# Patient Record
Sex: Female | Born: 1955 | Race: White | Hispanic: No | Marital: Single | Smoking: Current some day smoker
Health system: Southern US, Community
[De-identification: ages and names within clinical notes are randomized; demographics above are authoritative.]

## PROBLEM LIST (undated history)

## (undated) DIAGNOSIS — E039 Hypothyroidism, unspecified: Secondary | ICD-10-CM

## (undated) DIAGNOSIS — F419 Anxiety disorder, unspecified: Secondary | ICD-10-CM

## (undated) DIAGNOSIS — I219 Acute myocardial infarction, unspecified: Secondary | ICD-10-CM

## (undated) DIAGNOSIS — G43909 Migraine, unspecified, not intractable, without status migrainosus: Secondary | ICD-10-CM

## (undated) DIAGNOSIS — M25471 Effusion, right ankle: Secondary | ICD-10-CM

## (undated) DIAGNOSIS — K8020 Calculus of gallbladder without cholecystitis without obstruction: Secondary | ICD-10-CM

## (undated) DIAGNOSIS — M25472 Effusion, left ankle: Secondary | ICD-10-CM

## (undated) DIAGNOSIS — I341 Nonrheumatic mitral (valve) prolapse: Secondary | ICD-10-CM

## (undated) DIAGNOSIS — F329 Major depressive disorder, single episode, unspecified: Secondary | ICD-10-CM

## (undated) DIAGNOSIS — I83892 Varicose veins of left lower extremities with other complications: Secondary | ICD-10-CM

## (undated) DIAGNOSIS — F32A Depression, unspecified: Secondary | ICD-10-CM

## (undated) DIAGNOSIS — M1990 Unspecified osteoarthritis, unspecified site: Secondary | ICD-10-CM

## (undated) DIAGNOSIS — K52831 Collagenous colitis: Secondary | ICD-10-CM

## (undated) DIAGNOSIS — N200 Calculus of kidney: Secondary | ICD-10-CM

## (undated) DIAGNOSIS — J189 Pneumonia, unspecified organism: Secondary | ICD-10-CM

## (undated) DIAGNOSIS — T7840XA Allergy, unspecified, initial encounter: Secondary | ICD-10-CM

## (undated) DIAGNOSIS — Z87442 Personal history of urinary calculi: Secondary | ICD-10-CM

## (undated) HISTORY — PX: BARTHOLIN GLAND CYST EXCISION: SHX565

## (undated) HISTORY — DX: Pneumonia, unspecified organism: J18.9

## (undated) HISTORY — DX: Hypothyroidism, unspecified: E03.9

## (undated) HISTORY — PX: UPPER GI ENDOSCOPY: SHX6162

## (undated) HISTORY — DX: Depression, unspecified: F32.A

## (undated) HISTORY — DX: Anxiety disorder, unspecified: F41.9

## (undated) HISTORY — DX: Migraine, unspecified, not intractable, without status migrainosus: G43.909

## (undated) HISTORY — DX: Unspecified osteoarthritis, unspecified site: M19.90

## (undated) HISTORY — DX: Allergy, unspecified, initial encounter: T78.40XA

## (undated) HISTORY — PX: COLONOSCOPY: SHX174

## (undated) HISTORY — PX: SALPINGECTOMY: SHX328

---
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
## 2004-06-06 HISTORY — PX: TOTAL ABDOMINAL HYSTERECTOMY: SHX209

## 2016-06-06 HISTORY — PX: HIP RESURFACING: SHX1760

## 2018-01-19 DIAGNOSIS — Z760 Encounter for issue of repeat prescription: Secondary | ICD-10-CM | POA: Diagnosis not present

## 2018-03-29 DIAGNOSIS — M7502 Adhesive capsulitis of left shoulder: Secondary | ICD-10-CM | POA: Diagnosis not present

## 2018-03-29 DIAGNOSIS — M25512 Pain in left shoulder: Secondary | ICD-10-CM | POA: Diagnosis not present

## 2018-04-10 DIAGNOSIS — M542 Cervicalgia: Secondary | ICD-10-CM | POA: Diagnosis not present

## 2018-04-10 DIAGNOSIS — M7502 Adhesive capsulitis of left shoulder: Secondary | ICD-10-CM | POA: Diagnosis not present

## 2018-04-21 DIAGNOSIS — M542 Cervicalgia: Secondary | ICD-10-CM | POA: Diagnosis not present

## 2018-04-26 DIAGNOSIS — M542 Cervicalgia: Secondary | ICD-10-CM | POA: Diagnosis not present

## 2018-04-26 DIAGNOSIS — M7502 Adhesive capsulitis of left shoulder: Secondary | ICD-10-CM | POA: Diagnosis not present

## 2018-05-04 DIAGNOSIS — N132 Hydronephrosis with renal and ureteral calculous obstruction: Secondary | ICD-10-CM | POA: Diagnosis not present

## 2018-05-04 DIAGNOSIS — R109 Unspecified abdominal pain: Secondary | ICD-10-CM | POA: Diagnosis not present

## 2018-05-04 DIAGNOSIS — N200 Calculus of kidney: Secondary | ICD-10-CM | POA: Diagnosis not present

## 2018-05-04 DIAGNOSIS — Z79899 Other long term (current) drug therapy: Secondary | ICD-10-CM | POA: Diagnosis not present

## 2018-05-16 DIAGNOSIS — M7502 Adhesive capsulitis of left shoulder: Secondary | ICD-10-CM | POA: Diagnosis not present

## 2018-05-24 DIAGNOSIS — M542 Cervicalgia: Secondary | ICD-10-CM | POA: Diagnosis not present

## 2018-05-24 DIAGNOSIS — M25512 Pain in left shoulder: Secondary | ICD-10-CM | POA: Diagnosis not present

## 2018-05-24 DIAGNOSIS — M7502 Adhesive capsulitis of left shoulder: Secondary | ICD-10-CM | POA: Diagnosis not present

## 2018-06-04 DIAGNOSIS — M25512 Pain in left shoulder: Secondary | ICD-10-CM | POA: Diagnosis not present

## 2018-06-05 DIAGNOSIS — M5030 Other cervical disc degeneration, unspecified cervical region: Secondary | ICD-10-CM | POA: Diagnosis not present

## 2018-06-14 DIAGNOSIS — M7502 Adhesive capsulitis of left shoulder: Secondary | ICD-10-CM | POA: Diagnosis not present

## 2018-06-14 DIAGNOSIS — M7542 Impingement syndrome of left shoulder: Secondary | ICD-10-CM | POA: Diagnosis not present

## 2018-09-28 DIAGNOSIS — E079 Disorder of thyroid, unspecified: Secondary | ICD-10-CM | POA: Diagnosis not present

## 2018-09-28 DIAGNOSIS — G43009 Migraine without aura, not intractable, without status migrainosus: Secondary | ICD-10-CM | POA: Diagnosis not present

## 2019-02-05 ENCOUNTER — Encounter: Payer: Self-pay | Admitting: Gastroenterology

## 2019-02-05 DIAGNOSIS — E039 Hypothyroidism, unspecified: Secondary | ICD-10-CM | POA: Diagnosis not present

## 2019-02-05 DIAGNOSIS — F411 Generalized anxiety disorder: Secondary | ICD-10-CM | POA: Diagnosis not present

## 2019-02-05 DIAGNOSIS — G43909 Migraine, unspecified, not intractable, without status migrainosus: Secondary | ICD-10-CM | POA: Diagnosis not present

## 2019-02-05 DIAGNOSIS — F331 Major depressive disorder, recurrent, moderate: Secondary | ICD-10-CM | POA: Diagnosis not present

## 2019-02-20 ENCOUNTER — Encounter (INDEPENDENT_AMBULATORY_CARE_PROVIDER_SITE_OTHER): Payer: Self-pay

## 2019-02-20 ENCOUNTER — Ambulatory Visit: Payer: BC Managed Care – PPO | Admitting: Gastroenterology

## 2019-02-20 ENCOUNTER — Encounter: Payer: Self-pay | Admitting: Gastroenterology

## 2019-02-20 ENCOUNTER — Other Ambulatory Visit (INDEPENDENT_AMBULATORY_CARE_PROVIDER_SITE_OTHER): Payer: BC Managed Care – PPO

## 2019-02-20 VITALS — BP 90/60 | HR 88 | Temp 97.7°F | Ht 68.5 in | Wt 113.1 lb

## 2019-02-20 DIAGNOSIS — K529 Noninfective gastroenteritis and colitis, unspecified: Secondary | ICD-10-CM | POA: Diagnosis not present

## 2019-02-20 DIAGNOSIS — K52831 Collagenous colitis: Secondary | ICD-10-CM | POA: Diagnosis not present

## 2019-02-20 LAB — COMPREHENSIVE METABOLIC PANEL
ALT: 10 U/L (ref 0–35)
AST: 13 U/L (ref 0–37)
Albumin: 4.5 g/dL (ref 3.5–5.2)
Alkaline Phosphatase: 76 U/L (ref 39–117)
BUN: 11 mg/dL (ref 6–23)
CO2: 20 mEq/L (ref 19–32)
Calcium: 9.4 mg/dL (ref 8.4–10.5)
Chloride: 108 mEq/L (ref 96–112)
Creatinine, Ser: 0.77 mg/dL (ref 0.40–1.20)
GFR: 76.15 mL/min (ref >60.00)
Glucose, Bld: 73 mg/dL (ref 70–99)
Potassium: 3.5 mEq/L (ref 3.5–5.1)
Sodium: 139 mEq/L (ref 135–145)
Total Bilirubin: 0.5 mg/dL (ref 0.2–1.2)
Total Protein: 7.6 g/dL (ref 6.0–8.3)

## 2019-02-20 LAB — CBC WITH DIFFERENTIAL/PLATELET
Basophils Absolute: 0.1 10*3/uL (ref 0.0–0.1)
Basophils Relative: 1.2 % (ref 0.0–3.0)
Eosinophils Absolute: 0.1 10*3/uL (ref 0.0–0.7)
Eosinophils Relative: 0.8 % (ref 0.0–5.0)
HCT: 42.8 % (ref 36.0–46.0)
Hemoglobin: 14.5 g/dL (ref 12.0–15.0)
Lymphocytes Relative: 28 % (ref 12.0–46.0)
Lymphs Abs: 2.2 10*3/uL (ref 0.7–4.0)
MCHC: 33.8 g/dL (ref 30.0–36.0)
MCV: 99.2 fl (ref 78.0–100.0)
Monocytes Absolute: 0.7 10*3/uL (ref 0.1–1.0)
Monocytes Relative: 9.5 % (ref 3.0–12.0)
Neutro Abs: 4.7 10*3/uL (ref 1.4–7.7)
Neutrophils Relative %: 60.5 % (ref 43.0–77.0)
Platelets: 265 10*3/uL (ref 150.0–400.0)
RBC: 4.31 Mil/uL (ref 3.87–5.11)
RDW: 13.4 % (ref 11.5–15.5)
WBC: 7.7 10*3/uL (ref 4.0–10.5)

## 2019-02-20 LAB — IGA: IgA: 292 mg/dL (ref 68–378)

## 2019-02-20 MED ORDER — BUDESONIDE 3 MG PO CPEP: ORAL_CAPSULE | ORAL | 5 refills | Status: DC

## 2019-02-20 NOTE — Progress Notes (Signed)
HPI: This is a very pleasant 63 year old woman who was referred to me by Lewis Moccasinewey, Elizabeth R, MD  to evaluate chronic diarrhea, collagenous colitis.    Chief complaint is chronic diarrhea, collagenous colitis  She was diagnosed with collagenous colitis 3 or 4 years ago while [place]  She was having significant diarrhea, abdominal cramping.  She tells me she had a colonoscopy.  We do not have those results at the time of this visit.  She was started on budesonide at 3 pills once daily and did improve.  She tapered her dose over time so that she was taking 1 pill twice a week.  In the past 6 months her diarrhea has returned, she is having incontinent episodes.  Never bloody diarrhea.  She keeps a pad in her car and spare underpants and shorts because she has incontinence so often.  In the past 6 months she is taking 1 budesonide pill 3 times per week.  She has not been on antibiotics in 6 months.  She has lost about 20 pounds in the past 8 to 10 months.  She is not eating because whenever she does she will have to run to the bathroom very quickly.  No fevers or chills  Colon cancer does not run in her family  Review of systems: Pertinent positive and negative review of systems were noted in the above HPI section. All other review negative.   Past Medical History:  Diagnosis Date  . Anxiety   . Depression   . Hypothyroidism   . Migraines     History reviewed. No pertinent surgical history.  Current Outpatient Medications  Medication Sig Dispense Refill  . budesonide (ENTOCORT EC) 3 MG 24 hr capsule Take 3 mg by mouth. Three times a week    . Cholecalciferol (VITAMIN D3 PO) Take 5,000 Units by mouth daily.     . citalopram (CELEXA) 40 MG tablet Take 40 mg by mouth daily.    . cyanocobalamin 2000 MCG tablet Take 2,000 mcg by mouth daily.    [name] Kitchen. estradiol (ESTRACE) 0.5 MG tablet Take 0.5 tablets by mouth daily.    [name] Kitchen. levothyroxine (SYNTHROID) 75 MCG tablet Take  75 mcg by mouth daily before breakfast.    . Multiple Vitamin (MULTIVITAMIN) tablet Take 1 tablet by mouth daily.    [name] Kitchen. topiramate (TOPAMAX) 100 MG tablet Take 100 mg by mouth 2 (two) times daily.    [name] Kitchen. ZOLMitriptan (ZOMIG) 2.5 MG tablet Take 2.5 mg by mouth once. May repeat in 2 hours if headache persists or recurs.     No current facility-administered medications for this visit.     Allergies as of 02/20/2019 - Review Complete 02/20/2019  Allergen Reaction Noted  . Sulfa antibiotics  02/20/2019    History reviewed. No pertinent family history.  Social History   Socioeconomic History  . Marital status: Single    Spouse name: Not on file  . Number of children: Not on file  . Years of education: Not on file  . Highest education level: Not on file  Occupational History  . Not on file  Social Needs  . Financial resource strain: Not on file  . Food insecurity    Worry: Not on file    Inability: Not on file  . Transportation needs    Medical: Not on file    Non-medical: Not on file  Tobacco Use  . Smoking status: Not on file  Substance and Sexual Activity  .  Alcohol use: Not on file  . Drug use: Not on file  . Sexual activity: Not on file  Lifestyle  . Physical activity    Days per week: Not on file    Minutes per session: Not on file  . Stress: Not on file  Relationships  . Social Herbalist on phone: Not on file    Gets together: Not on file    Attends religious service: Not on file    Active member of club or organization: Not on file    Attends meetings of clubs or organizations: Not on file    Relationship status: Not on file  . Intimate partner violence    Fear of current or ex partner: Not on file    Emotionally abused: Not on file    Physically abused: Not on file    Forced sexual activity: Not on file  Other Topics Concern  . Not on file  Social History Narrative  . Not on file     Physical Exam: Temp 97.7 F (36.5 C)   Ht 5' 8.5" (1.74  m) Comment: height measured without shoes  Wt 113 lb 2 oz (51.3 kg)   BMI 16.95 kg/m  Constitutional: Cachectic Psychiatric: alert and oriented x3 Eyes: extraocular movements intact Mouth: oral pharynx moist, no lesions Neck: supple no lymphadenopathy Cardiovascular: heart regular rate and rhythm Lungs: clear to auscultation bilaterally Abdomen: soft, nontender, nondistended, no obvious ascites, no peritoneal signs, normal bowel sounds Extremities: no lower extremity edema bilaterally Skin: no lesions on visible extremities   Assessment and plan: 63 y.o. female with chronic diarrhea, history of collagenous colitis  Most likely this is uncontrolled collagenous colitis but I would like to make sure nothing else is going on with a battery of blood test and stool test.  She will get CBC, complete metabolic profile, celiac sprue serologies, GI pathogen panel.  We will send away for records from her previous gastroenterologist in [place] for review here.  I am going to start her on budesonide 3 pills once a day which is standard dose.  She will return to see me in 6 weeks and sooner if any problems.    Please see the "Patient Instructions" section for addition details about the plan.   Owens Loffler, MD [facility] 02/20/2019, 8:34 AM  Cc: Fanny Bien, MD

## 2019-02-20 NOTE — Patient Instructions (Signed)
Your provider has requested that you go to the basement level for lab work before leaving today. Press "B" on the elevator. The lab is located at the first door on the left as you exit the elevator.  We have sent the following medications to your pharmacy for you to pick up at your convenience:  Follow up on 04/02/19 at 910am  Thank you for entrusting me with your care and choosing [facility].  Dr Ardis Hughs

## 2019-02-21 DIAGNOSIS — H6090 Unspecified otitis externa, unspecified ear: Secondary | ICD-10-CM | POA: Diagnosis not present

## 2019-02-21 DIAGNOSIS — E539 Vitamin B deficiency, unspecified: Secondary | ICD-10-CM | POA: Diagnosis not present

## 2019-02-21 DIAGNOSIS — H9193 Unspecified hearing loss, bilateral: Secondary | ICD-10-CM | POA: Diagnosis not present

## 2019-02-21 DIAGNOSIS — K52831 Collagenous colitis: Secondary | ICD-10-CM | POA: Diagnosis not present

## 2019-02-21 DIAGNOSIS — Z23 Encounter for immunization: Secondary | ICD-10-CM | POA: Diagnosis not present

## 2019-02-21 DIAGNOSIS — E559 Vitamin D deficiency, unspecified: Secondary | ICD-10-CM | POA: Diagnosis not present

## 2019-02-21 DIAGNOSIS — R634 Abnormal weight loss: Secondary | ICD-10-CM | POA: Diagnosis not present

## 2019-02-21 DIAGNOSIS — E039 Hypothyroidism, unspecified: Secondary | ICD-10-CM | POA: Diagnosis not present

## 2019-02-21 LAB — TISSUE TRANSGLUTAMINASE, IGA: (tTG) Ab, IgA: 1 U/mL

## 2019-02-25 DIAGNOSIS — K52831 Collagenous colitis: Secondary | ICD-10-CM | POA: Diagnosis not present

## 2019-02-25 DIAGNOSIS — R634 Abnormal weight loss: Secondary | ICD-10-CM | POA: Diagnosis not present

## 2019-02-25 DIAGNOSIS — G43909 Migraine, unspecified, not intractable, without status migrainosus: Secondary | ICD-10-CM | POA: Diagnosis not present

## 2019-02-25 DIAGNOSIS — E039 Hypothyroidism, unspecified: Secondary | ICD-10-CM | POA: Diagnosis not present

## 2019-03-06 ENCOUNTER — Telehealth: Payer: Self-pay | Admitting: Gastroenterology

## 2019-03-06 NOTE — Telephone Encounter (Signed)
The pt has been notified of the information below.  The pt has been advised of the information and verbalized understanding.

## 2019-03-06 NOTE — Telephone Encounter (Signed)
Left message on machine to call back  

## 2019-03-06 NOTE — Telephone Encounter (Signed)
   Reviewed outside records from [name] Dr. Arnoldo Morale gastroenterologist  EGD September 2018 for melena was normal. EGD August 2016  showed mild gastritis biopsies of the stomach and duodenum were normal.  Biopsies were negative for H. pylori or celiac sprue.  Colonoscopy August 2016 for diarrhea and weight loss showed normal mucosa throughout except for scattered diverticulosis.  Pathology showed "collagenous colitis".  Colonoscopy October 2011 for lower abdominal pain and diarrhea.  Examination was essentially normal.  Biopsies were taken from the right and left colon and were all completely normal on pathology..   Can you please contact her and let her know that I reviewed her [name] records proving collagenous colitis.  She should continue with the recommendations that we outlined at her recent visit.

## 2019-04-02 ENCOUNTER — Ambulatory Visit: Payer: BC Managed Care – PPO | Admitting: Gastroenterology

## 2019-04-02 ENCOUNTER — Encounter: Payer: Self-pay | Admitting: Gastroenterology

## 2019-04-02 VITALS — BP 90/56 | HR 83 | Temp 98.3°F | Ht 68.5 in | Wt 113.6 lb

## 2019-04-02 DIAGNOSIS — K52831 Collagenous colitis: Secondary | ICD-10-CM | POA: Diagnosis not present

## 2019-04-02 MED ORDER — BUDESONIDE 3 MG PO CPEP: ORAL_CAPSULE | ORAL | 6 refills | Status: DC

## 2019-04-02 NOTE — Patient Instructions (Signed)
If you are age 61 or older, your body mass index should be between 23-30. Your Body mass index is 17.02 kg/m. If this is out of the aforementioned range listed, please consider follow up with your Primary Care Provider.  If you are age 23 or younger, your body mass index should be between 19-25. Your Body mass index is 17.02 kg/m. If this is out of the aformentioned range listed, please consider follow up with your Primary Care Provider.    We have sent the following medications to your pharmacy for you to pick up at your convenience:  You have a follow up appointment in 2 months  Thank you for entrusting me with your care and choosing [facility].  Dr Ardis Hughs

## 2019-04-02 NOTE — Progress Notes (Signed)
Review of pertinent gastrointestinal problems: 1. Collagenous colitis; colonoscopy August 2016 in [name], Dr. Arnoldo Morale for diarrhea and weight loss showed normal mucosa throughout except for scattered diverticulosis.  Random biopsies showed "collagenous colitis".  Establish care [facility] gastroenterology September 2020.  Lab tests including CBC, complete metabolic profile, celiac sprue testing, were all normal.  Started budesonide 9 mg once daily.    Other GI procedures Dr. Arnoldo Morale: EGD September 2018 for melena was normal. EGD August 2016  showed mild gastritis biopsies of the stomach and duodenum were normal.  Biopsies were negative for H. pylori or celiac sprue. Colonoscopy October 2011 for lower abdominal pain and diarrhea.  Examination was essentially normal.  Biopsies were taken from the right and left colon and were all completely normal on pathology.   HPI: This is a very pleasant 63 year old woman whom I last saw about 6 weeks ago here in the office.  At that time I started her on Entocort 9 mg once daily for previously diagnosed collagenous colitis.  She has responded very well.  The urgency is gone.  The nocturnal diarrhea is gone, the incontinence episodes are gone.  She moves her bowels 3 or 4 times a day which is actually quite improved from previously.  She does still have some cramping at times but overall she is quite happy with the results.  Chief complaint is collagenous colitis  ROS: complete GI ROS as described in HPI, all other review negative.  Constitutional:  No unintentional weight loss   Past Medical History:  Diagnosis Date  . Anxiety   . Arthritis   . Depression   . Hypothyroidism   . Kidney stones   . Migraines   . Pneumonia     Past Surgical History:  Procedure Laterality Date  . HIP RESURFACING Left 2018  . TOTAL ABDOMINAL HYSTERECTOMY  2006    Current Outpatient Medications  Medication Sig Dispense Refill  . budesonide (ENTOCORT  EC) 3 MG 24 hr capsule Take 3 tablets daily 90 capsule 5  . Cholecalciferol (VITAMIN D3 PO) Take 5,000 Units by mouth daily.     . citalopram (CELEXA) 40 MG tablet Take 40 mg by mouth daily.    . cyanocobalamin 2000 MCG tablet Take 2,000 mcg by mouth daily.    [name] Kitchen estradiol (ESTRACE) 0.5 MG tablet Take 0.5 tablets by mouth daily.    [name] Kitchen levothyroxine (SYNTHROID) 75 MCG tablet Take 75 mcg by mouth daily before breakfast.    . Multiple Vitamin (MULTIVITAMIN) tablet Take 1 tablet by mouth daily.    [name] Kitchen topiramate (TOPAMAX) 100 MG tablet Take 100 mg by mouth 2 (two) times daily.    [name] Kitchen ZOLMitriptan (ZOMIG) 2.5 MG tablet Take 2.5 mg by mouth once. May repeat in 2 hours if headache persists or recurs.     No current facility-administered medications for this visit.     Allergies as of 04/02/2019 - Review Complete 02/20/2019  Allergen Reaction Noted  . Sulfa antibiotics  02/20/2019    Family History  Problem Relation Age of Onset  . Heart disease Mother   . Dementia Mother   . Arthritis Mother   . Bladder Cancer Father   . Diabetes Father   . Heart disease Father   . Arthritis Father   . Diabetes Sister   . Arthritis Sister   . Dementia Maternal Grandmother   . Heart disease Maternal Grandfather   . Diabetes Paternal Grandfather   . Diabetes Sister   . Arthritis Sister   .  Arthritis Sister   . Dementia Maternal Aunt        x 2    Social History   Socioeconomic History  . Marital status: Single    Spouse name: Not on file  . Number of children: 0  . Years of education: Not on file  . Highest education level: Not on file  Occupational History  . Occupation: Theme park manager  . Financial resource strain: Not on file  . Food insecurity    Worry: Not on file    Inability: Not on file  . Transportation needs    Medical: Not on file    Non-medical: Not on file  Tobacco Use  . Smoking status: Former Smoker    Types: Cigarettes    Quit date: 02/20/2016    Years  since quitting: 3.1  . Smokeless tobacco: Never Used  Substance and Sexual Activity  . Alcohol use: Yes    Comment: rarely on occasions  . Drug use: Not on file  . Sexual activity: Not on file  Lifestyle  . Physical activity    Days per week: Not on file    Minutes per session: Not on file  . Stress: Not on file  Relationships  . Social Musician on phone: Not on file    Gets together: Not on file    Attends religious service: Not on file    Active member of club or organization: Not on file    Attends meetings of clubs or organizations: Not on file    Relationship status: Not on file  . Intimate partner violence    Fear of current or ex partner: Not on file    Emotionally abused: Not on file    Physically abused: Not on file    Forced sexual activity: Not on file  Other Topics Concern  . Not on file  Social History Narrative  . Not on file     Physical Exam: BP (!) 90/56   Pulse 83   Temp 98.3 F (36.8 C)   Ht 5' 8.5" (1.74 m)   Wt 113 lb 9.6 oz (51.5 kg)   BMI 17.02 kg/m  Constitutional: generally well-appearing Psychiatric: alert and oriented x3 Abdomen: soft, nontender, nondistended, no obvious ascites, no peritoneal signs, normal bowel sounds No peripheral edema noted in lower extremities  Assessment and plan: 63 y.o. female with collagenous colitis  She has responded quite well to budesonide orally.  I recommended she taper from 9 mg to 6 mg daily starting today.  She will return to see me in 2 months.  I am hoping that some of her other lower GI issues such as cramping and intermittent abdominal discomfort improve with longer duration of remission.  Also if she is doing well I will plan to cut back her steroids to 3 mg once daily at that point.  I see no reason for any further blood tests or imaging studies.  Please see the "Patient Instructions" section for addition details about the plan.  Rob Bunting, MD [facility] 04/02/2019, 9:20 AM

## 2019-04-30 DIAGNOSIS — R634 Abnormal weight loss: Secondary | ICD-10-CM | POA: Diagnosis not present

## 2019-04-30 DIAGNOSIS — E039 Hypothyroidism, unspecified: Secondary | ICD-10-CM | POA: Diagnosis not present

## 2019-04-30 DIAGNOSIS — G43909 Migraine, unspecified, not intractable, without status migrainosus: Secondary | ICD-10-CM | POA: Diagnosis not present

## 2019-05-08 DIAGNOSIS — G43909 Migraine, unspecified, not intractable, without status migrainosus: Secondary | ICD-10-CM | POA: Diagnosis not present

## 2019-05-08 DIAGNOSIS — F411 Generalized anxiety disorder: Secondary | ICD-10-CM | POA: Diagnosis not present

## 2019-05-08 DIAGNOSIS — E039 Hypothyroidism, unspecified: Secondary | ICD-10-CM | POA: Diagnosis not present

## 2019-05-08 DIAGNOSIS — R634 Abnormal weight loss: Secondary | ICD-10-CM | POA: Diagnosis not present

## 2019-05-28 ENCOUNTER — Ambulatory Visit: Payer: BC Managed Care – PPO | Admitting: Gastroenterology

## 2019-09-04 ENCOUNTER — Other Ambulatory Visit: Payer: Self-pay

## 2019-09-04 ENCOUNTER — Emergency Department (HOSPITAL_COMMUNITY): Payer: 59

## 2019-09-04 ENCOUNTER — Observation Stay (HOSPITAL_COMMUNITY)
Admission: EM | Admit: 2019-09-04 | Discharge: 2019-09-06 | Disposition: A | Discharge status: Home / Self Care | Payer: 59 | Attending: Family Medicine | Admitting: Family Medicine

## 2019-09-04 ENCOUNTER — Encounter (HOSPITAL_COMMUNITY): Payer: Self-pay | Admitting: Emergency Medicine

## 2019-09-04 DIAGNOSIS — Z87442 Personal history of urinary calculi: Secondary | ICD-10-CM | POA: Insufficient documentation

## 2019-09-04 DIAGNOSIS — Z882 Allergy status to sulfonamides status: Secondary | ICD-10-CM

## 2019-09-04 DIAGNOSIS — Z9079 Acquired absence of other genital organ(s): Secondary | ICD-10-CM | POA: Insufficient documentation

## 2019-09-04 DIAGNOSIS — Z8261 Family history of arthritis: Secondary | ICD-10-CM

## 2019-09-04 DIAGNOSIS — E2839 Other primary ovarian failure: Secondary | ICD-10-CM | POA: Insufficient documentation

## 2019-09-04 DIAGNOSIS — E871 Hypo-osmolality and hyponatremia: Secondary | ICD-10-CM | POA: Diagnosis present

## 2019-09-04 DIAGNOSIS — Z79899 Other long term (current) drug therapy: Secondary | ICD-10-CM | POA: Diagnosis not present

## 2019-09-04 DIAGNOSIS — R197 Diarrhea, unspecified: Secondary | ICD-10-CM | POA: Insufficient documentation

## 2019-09-04 DIAGNOSIS — R7401 Elevation of levels of liver transaminase levels: Secondary | ICD-10-CM | POA: Diagnosis present

## 2019-09-04 DIAGNOSIS — A084 Viral intestinal infection, unspecified: Secondary | ICD-10-CM

## 2019-09-04 DIAGNOSIS — Z79890 Hormone replacement therapy (postmenopausal): Secondary | ICD-10-CM | POA: Insufficient documentation

## 2019-09-04 DIAGNOSIS — R748 Abnormal levels of other serum enzymes: Secondary | ICD-10-CM | POA: Diagnosis not present

## 2019-09-04 DIAGNOSIS — E861 Hypovolemia: Secondary | ICD-10-CM | POA: Diagnosis present

## 2019-09-04 DIAGNOSIS — Z20822 Contact with and (suspected) exposure to covid-19: Secondary | ICD-10-CM | POA: Insufficient documentation

## 2019-09-04 DIAGNOSIS — Z66 Do not resuscitate: Secondary | ICD-10-CM | POA: Diagnosis not present

## 2019-09-04 DIAGNOSIS — R112 Nausea with vomiting, unspecified: Principal | ICD-10-CM | POA: Insufficient documentation

## 2019-09-04 DIAGNOSIS — Z8619 Personal history of other infectious and parasitic diseases: Secondary | ICD-10-CM | POA: Diagnosis not present

## 2019-09-04 DIAGNOSIS — F419 Anxiety disorder, unspecified: Secondary | ICD-10-CM | POA: Diagnosis not present

## 2019-09-04 DIAGNOSIS — Z90710 Acquired absence of both cervix and uterus: Secondary | ICD-10-CM | POA: Insufficient documentation

## 2019-09-04 DIAGNOSIS — K8020 Calculus of gallbladder without cholecystitis without obstruction: Secondary | ICD-10-CM | POA: Diagnosis present

## 2019-09-04 DIAGNOSIS — Z87891 Personal history of nicotine dependence: Secondary | ICD-10-CM | POA: Insufficient documentation

## 2019-09-04 DIAGNOSIS — E039 Hypothyroidism, unspecified: Secondary | ICD-10-CM | POA: Insufficient documentation

## 2019-09-04 DIAGNOSIS — A0811 Acute gastroenteropathy due to Norwalk agent: Secondary | ICD-10-CM | POA: Insufficient documentation

## 2019-09-04 DIAGNOSIS — K529 Noninfective gastroenteritis and colitis, unspecified: Secondary | ICD-10-CM

## 2019-09-04 DIAGNOSIS — E860 Dehydration: Secondary | ICD-10-CM | POA: Insufficient documentation

## 2019-09-04 DIAGNOSIS — D7589 Other specified diseases of blood and blood-forming organs: Secondary | ICD-10-CM | POA: Diagnosis not present

## 2019-09-04 DIAGNOSIS — R109 Unspecified abdominal pain: Secondary | ICD-10-CM | POA: Diagnosis not present

## 2019-09-04 DIAGNOSIS — E876 Hypokalemia: Secondary | ICD-10-CM | POA: Insufficient documentation

## 2019-09-04 DIAGNOSIS — G43909 Migraine, unspecified, not intractable, without status migrainosus: Secondary | ICD-10-CM | POA: Diagnosis not present

## 2019-09-04 DIAGNOSIS — N179 Acute kidney failure, unspecified: Secondary | ICD-10-CM | POA: Insufficient documentation

## 2019-09-04 DIAGNOSIS — F329 Major depressive disorder, single episode, unspecified: Secondary | ICD-10-CM | POA: Diagnosis not present

## 2019-09-04 DIAGNOSIS — M1990 Unspecified osteoarthritis, unspecified site: Secondary | ICD-10-CM | POA: Diagnosis present

## 2019-09-04 DIAGNOSIS — Z7952 Long term (current) use of systemic steroids: Secondary | ICD-10-CM

## 2019-09-04 LAB — COMPREHENSIVE METABOLIC PANEL
ALT: 88 U/L — ABNORMAL HIGH (ref 0–44)
AST: 96 U/L — ABNORMAL HIGH (ref 15–41)
Albumin: 4.6 g/dL (ref 3.5–5.0)
Alkaline Phosphatase: 66 U/L (ref 38–126)
Anion gap: 14 (ref 5–15)
BUN: 32 mg/dL — ABNORMAL HIGH (ref 8–23)
CO2: 19 mmol/L — ABNORMAL LOW (ref 22–32)
Calcium: 8.6 mg/dL — ABNORMAL LOW (ref 8.9–10.3)
Chloride: 101 mmol/L (ref 98–111)
Creatinine, Ser: 1.27 mg/dL — ABNORMAL HIGH (ref 0.44–1.00)
GFR calc Af Amer: 53 mL/min — ABNORMAL LOW (ref >60)
GFR calc non Af Amer: 46 mL/min — ABNORMAL LOW (ref >60)
Glucose, Bld: 119 mg/dL — ABNORMAL HIGH (ref 70–99)
Potassium: 2.9 mmol/L — ABNORMAL LOW (ref 3.5–5.1)
Sodium: 134 mmol/L — ABNORMAL LOW (ref 135–145)
Total Bilirubin: 1 mg/dL (ref 0.3–1.2)
Total Protein: 8.2 g/dL — ABNORMAL HIGH (ref 6.5–8.1)

## 2019-09-04 LAB — CBC
HCT: 48.4 % — ABNORMAL HIGH (ref 36.0–46.0)
Hemoglobin: 16.1 g/dL — ABNORMAL HIGH (ref 12.0–15.0)
MCH: 33.1 pg (ref 26.0–34.0)
MCHC: 33.3 g/dL (ref 30.0–36.0)
MCV: 99.6 fL (ref 80.0–100.0)
Platelets: 220 10*3/uL (ref 150–400)
RBC: 4.86 MIL/uL (ref 3.87–5.11)
RDW: 12.3 % (ref 11.5–15.5)
WBC: 5.4 10*3/uL (ref 4.0–10.5)
nRBC: 0 % (ref 0.0–0.2)

## 2019-09-04 LAB — LIPASE, BLOOD: Lipase: 26 U/L (ref 11–51)

## 2019-09-04 LAB — C DIFFICILE QUICK SCREEN W PCR REFLEX
C Diff antigen: NEGATIVE
C Diff interpretation: NOT DETECTED
C Diff toxin: NEGATIVE

## 2019-09-04 LAB — URINALYSIS, ROUTINE W REFLEX MICROSCOPIC
Bacteria, UA: NONE SEEN
Bilirubin Urine: NEGATIVE
Glucose, UA: NEGATIVE mg/dL
Ketones, ur: NEGATIVE mg/dL
Leukocytes,Ua: NEGATIVE
Nitrite: NEGATIVE
Protein, ur: NEGATIVE mg/dL
Specific Gravity, Urine: 1.03 (ref 1.005–1.030)
pH: 6 (ref 5.0–8.0)

## 2019-09-04 LAB — MAGNESIUM: Magnesium: 2.3 mg/dL (ref 1.7–2.4)

## 2019-09-04 LAB — LACTIC ACID, PLASMA: Lactic Acid, Venous: 2 mmol/L (ref 0.5–1.9)

## 2019-09-04 LAB — HIV ANTIBODY (ROUTINE TESTING W REFLEX): HIV Screen 4th Generation wRfx: NONREACTIVE

## 2019-09-04 MED ORDER — ONDANSETRON HCL 4 MG/2ML IJ SOLN
4.0000 mg | Freq: Once | INTRAMUSCULAR | Status: AC
  Administered 2019-09-04: 4 mg via INTRAVENOUS
  Filled 2019-09-04: qty 2

## 2019-09-04 MED ORDER — POTASSIUM CHLORIDE 10 MEQ/100ML IV SOLN
10.0000 meq | INTRAVENOUS | Status: AC
  Administered 2019-09-04 (×3): 10 meq via INTRAVENOUS
  Filled 2019-09-04 (×3): qty 100

## 2019-09-04 MED ORDER — ONDANSETRON HCL 4 MG/2ML IJ SOLN
4.0000 mg | Freq: Four times a day (QID) | INTRAMUSCULAR | Status: DC | PRN
  Administered 2019-09-05 – 2019-09-06 (×2): 4 mg via INTRAVENOUS
  Filled 2019-09-04 (×2): qty 2

## 2019-09-04 MED ORDER — ACETAMINOPHEN 650 MG RE SUPP: 650.0000 mg | Freq: Four times a day (QID) | RECTAL | Status: DC | PRN

## 2019-09-04 MED ORDER — BUDESONIDE 3 MG PO CPEP
6.0000 mg | ORAL_CAPSULE | Freq: Every day | ORAL | Status: DC
  Administered 2019-09-04 – 2019-09-06 (×3): 6 mg via ORAL
  Filled 2019-09-04 (×3): qty 2

## 2019-09-04 MED ORDER — SODIUM CHLORIDE (PF) 0.9 % IJ SOLN
INTRAMUSCULAR | Status: AC
  Filled 2019-09-04: qty 50

## 2019-09-04 MED ORDER — ENOXAPARIN SODIUM 40 MG/0.4ML ~~LOC~~ SOLN
40.0000 mg | SUBCUTANEOUS | Status: DC
  Administered 2019-09-04 – 2019-09-05 (×2): 40 mg via SUBCUTANEOUS
  Filled 2019-09-04 (×2): qty 0.4

## 2019-09-04 MED ORDER — HYDROCODONE-ACETAMINOPHEN 5-325 MG PO TABS
1.0000 | ORAL_TABLET | ORAL | Status: DC | PRN
  Administered 2019-09-04 – 2019-09-06 (×4): 1 via ORAL
  Filled 2019-09-04 (×4): qty 1

## 2019-09-04 MED ORDER — SODIUM CHLORIDE 0.9 % IV SOLN: INTRAVENOUS | Status: DC

## 2019-09-04 MED ORDER — LEVOTHYROXINE SODIUM 75 MCG PO TABS
75.0000 ug | ORAL_TABLET | Freq: Every day | ORAL | Status: DC
  Administered 2019-09-05 – 2019-09-06 (×2): 75 ug via ORAL
  Filled 2019-09-04 (×2): qty 1

## 2019-09-04 MED ORDER — TOPIRAMATE 25 MG PO TABS
50.0000 mg | ORAL_TABLET | Freq: Two times a day (BID) | ORAL | Status: DC
  Administered 2019-09-04 – 2019-09-06 (×4): 50 mg via ORAL
  Filled 2019-09-04 (×4): qty 2

## 2019-09-04 MED ORDER — CIPROFLOXACIN IN D5W 400 MG/200ML IV SOLN
400.0000 mg | Freq: Once | INTRAVENOUS | Status: DC
  Filled 2019-09-04: qty 200

## 2019-09-04 MED ORDER — SODIUM CHLORIDE 0.9% FLUSH
3.0000 mL | Freq: Once | INTRAVENOUS | Status: AC
  Administered 2019-09-04: 3 mL via INTRAVENOUS

## 2019-09-04 MED ORDER — METRONIDAZOLE IN NACL 5-0.79 MG/ML-% IV SOLN
500.0000 mg | Freq: Once | INTRAVENOUS | Status: DC
  Filled 2019-09-04: qty 100

## 2019-09-04 MED ORDER — ONDANSETRON HCL 4 MG PO TABS: 4.0000 mg | ORAL_TABLET | Freq: Four times a day (QID) | ORAL | Status: DC | PRN

## 2019-09-04 MED ORDER — POTASSIUM CHLORIDE CRYS ER 20 MEQ PO TBCR
40.0000 meq | EXTENDED_RELEASE_TABLET | Freq: Once | ORAL | Status: AC
  Administered 2019-09-04: 40 meq via ORAL
  Filled 2019-09-04: qty 2

## 2019-09-04 MED ORDER — ESTRADIOL 0.5 MG PO TABS: 0.2500 mg | ORAL_TABLET | Freq: Every day | ORAL | Status: DC

## 2019-09-04 MED ORDER — SODIUM CHLORIDE 0.9 % IV BOLUS
1000.0000 mL | Freq: Once | INTRAVENOUS | Status: AC
  Administered 2019-09-04: 1000 mL via INTRAVENOUS

## 2019-09-04 MED ORDER — CITALOPRAM HYDROBROMIDE 20 MG PO TABS
40.0000 mg | ORAL_TABLET | Freq: Every day | ORAL | Status: DC
  Administered 2019-09-04 – 2019-09-06 (×3): 40 mg via ORAL
  Filled 2019-09-04 (×3): qty 2

## 2019-09-04 MED ORDER — ACETAMINOPHEN 325 MG PO TABS: 650.0000 mg | ORAL_TABLET | Freq: Four times a day (QID) | ORAL | Status: DC | PRN

## 2019-09-04 MED ORDER — IOHEXOL 300 MG/ML  SOLN
100.0000 mL | Freq: Once | INTRAMUSCULAR | Status: AC | PRN
  Administered 2019-09-04: 80 mL via INTRAVENOUS

## 2019-09-04 NOTE — ED Notes (Signed)
Pt transported to CT. Unable to start IV K at this time due to no availably of channels.

## 2019-09-04 NOTE — ED Notes (Signed)
Hospitalist at bedside 

## 2019-09-04 NOTE — ED Provider Notes (Signed)
[facility]-EMERGENCY DEPT Provider Note   CSN: [id number] Arrival date & time: 09/04/19  1356     History Chief Complaint  Patient presents with  . Nausea  . Emesis  . Abdominal Pain  . Diarrhea    Angelica Stone is a 64 y.o. female.  HPI   64 yo female presents today complaiining of n/v/d that began on Monday f/b some abdominal pain.  Today vomiting resolved but continues to have loose stools. Vomited yesterday x 5-mostly watery. Diarrhea multiple times and liquid. One episode of subjective fever, some chills No sick contacts.  Denies similar episodes. Previous diagnosis of collagenous colitis during which time patient had some diarrhea as her main symptoms.      Past Medical History:  Diagnosis Date  . Anxiety   . Arthritis   . Depression   . Hypothyroidism   . Kidney stones   . Migraines   . Pneumonia     There are no problems to display for this patient.   Past Surgical History:  Procedure Laterality Date  . HIP RESURFACING Left 2018  . TOTAL ABDOMINAL HYSTERECTOMY  2006     OB History   No obstetric history on file.     Family History  Problem Relation Age of Onset  . Heart disease Mother   . Dementia Mother   . Arthritis Mother   . Bladder Cancer Father   . Diabetes Father   . Heart disease Father   . Arthritis Father   . Diabetes Sister   . Arthritis Sister   . Dementia Maternal Grandmother   . Heart disease Maternal Grandfather   . Diabetes Paternal Grandfather   . Diabetes Sister   . Arthritis Sister   . Arthritis Sister   . Dementia Maternal Aunt        x 2    Social History   Tobacco Use  . Smoking status: Former Smoker    Types: Cigarettes    Quit date: 02/20/2016    Years since quitting: 3.5  . Smokeless tobacco: Never Used  Substance Use Topics  . Alcohol use: Yes    Comment: rarely on occasions  . Drug use: Not on file    Home Medications Prior to Admission medications   Medication Sig Start Date  End Date Taking? Authorizing Provider  budesonide (ENTOCORT EC) 3 MG 24 hr capsule Take two tabs daily 04/02/19   Rachael Fee, MD  Cholecalciferol (VITAMIN D3 PO) Take 5,000 Units by mouth daily.     [provider]  citalopram (CELEXA) 40 MG tablet Take 40 mg by mouth daily.    [provider]  cyanocobalamin 2000 MCG tablet Take 2,000 mcg by mouth daily.    [provider]  estradiol (ESTRACE) 0.5 MG tablet Take 0.5 tablets by mouth daily. 02/05/19   [provider]  levothyroxine (SYNTHROID) 75 MCG tablet Take 75 mcg by mouth daily before breakfast.    [provider]  Multiple Vitamin (MULTIVITAMIN) tablet Take 1 tablet by mouth daily.    [provider]  topiramate (TOPAMAX) 100 MG tablet Take 100 mg by mouth 2 (two) times daily.    [provider]  ZOLMitriptan (ZOMIG) 2.5 MG tablet Take 2.5 mg by mouth once. May repeat in 2 hours if headache persists or recurs.    [provider]    Allergies    Sulfa antibiotics  Review of Systems   Review of Systems  All other systems reviewed  and are negative.   Physical Exam Updated Vital Signs BP 92/66 (BP Location: Left Arm)   Pulse (!) 105   Temp 97.6 F (36.4 C) (Oral)   Resp 18   SpO2 95%   Physical Exam Vitals and nursing note reviewed.  Constitutional:      Appearance: She is well-developed.  HENT:     Head: Normocephalic.     Mouth/Throat:     Pharynx: Oropharynx is clear. No oropharyngeal exudate.  Eyes:     Extraocular Movements: Extraocular movements intact.     Pupils: Pupils are equal, round, and reactive to light.  Cardiovascular:     Rate and Rhythm: Normal rate.  Abdominal:     General: Abdomen is flat. Bowel sounds are decreased.     Palpations: Abdomen is soft.     Comments: Mild diffuse ttp  Skin:    General: Skin is warm and dry.     Capillary Refill: Capillary refill takes less than 2 seconds.  Neurological:     General: No  focal deficit present.     Mental Status: She is alert.  Psychiatric:        Mood and Affect: Mood normal.     ED Results / Procedures / Treatments   Labs (all labs ordered are listed, but only abnormal results are displayed) Labs Reviewed  LIPASE, BLOOD  COMPREHENSIVE METABOLIC PANEL  CBC  URINALYSIS, ROUTINE W REFLEX MICROSCOPIC    EKG None  Radiology CT ABDOMEN PELVIS W CONTRAST  Result Date: 09/04/2019 CLINICAL DATA:  Acute abdominal pain, nausea, vomiting EXAM: CT ABDOMEN AND PELVIS WITH CONTRAST TECHNIQUE: Multidetector CT imaging of the abdomen and pelvis was performed using the standard protocol following bolus administration of intravenous contrast. CONTRAST:  57mL OMNIPAQUE IOHEXOL 300 MG/ML  SOLN COMPARISON:  None. FINDINGS: Lower chest: No acute abnormality. Hepatobiliary: No focal liver abnormality. Single 5 mm calcified stone within the gallbladder lumen. No pericholecystic inflammatory changes are evident by CT. No biliary dilatation. Pancreas: Unremarkable. No pancreatic ductal dilatation or surrounding inflammatory changes. Spleen: Normal in size without focal abnormality. Adrenals/Urinary Tract: Unremarkable adrenal glands. 6 mm calcification within the upper pole of the right kidney. No hydronephrosis. Left kidney appears unremarkable. Evaluation of the distal ureters and urinary bladder is degraded by extensive streak artifact from left hip orthopedic hardware. Stomach/Bowel: Colon is largely fluid-filled. No focal colonic thickening or pericolonic inflammatory changes are seen. An air-filled appendix is present in the right lower quadrant. No dilated loops of small bowel. Vascular/Lymphatic: Aortic atherosclerosis without aneurysm. No abdominopelvic lymphadenopathy is seen. Reproductive: Status post hysterectomy. No adnexal masses. Other: No free air, free fluid, or abdominal wall hernia. Musculoskeletal: Left femoral head hardware with extensive metallic streak artifact.  No acute osseous findings. IMPRESSION: 1. Fluid distended colon, which can be seen with an infectious or inflammatory colitis. No focal colonic thickening or pericolonic inflammatory changes are seen. 2. Cholelithiasis without CT evidence for acute cholecystitis. 3. Nonobstructing right-sided nephrolithiasis. Aortic Atherosclerosis (ICD10-I70.0). Electronically Signed   By: Duanne Guess D.O.   On: 09/04/2019 16:51    Procedures .Critical Care Performed by: Margarita Grizzle, MD Authorized by: Margarita Grizzle, MD   Critical care provider statement:    Critical care time (minutes):  45   Critical care end time:  09/04/2019 5:06 PM   Critical care was time spent personally by me on the following activities:  Discussions with consultants, evaluation of patient's response to treatment, examination of patient, ordering and performing treatments and interventions,  ordering and review of laboratory studies, ordering and review of radiographic studies, pulse oximetry, re-evaluation of patient's condition, obtaining history from patient or surrogate and review of old charts   (including critical care time)  Medications Ordered in ED Medications  sodium chloride flush (NS) 0.9 % injection 3 mL (has no administration in time range)    ED Course  I have reviewed the triage vital signs and the nursing notes.  Pertinent labs & imaging results that were available during my care of the patient were reviewed by me and considered in my medical decision making (see chart for details).    MDM Rules/Calculators/A&P                     64 year old female history of collagenous colitis presents today with nausea vomiting and diarrhea.  Diffuse abdominal tenderness was assessed with labs and CT.  There is fluid-filled colon consistent with colitis.  Additionally, patient has electrolyte derangements consistent with volume depletion and gastroenteritis. Colitis will treat with Cipro and Flagyl 2 volume depletion  ongoing fluid resuscitation.  Patient's blood pressures have been systolically in the 40J.  Heart rate has been normal 3-Hypokalemia patient is receiving potassium replenishment with p.o. and IV 4 AKI Baseline creatinine 0.77 today it is 1.27 5 transaminitis with elevated AST and ALT Plan consultation to hospitalist for ongoing treatment, monitoring, and evaluation  Discussed with Dr. Alvino Chapel who will facilitate admission to hospitalist Final Clinical Impression(s) / ED Diagnoses Final diagnoses:  Colitis  Hypokalemia  AKI (acute kidney injury) [facility])    Rx / [place] Orders ED Discharge Orders    None       Pattricia Boss, MD 09/04/19 1706

## 2019-09-04 NOTE — ED Notes (Signed)
Pt ambulatory to RR independently  

## 2019-09-04 NOTE — ED Triage Notes (Signed)
Patient here from home reporting abd pain, n/v, diarrhea that started on Monday.

## 2019-09-04 NOTE — ED Notes (Signed)
CRITICAL VALUE STICKER  CRITICAL VALUE: 2.0 Lactic Acid   RECEIVER (on-site recipient of call):   DATE & TIME NOTIFIED:   MESSENGER (representative from lab): Nada Maclachlan  MD NOTIFIED: Mal Misty MD  TIME OF NOTIFICATION: today now    RESPONSE: noted

## 2019-09-04 NOTE — H&P (Signed)
History and Physical    Angelica Stone KGU:[id number] DOB: 11/26/1955 DOA: 09/04/2019  PCP: Fanny Bien, MD Patient coming from: Home  Chief Complaint: Nausea/vomiting/diarrhea  HPI: Angelica Stone is a 64 y.o. female with medical history significant of anxiety, depression, hypothyroidism, kidney stones, migraines. Patient reports a two day history of nausea, vomiting and diarrhea. She reports 4-5 stools per day consisting of watery stool. She has associated abdominal pain/cramping. She has tried to keep hydrated with electrolyte containing drinks and kaopectate but this has been complicated by nausea and vomiting. No sick contacts. No history of C. Difficile.  ED Course: Vitals: Afebrile, pulse of 95, respirations of 14, BP of 113/78, SpO2 of 93% Labs: CO2 of 19, BUN of 32, Creatinine of 1/27, Calcium of 8.6, AST/ALT of 96/88 Imaging: CT abdomen/pelvis suggests infectious/inflammatory colitis Medications/Course: 2L NS bolus, Zofran, Potassium  Review of Systems: Review of Systems  Constitutional: Positive for chills. Negative for fever.  Respiratory: Negative for cough.   Cardiovascular: Negative for chest pain.  Gastrointestinal: Positive for abdominal pain, diarrhea, nausea and vomiting. Negative for blood in stool, constipation and melena.  All other systems reviewed and are negative.   Past Medical History:  Diagnosis Date  . Anxiety   . Arthritis   . Depression   . Hypothyroidism   . Kidney stones   . Migraines   . Pneumonia     Past Surgical History:  Procedure Laterality Date  . HIP RESURFACING Left 2018  . TOTAL ABDOMINAL HYSTERECTOMY  2006     reports that she quit smoking about 3 years ago. Her smoking use included cigarettes. She has never used smokeless tobacco. She reports current alcohol use. No history on file for drug.  Allergies  Allergen Reactions  . Sulfa Antibiotics     Family History  Problem Relation Age of Onset  . Heart disease Mother   .  Dementia Mother   . Arthritis Mother   . Bladder Cancer Father   . Diabetes Father   . Heart disease Father   . Arthritis Father   . Diabetes Sister   . Arthritis Sister   . Dementia Maternal Grandmother   . Heart disease Maternal Grandfather   . Diabetes Paternal Grandfather   . Diabetes Sister   . Arthritis Sister   . Arthritis Sister   . Dementia Maternal Aunt        x 2   Prior to Admission medications   Medication Sig Start Date End Date Taking? Authorizing Provider  budesonide (ENTOCORT EC) 3 MG 24 hr capsule Take two tabs daily Patient taking differently: Take 3 mg by mouth daily. Take two tabs daily 04/02/19  Yes Milus Banister, MD  calcium carbonate (OSCAL) 1500 (600 Ca) MG TABS tablet Take 600 mg of elemental calcium by mouth daily.   Yes [provider]  Cholecalciferol (VITAMIN D3 PO) Take 5,000 Units by mouth daily.    Yes [provider]  citalopram (CELEXA) 40 MG tablet Take 40 mg by mouth daily.   Yes [provider]  cyanocobalamin 2000 MCG tablet Take 2,000 mcg by mouth daily.   Yes [provider]  estradiol (ESTRACE) 0.5 MG tablet Take 0.5 tablets by mouth daily. 02/05/19  Yes [provider]  levothyroxine (SYNTHROID) 75 MCG tablet Take 75 mcg by mouth daily before breakfast.   Yes [provider]  Multiple Vitamin (MULTIVITAMIN WITH MINERALS) TABS tablet Take 1 tablet by mouth daily. Centrum   Yes [provider]  Multiple Vitamin (MULTIVITAMIN) tablet Take 1 tablet by mouth daily.   Yes [provider]  Multiple Vitamins-Minerals (ZINC PO) Take 1 tablet by mouth daily.   Yes [provider]  topiramate (TOPAMAX) 50 MG tablet Take 50 mg by mouth 2 (two) times daily.   Yes [provider]  ZOLMitriptan (ZOMIG) 2.5 MG tablet Take 2.5 mg by mouth as needed for migraine.    Yes [provider]    Physical Exam:  Physical Exam Vitals reviewed.  Constitutional:       General: She is not in acute distress.    Appearance: She is well-developed. She is not diaphoretic.  HENT:     Mouth/Throat:     Mouth: Mucous membranes are dry.  Eyes:     Conjunctiva/sclera: Conjunctivae normal.     Pupils: Pupils are equal, round, and reactive to light.  Cardiovascular:     Rate and Rhythm: Normal rate and regular rhythm.     Heart sounds: Normal heart sounds. No murmur.  Pulmonary:     Effort: Pulmonary effort is normal. No respiratory distress.     Breath sounds: Normal breath sounds. No wheezing or rales.  Abdominal:     General: Bowel sounds are normal. There is no distension.     Palpations: Abdomen is soft.     Tenderness: There is abdominal tenderness in the suprapubic area and left lower quadrant. There is no guarding or rebound.  Musculoskeletal:        General: No tenderness. Normal range of motion.     Cervical back: Normal range of motion.  Lymphadenopathy:     Cervical: No cervical adenopathy.  Skin:    General: Skin is warm and dry.  Neurological:     Mental Status: She is alert and oriented to person, place, and time.    Labs on Admission: I have personally reviewed following labs and imaging studies  CBC: Recent Labs  Lab 09/04/19 1438  WBC 5.4  HGB 16.1*  HCT 48.4*  MCV 99.6  PLT 220    Basic Metabolic Panel: Recent Labs  Lab 09/04/19 1438  NA 134*  K 2.9*  CL 101  CO2 19*  GLUCOSE 119*  BUN 32*  CREATININE 1.27*  CALCIUM 8.6*    GFR: CrCl cannot be calculated (Unknown ideal weight.).  Liver Function Tests: Recent Labs  Lab 09/04/19 1438  AST 96*  ALT 88*  ALKPHOS 66  BILITOT 1.0  PROT 8.2*  ALBUMIN 4.6   Recent Labs  Lab 09/04/19 1438  LIPASE 26   No results for input(s): AMMONIA in the last 168 hours.  Coagulation Profile: No results for input(s): INR, PROTIME in the last 168 hours.  Cardiac Enzymes: No results for input(s): CKTOTAL, CKMB, CKMBINDEX, TROPONINI in the last 168 hours.  BNP (last  3 results) No results for input(s): PROBNP in the last 8760 hours.  HbA1C: No results for input(s): HGBA1C in the last 72 hours.  CBG: No results for input(s): GLUCAP in the last 168 hours.  Lipid Profile: No results for input(s): CHOL, HDL, LDLCALC, TRIG, CHOLHDL, LDLDIRECT in the last 72 hours.  Thyroid Function Tests: No results for input(s): TSH, T4TOTAL, FREET4, T3FREE, THYROIDAB in the last 72 hours.  Anemia Panel: No results for input(s): VITAMINB12, FOLATE, FERRITIN, TIBC, IRON, RETICCTPCT in the last 72 hours.  Urine analysis: No results found for: COLORURINE, APPEARANCEUR, LABSPEC, PHURINE, GLUCOSEU, HGBUR, BILIRUBINUR, KETONESUR, PROTEINUR, UROBILINOGEN, NITRITE, LEUKOCYTESUR   Radiological Exams on Admission: CT ABDOMEN  PELVIS W CONTRAST  Result Date: 09/04/2019 CLINICAL DATA:  Acute abdominal pain, nausea, vomiting EXAM: CT ABDOMEN AND PELVIS WITH CONTRAST TECHNIQUE: Multidetector CT imaging of the abdomen and pelvis was performed using the standard protocol following bolus administration of intravenous contrast. CONTRAST:  76mL OMNIPAQUE IOHEXOL 300 MG/ML  SOLN COMPARISON:  None. FINDINGS: Lower chest: No acute abnormality. Hepatobiliary: No focal liver abnormality. Single 5 mm calcified stone within the gallbladder lumen. No pericholecystic inflammatory changes are evident by CT. No biliary dilatation. Pancreas: Unremarkable. No pancreatic ductal dilatation or surrounding inflammatory changes. Spleen: Normal in size without focal abnormality. Adrenals/Urinary Tract: Unremarkable adrenal glands. 6 mm calcification within the upper pole of the right kidney. No hydronephrosis. Left kidney appears unremarkable. Evaluation of the distal ureters and urinary bladder is degraded by extensive streak artifact from left hip orthopedic hardware. Stomach/Bowel: Colon is largely fluid-filled. No focal colonic thickening or pericolonic inflammatory changes are seen. An air-filled appendix is  present in the right lower quadrant. No dilated loops of small bowel. Vascular/Lymphatic: Aortic atherosclerosis without aneurysm. No abdominopelvic lymphadenopathy is seen. Reproductive: Status post hysterectomy. No adnexal masses. Other: No free air, free fluid, or abdominal wall hernia. Musculoskeletal: Left femoral head hardware with extensive metallic streak artifact. No acute osseous findings. IMPRESSION: 1. Fluid distended colon, which can be seen with an infectious or inflammatory colitis. No focal colonic thickening or pericolonic inflammatory changes are seen. 2. Cholelithiasis without CT evidence for acute cholecystitis. 3. Nonobstructing right-sided nephrolithiasis. Aortic Atherosclerosis (ICD10-I70.0). Electronically Signed   By: Duanne Guess D.O.   On: 09/04/2019 16:51    EKG: Independently reviewed. Inferior T-wave changes  Assessment/Plan Principal Problem:   AKI (acute kidney injury) (HCC) Active Problems:   Nausea vomiting and diarrhea   Colitis   Migraine   Anxiety   Estrogen deficiency   AKI Baseline creatinine of 0.77. Creatinine of 1.27 on admission. Secondary to dehydration from volume loss. Given 2 L NS bolus in the ED -Continue IV fluids overnight -Oral intake as tolerated -CMP in AM  Nausea/vomiting/Diarrhea Colitis Possibly infectious vs inflammatory. Patient with a history of collagenous. Patient supposed to be taking 6 mg of budesonide. No leukocytosis of fever. -GI consulted -Hold antibiotics for now; obtain C. Difficile and GI pathogen panel  Estrogen deficiency Secondary to TAH with oophorectomy per patient. Patient is on estradiol -Continue estradiol  Hypothyroidism -Continue Synthroid 75 mcg daily  History of migraine On Topamax and Zomig as an outpatient. -Continue Topamax  Anxiety -Continue Celexa  Elevated AST/ALT Possibly secondary to infection vs hypovolemia. No liver abnormality noted on CT scan. Normal alkaline phosphatase and no  gallbladder pathology noted on CT scan. No RUQ tenderness. Patient drinks alcohol infrequently. -CMP in AM to trend   DVT prophylaxis: Lovenox Code Status: DNR Family Communication: None at bedside Disposition Plan: Telemetry, discharge home once tolerating fluids and per GI recommendations Consults called: [facility] Admission status: Observation   Jacquelin Hawking, MD [facility] 09/04/2019, 6:11 PM

## 2019-09-05 DIAGNOSIS — R7401 Elevation of levels of liver transaminase levels: Secondary | ICD-10-CM | POA: Diagnosis present

## 2019-09-05 DIAGNOSIS — Z8261 Family history of arthritis: Secondary | ICD-10-CM | POA: Diagnosis not present

## 2019-09-05 DIAGNOSIS — M1990 Unspecified osteoarthritis, unspecified site: Secondary | ICD-10-CM | POA: Diagnosis present

## 2019-09-05 DIAGNOSIS — K52831 Collagenous colitis: Secondary | ICD-10-CM | POA: Diagnosis not present

## 2019-09-05 DIAGNOSIS — K529 Noninfective gastroenteritis and colitis, unspecified: Secondary | ICD-10-CM | POA: Diagnosis not present

## 2019-09-05 DIAGNOSIS — N179 Acute kidney failure, unspecified: Secondary | ICD-10-CM | POA: Diagnosis not present

## 2019-09-05 DIAGNOSIS — F419 Anxiety disorder, unspecified: Secondary | ICD-10-CM | POA: Diagnosis not present

## 2019-09-05 DIAGNOSIS — D7589 Other specified diseases of blood and blood-forming organs: Secondary | ICD-10-CM | POA: Diagnosis not present

## 2019-09-05 DIAGNOSIS — Z87442 Personal history of urinary calculi: Secondary | ICD-10-CM | POA: Diagnosis not present

## 2019-09-05 DIAGNOSIS — Z66 Do not resuscitate: Secondary | ICD-10-CM | POA: Diagnosis present

## 2019-09-05 DIAGNOSIS — R748 Abnormal levels of other serum enzymes: Secondary | ICD-10-CM | POA: Diagnosis present

## 2019-09-05 DIAGNOSIS — E871 Hypo-osmolality and hyponatremia: Secondary | ICD-10-CM | POA: Diagnosis present

## 2019-09-05 DIAGNOSIS — E860 Dehydration: Secondary | ICD-10-CM | POA: Diagnosis present

## 2019-09-05 DIAGNOSIS — R112 Nausea with vomiting, unspecified: Secondary | ICD-10-CM | POA: Diagnosis present

## 2019-09-05 DIAGNOSIS — Z87891 Personal history of nicotine dependence: Secondary | ICD-10-CM | POA: Diagnosis not present

## 2019-09-05 DIAGNOSIS — Z79890 Hormone replacement therapy (postmenopausal): Secondary | ICD-10-CM | POA: Diagnosis not present

## 2019-09-05 DIAGNOSIS — K8020 Calculus of gallbladder without cholecystitis without obstruction: Secondary | ICD-10-CM | POA: Diagnosis present

## 2019-09-05 DIAGNOSIS — Z20822 Contact with and (suspected) exposure to covid-19: Secondary | ICD-10-CM | POA: Diagnosis present

## 2019-09-05 DIAGNOSIS — E876 Hypokalemia: Secondary | ICD-10-CM | POA: Diagnosis present

## 2019-09-05 DIAGNOSIS — F329 Major depressive disorder, single episode, unspecified: Secondary | ICD-10-CM | POA: Diagnosis present

## 2019-09-05 DIAGNOSIS — E2839 Other primary ovarian failure: Secondary | ICD-10-CM | POA: Diagnosis not present

## 2019-09-05 DIAGNOSIS — E039 Hypothyroidism, unspecified: Secondary | ICD-10-CM | POA: Diagnosis present

## 2019-09-05 DIAGNOSIS — A084 Viral intestinal infection, unspecified: Secondary | ICD-10-CM | POA: Diagnosis present

## 2019-09-05 DIAGNOSIS — E861 Hypovolemia: Secondary | ICD-10-CM | POA: Diagnosis present

## 2019-09-05 DIAGNOSIS — R7989 Other specified abnormal findings of blood chemistry: Secondary | ICD-10-CM | POA: Diagnosis not present

## 2019-09-05 DIAGNOSIS — G43909 Migraine, unspecified, not intractable, without status migrainosus: Secondary | ICD-10-CM | POA: Diagnosis present

## 2019-09-05 DIAGNOSIS — Z882 Allergy status to sulfonamides status: Secondary | ICD-10-CM | POA: Diagnosis not present

## 2019-09-05 DIAGNOSIS — Z90710 Acquired absence of both cervix and uterus: Secondary | ICD-10-CM | POA: Diagnosis not present

## 2019-09-05 LAB — COMPREHENSIVE METABOLIC PANEL
ALT: 52 U/L — ABNORMAL HIGH (ref 0–44)
AST: 46 U/L — ABNORMAL HIGH (ref 15–41)
Albumin: 3.1 g/dL — ABNORMAL LOW (ref 3.5–5.0)
Alkaline Phosphatase: 43 U/L (ref 38–126)
Anion gap: 7 (ref 5–15)
BUN: 23 mg/dL (ref 8–23)
CO2: 13 mmol/L — ABNORMAL LOW (ref 22–32)
Calcium: 7.3 mg/dL — ABNORMAL LOW (ref 8.9–10.3)
Chloride: 114 mmol/L — ABNORMAL HIGH (ref 98–111)
Creatinine, Ser: 0.72 mg/dL (ref 0.44–1.00)
GFR calc Af Amer: >60 mL/min (ref >60)
GFR calc non Af Amer: >60 mL/min (ref >60)
Glucose, Bld: 80 mg/dL (ref 70–99)
Potassium: 3.2 mmol/L — ABNORMAL LOW (ref 3.5–5.1)
Sodium: 134 mmol/L — ABNORMAL LOW (ref 135–145)
Total Bilirubin: 0.8 mg/dL (ref 0.3–1.2)
Total Protein: 5.6 g/dL — ABNORMAL LOW (ref 6.5–8.1)

## 2019-09-05 LAB — FOLATE: Folate: 21.2 ng/mL (ref >5.9)

## 2019-09-05 LAB — CBC
HCT: 35.3 % — ABNORMAL LOW (ref 36.0–46.0)
Hemoglobin: 11.7 g/dL — ABNORMAL LOW (ref 12.0–15.0)
MCH: 33.3 pg (ref 26.0–34.0)
MCHC: 33.1 g/dL (ref 30.0–36.0)
MCV: 100.6 fL — ABNORMAL HIGH (ref 80.0–100.0)
Platelets: 153 10*3/uL (ref 150–400)
RBC: 3.51 MIL/uL — ABNORMAL LOW (ref 3.87–5.11)
RDW: 12.6 % (ref 11.5–15.5)
WBC: 4.9 10*3/uL (ref 4.0–10.5)
nRBC: 0 % (ref 0.0–0.2)

## 2019-09-05 LAB — SARS CORONAVIRUS 2 (TAT 6-24 HRS): SARS Coronavirus 2: NEGATIVE

## 2019-09-05 LAB — VITAMIN B12: Vitamin B-12: 243 pg/mL (ref 180–914)

## 2019-09-05 LAB — MAGNESIUM: Magnesium: 2.2 mg/dL (ref 1.7–2.4)

## 2019-09-05 NOTE — Progress Notes (Signed)
PROGRESS NOTE    Angelica Stone  GLO:[id number] DOB: 1956-03-05 DOA: 09/04/2019 PCP: Lewis Moccasin, MD   Brief Narrative: Angelica Stone is a 64 y.o. female with medical history significant of anxiety, depression, hypothyroidism, kidney stones, migraines. Patient presented secondary to nausea and vomiting but really secondary to persistent watery diarrhea. C. Difficile negative. Afebrile. No leukocytosis.   Assessment & Plan:   Principal Problem:   AKI (acute kidney injury) (HCC) Active Problems:   Nausea vomiting and diarrhea   Colitis   Migraine   Anxiety   Estrogen deficiency   AKI Baseline creatinine of 0.77. Creatinine of 1.27 on admission. Secondary to dehydration from volume loss. Given 2 L NS bolus in the ED and started on NS IV fluids. Resolved. -Continue IV fluids -Oral intake as tolerated -CMP in AM  Nausea/vomiting/Diarrhea Colitis Possibly infectious vs inflammatory. Patient with a history of collagenous. Patient supposed to be taking 6 mg of budesonide. No leukocytosis of fever. C. Difficile negative. No antibiotics started. -GI consulted -GI pathogen panel pending  Estrogen deficiency Secondary to TAH with oophorectomy per patient. Patient is on estradiol -Continue estradiol  Hypothyroidism -Continue Synthroid 75 mcg daily  Hypokalemia Improved. In setting of fluid losses. -Supplementation  Hypocalcemia Unsure of etiology. Patient with a history of hypothyroidism but not surgical. Albumin is slightly low and correct calcium is still only about 8.0. magnesium is normal. -PTH -Phosphate  History of migraine On Topamax and Zomig as an outpatient. -Continue Topamax  Anxiety -Continue Celexa  Elevated AST/ALT Possibly secondary to infection vs hypovolemia. No liver abnormality noted on CT scan. Normal alkaline phosphatase and no gallbladder pathology noted on CT scan. No RUQ tenderness. Patient drinks alcohol infrequently. Trended down  DVT  prophylaxis: Lovenox Code Status:   Code Status: DNR Family Communication: None at bedside Disposition Plan: Discharge home once able to tolerate oral intake enough to manage fluid losses   Consultants:   [facility]  Procedures:   None  Antimicrobials:  None    Subjective: Still with 4+ stools per day. Watery although one stool was slightly formed but returned to watery stools.  Objective: Vitals:   09/04/19 2331 09/04/19 2331 09/05/19 0332 09/05/19 0925  BP:  101/67 95/62 (!) 87/60  Pulse:  87 87 72  Resp:  18 18 18   Temp:  98.2 F (36.8 C) 98.4 F (36.9 C) 98 F (36.7 C)  TempSrc:  Oral Oral Oral  SpO2:  94% 97% 100%  Weight: 54.3 kg     Height: 5\' 9"  (1.753 m)       Intake/Output Summary (Last 24 hours) at 09/05/2019 1315 Last data filed at 09/04/2019 1909 Gross per 24 hour  Intake 2100 ml  Output --  Net 2100 ml   Filed Weights   09/04/19 2331  Weight: 54.3 kg    Examination:  General exam: Appears calm and comfortable Respiratory system: Clear to auscultation. Respiratory effort normal. Cardiovascular system: S1 & S2 heard, RRR. No murmurs, rubs, gallops or clicks. Gastrointestinal system: Abdomen is nondistended, soft and mildly tender in suprapubic area. No organomegaly or masses felt. Normal bowel sounds heard. Central nervous system: Alert and oriented. No focal neurological deficits. Extremities: No edema. No calf tenderness Skin: No cyanosis. No rashes Psychiatry: Judgement and insight appear normal. Mood & affect appropriate.     Data Reviewed: I have personally reviewed following labs and imaging studies  CBC: Recent Labs  Lab 09/04/19 1438 09/05/19 0508  WBC 5.4 4.9  HGB 16.1* 11.7*  HCT 48.4* 35.3*  MCV 99.6 100.6*  PLT 220 751   Basic Metabolic Panel: Recent Labs  Lab 09/04/19 1438 09/05/19 0508  NA 134* 134*  K 2.9* 3.2*  CL 101 114*  CO2 19* 13*  GLUCOSE 119* 80  BUN 32* 23  CREATININE 1.27* 0.72  CALCIUM 8.6* 7.3*   MG 2.3 2.2   GFR: Estimated Creatinine Clearance: 63.3 mL/min (by C-G formula based on SCr of 0.72 mg/dL). Liver Function Tests: Recent Labs  Lab 09/04/19 1438 09/05/19 0508  AST 96* 46*  ALT 88* 52*  ALKPHOS 66 43  BILITOT 1.0 0.8  PROT 8.2* 5.6*  ALBUMIN 4.6 3.1*   Recent Labs  Lab 09/04/19 1438  LIPASE 26   No results for input(s): AMMONIA in the last 168 hours. Coagulation Profile: No results for input(s): INR, PROTIME in the last 168 hours. Cardiac Enzymes: No results for input(s): CKTOTAL, CKMB, CKMBINDEX, TROPONINI in the last 168 hours. BNP (last 3 results) No results for input(s): PROBNP in the last 8760 hours. HbA1C: No results for input(s): HGBA1C in the last 72 hours. CBG: No results for input(s): GLUCAP in the last 168 hours. Lipid Profile: No results for input(s): CHOL, HDL, LDLCALC, TRIG, CHOLHDL, LDLDIRECT in the last 72 hours. Thyroid Function Tests: No results for input(s): TSH, T4TOTAL, FREET4, T3FREE, THYROIDAB in the last 72 hours. Anemia Panel: No results for input(s): VITAMINB12, FOLATE, FERRITIN, TIBC, IRON, RETICCTPCT in the last 72 hours. Sepsis Labs: Recent Labs  Lab 09/04/19 1703  LATICACIDVEN 2.0*    Recent Results (from the past 240 hour(s))  SARS CORONAVIRUS 2 (TAT 6-24 HRS) Nasopharyngeal Nasopharyngeal Swab     Status: None   Collection Time: 09/04/19  5:14 PM   Specimen: Nasopharyngeal Swab  Result Value Ref Range Status   SARS Coronavirus 2 NEGATIVE NEGATIVE Final    Comment: (NOTE) SARS-CoV-2 target nucleic acids are NOT DETECTED. The SARS-CoV-2 RNA is generally detectable in upper and lower respiratory specimens during the acute phase of infection. Negative results do not preclude SARS-CoV-2 infection, do not rule out co-infections with other pathogens, and should not be used as the sole basis for treatment or other patient management decisions. Negative results must be combined with clinical observations, patient  history, and epidemiological information. The expected result is Negative. Fact Sheet for Patients: Sugar[employer]be Fact Sheet for Healthcare Providers: https://www.woods-mathews.com/ This test is not yet approved or cleared by the [place] FDA and  has been authorized for detection and/or diagnosis of SARS-CoV-2 by FDA under an Emergency Use Authorization (EUA). This EUA will remain  in effect (meaning this test can be used) for the duration of the COVID-19 declaration under Section 56 4(b)(1) of the Act, 21 U.S.C. section 360bbb-3(b)(1), unless the authorization is terminated or revoked sooner. Performed at [facility], [place] [address], [place], [place] [address]   C Difficile Quick Screen w PCR reflex     Status: None   Collection Time: 09/04/19  7:10 PM   Specimen: STOOL  Result Value Ref Range Status   C Diff antigen NEGATIVE NEGATIVE Final   C Diff toxin NEGATIVE NEGATIVE Final   C Diff interpretation No C. difficile detected.  Final    Comment: Performed at [facility], [place] [address] [place], [place] [address]         Radiology Studies: CT ABDOMEN PELVIS W CONTRAST  Result Date: 09/04/2019 CLINICAL DATA:  Acute abdominal pain, nausea, vomiting EXAM: CT ABDOMEN AND PELVIS WITH  CONTRAST TECHNIQUE: Multidetector CT imaging of the abdomen and pelvis was performed using the standard protocol following bolus administration of intravenous contrast. CONTRAST:  24mL OMNIPAQUE IOHEXOL 300 MG/ML  SOLN COMPARISON:  None. FINDINGS: Lower chest: No acute abnormality. Hepatobiliary: No focal liver abnormality. Single 5 mm calcified stone within the gallbladder lumen. No pericholecystic inflammatory changes are evident by CT. No biliary dilatation. Pancreas: Unremarkable. No pancreatic ductal dilatation or surrounding inflammatory changes. Spleen: Normal in size without focal abnormality. Adrenals/Urinary Tract:  Unremarkable adrenal glands. 6 mm calcification within the upper pole of the right kidney. No hydronephrosis. Left kidney appears unremarkable. Evaluation of the distal ureters and urinary bladder is degraded by extensive streak artifact from left hip orthopedic hardware. Stomach/Bowel: Colon is largely fluid-filled. No focal colonic thickening or pericolonic inflammatory changes are seen. An air-filled appendix is present in the right lower quadrant. No dilated loops of small bowel. Vascular/Lymphatic: Aortic atherosclerosis without aneurysm. No abdominopelvic lymphadenopathy is seen. Reproductive: Status post hysterectomy. No adnexal masses. Other: No free air, free fluid, or abdominal wall hernia. Musculoskeletal: Left femoral head hardware with extensive metallic streak artifact. No acute osseous findings. IMPRESSION: 1. Fluid distended colon, which can be seen with an infectious or inflammatory colitis. No focal colonic thickening or pericolonic inflammatory changes are seen. 2. Cholelithiasis without CT evidence for acute cholecystitis. 3. Nonobstructing right-sided nephrolithiasis. Aortic Atherosclerosis (ICD10-I70.0). Electronically Signed   By: Duanne Guess D.O.   On: 09/04/2019 16:51        Scheduled Meds: . budesonide  6 mg Oral Daily  . citalopram  40 mg Oral Daily  . enoxaparin (LOVENOX) injection  40 mg Subcutaneous Q24H  . estradiol  0.25 mg Oral Daily  . levothyroxine  75 mcg Oral QAC breakfast  . topiramate  50 mg Oral BID   Continuous Infusions: . sodium chloride 75 mL/hr at 09/05/19 0958     LOS: 0 days     Jacquelin Hawking, MD [facility] 09/05/2019, 1:15 PM  If 7PM-7AM, please contact night-coverage [email or web address]

## 2019-09-05 NOTE — Consult Note (Addendum)
Referring Provider:  [facility]         Primary Care Physician:  Fanny Bien, MD Primary Gastroenterologist:   Oretha Caprice, MD           We were asked to see this patient for:   Nausea, vomiting, diarrhea               ASSESSMENT /  PLAN    64 year old female with history of collagenous colitis, H. pylori negative gastritis, hypothyroidism, nephrolithiasis, migraine headaches, arthritis, anxiety, and depression.  # Acute nausea / vomiting / diarrhea --No nausea / vomiting in a couple of days. Diarrhea persistent.  --Suspect infectious etiology. She has a history of collagenous colitis but this has been in remission on 6 mg of Budesonide daily and isn't usually associated with nausea and vomiting.  --So far, c-diff is negative. Gi path panel is pending.  --Continue supportive care --She is getting antibiotics, this is probably self-limiting viral infection but will continue antibiotics for now  # Macrocytosis.  --Not sure really anemia, ?  hgb truly in 11 range vrs dilutional.  --Could be from a b12 deficiency as she takes B12 at home --Other etiologies: doubt Etoh. Folate deficiency not excluded as estrogen can impair absorption of folate  #Elevated liver enzymes, new. --AST 96, ALT 88.  Her alk phos and bilirubin are normal. --Enzyme ratio not really c/w Etoh --No liver abnormalities on CT scan --Follow for now, evaluate if doesn't normalize     [facility] Attending   I have taken an interval history, reviewed the chart and examined the patient. I agree with the Advanced Practitioner's note, impression and recommendations.    Seems like a gastroenteritis - getting better. Abnl transaminases could be non-liver source  Continue supportive care  Will f/u  Reviewed w/ her sister Investment banker, corporate at [place])  Gatha Mayer, MD, [facility] 09/05/2019 5:37 PM   HPI:    Chief Complaint: nausea, vomiting, diarrhea  Angelica Stone is a 64 y.o.  female who presented to the ED yesterday with abdominal pain, nausea, vomiting and diarrhea.  She was mildly tachycardic but vital signs otherwise largely unremarkable.  She was hypokalemic, hyponatremic and had AKI.  Her lactic acid was mildly elevated at 2.  White count within normal.  Hemoglobin 16.  SARS negative.  CT scan of the abdomen pelvis with contrast shows a small stone in the gallbladder, fluid distended colon without bowel wall thickening or inflammation.  C. difficile is negative.  GI pathogen panel pending.  Past Medical History:  Diagnosis Date  . Anxiety   . Arthritis   . Depression   . Hypothyroidism   . Kidney stones   . Migraines   . Pneumonia     Past Surgical History:  Procedure Laterality Date  . HIP RESURFACING Left 2018  . TOTAL ABDOMINAL HYSTERECTOMY  2006    Prior to Admission medications   Medication Sig Start Date End Date Taking? Authorizing Provider  budesonide (ENTOCORT EC) 3 MG 24 hr capsule Take two tabs daily Patient taking differently: Take 3 mg by mouth daily. Take two tabs daily 04/02/19  Yes Milus Banister, MD  calcium carbonate (OSCAL) 1500 (600 Ca) MG TABS tablet Take 600 mg of elemental calcium by mouth daily.   Yes [provider]  Cholecalciferol (VITAMIN D3 PO) Take 5,000 Units by mouth daily.    Yes [provider]  citalopram (CELEXA) 40 MG tablet Take 40 mg by  mouth daily.   Yes [provider]  cyanocobalamin 2000 MCG tablet Take 2,000 mcg by mouth daily.   Yes [provider]  estradiol (ESTRACE) 0.5 MG tablet Take 0.5 tablets by mouth daily. 02/05/19  Yes [provider]  levothyroxine (SYNTHROID) 75 MCG tablet Take 75 mcg by mouth daily before breakfast.   Yes [provider]  Multiple Vitamin (MULTIVITAMIN WITH MINERALS) TABS tablet Take 1 tablet by mouth daily. Centrum   Yes [provider]  Multiple Vitamin (MULTIVITAMIN) tablet Take 1 tablet by mouth daily.   Yes  [provider]  Multiple Vitamins-Minerals (ZINC PO) Take 1 tablet by mouth daily.   Yes [provider]  topiramate (TOPAMAX) 50 MG tablet Take 50 mg by mouth 2 (two) times daily.   Yes [provider]  ZOLMitriptan (ZOMIG) 2.5 MG tablet Take 2.5 mg by mouth as needed for migraine.    Yes [provider]    Current Facility-Administered Medications  Medication Dose Route Frequency Provider Last Rate Last Admin  . 0.9 %  sodium chloride infusion   Intravenous Continuous Mariel Aloe, MD 75 mL/hr at 09/05/19 0958 New Bag at 09/05/19 2979  . acetaminophen (TYLENOL) tablet 650 mg  650 mg Oral Q6H PRN Mariel Aloe, MD       Or  . acetaminophen (TYLENOL) suppository 650 mg  650 mg Rectal Q6H PRN Mariel Aloe, MD      . budesonide (ENTOCORT EC) 24 hr capsule 6 mg  6 mg Oral Daily Mariel Aloe, MD   6 mg at 09/05/19 0958  . citalopram (CELEXA) tablet 40 mg  40 mg Oral Daily Mariel Aloe, MD   40 mg at 09/05/19 0958  . enoxaparin (LOVENOX) injection 40 mg  40 mg Subcutaneous Q24H Mariel Aloe, MD   40 mg at 09/04/19 2319  . estradiol (ESTRACE) tablet 0.25 mg  0.25 mg Oral Daily Mariel Aloe, MD      . HYDROcodone-acetaminophen (NORCO/VICODIN) 5-325 MG per tablet 1-2 tablet  1-2 tablet Oral Q4H PRN Mariel Aloe, MD   1 tablet at 09/05/19 0957  . levothyroxine (SYNTHROID) tablet 75 mcg  75 mcg Oral QAC breakfast Mariel Aloe, MD   75 mcg at 09/05/19 0541  . ondansetron (ZOFRAN) tablet 4 mg  4 mg Oral Q6H PRN Mariel Aloe, MD       Or  . ondansetron (ZOFRAN) injection 4 mg  4 mg Intravenous Q6H PRN Mariel Aloe, MD      . topiramate (TOPAMAX) tablet 50 mg  50 mg Oral BID Mariel Aloe, MD   50 mg at 09/05/19 8921    Allergies as of 09/04/2019 - Review Complete 09/04/2019  Allergen Reaction Noted  . Sulfa antibiotics  02/20/2019    Family History  Problem Relation Age of Onset  . Heart disease Mother   . Dementia Mother   .  Arthritis Mother   . Bladder Cancer Father   . Diabetes Father   . Heart disease Father   . Arthritis Father   . Diabetes Sister   . Arthritis Sister   . Dementia Maternal Grandmother   . Heart disease Maternal Grandfather   . Diabetes Paternal Grandfather   . Diabetes Sister   . Arthritis Sister   . Arthritis Sister   . Dementia Maternal Aunt        x 2    Social History   Socioeconomic History  .  Marital status: Single    Spouse name: Not on file  . Number of children: 0  . Years of education: Not on file  . Highest education level: Not on file  Occupational History  . Occupation: Presenter, broadcasting  Tobacco Use  . Smoking status: Former Smoker    Types: Cigarettes    Quit date: 02/20/2016    Years since quitting: 3.5  . Smokeless tobacco: Never Used  Substance and Sexual Activity  . Alcohol use: Yes    Comment: rarely on occasions  . Drug use: Not on file  . Sexual activity: Not on file  Other Topics Concern  . Not on file  Social History Narrative  . Not on file   Social Determinants of Health   Financial Resource Strain:   . Difficulty of Paying Living Expenses:   Food Insecurity:   . Worried About Charity fundraiser in the Last Year:   . Arboriculturist in the Last Year:   Transportation Needs:   . Film/video editor (Medical):   [name] Kitchen Lack of Transportation (Non-Medical):   Physical Activity:   . Days of Exercise per Week:   . Minutes of Exercise per Session:   Stress:   . Feeling of Stress :   Social Connections:   . Frequency of Communication with Friends and Family:   . Frequency of Social Gatherings with Friends and Family:   . Attends Religious Services:   . Active Member of Clubs or Organizations:   . Attends Archivist Meetings:   [name] Kitchen Marital Status:   Intimate Partner Violence:   . Fear of Current or Ex-Partner:   . Emotionally Abused:   [name] Kitchen Physically Abused:   . Sexually Abused:     Review of Systems: All systems reviewed  and negative except where noted in HPI.  Physical Exam: Vital signs in last 24 hours: Temp:  [97.6 F (36.4 C)-98.4 F (36.9 C)] 98 F (36.7 C) (04/01 0925) Pulse Rate:  [72-105] 72 (04/01 0925) Resp:  [14-24] 18 (04/01 0925) BP: (87-113)/(60-92) 87/60 (04/01 0925) SpO2:  [93 %-100 %] 100 % (04/01 0925) Weight:  [54.3 kg] 54.3 kg (03/31 2331) Last BM Date: 09/04/19 General:   Alert, well-developed, female in NAD Psych:  Pleasant, cooperative. Normal mood and affect. Eyes:  Pupils equal, sclera clear, no icterus.   Conjunctiva pink. Ears:  Normal auditory acuity. Nose:  No deformity, discharge,  or lesions. Neck:  Supple; no masses Lungs:  Clear throughout to auscultation.   No wheezes, crackles, or rhonchi.  Heart:  Regular rate and rhythm; no murmurs, no lower extremity edema Abdomen:  Soft, non-distended, nontender, BS active, no palp mass   Rectal:  Deferred  Msk:  Symmetrical without gross deformities. . Neurologic:  Alert and  oriented x4;  grossly normal neurologically. Skin:  Intact without significant lesions or rashes.   Intake/Output from previous day: 03/31 0701 - 04/01 0700 In: 2100 [IV Piggyback:2100] Out: -  Intake/Output this shift: No intake/output data recorded.  Lab Results: Recent Labs    09/04/19 1438 09/05/19 0508  WBC 5.4 4.9  HGB 16.1* 11.7*  HCT 48.4* 35.3*  PLT 220 153   BMET Recent Labs    09/04/19 1438 09/05/19 0508  NA 134* 134*  K 2.9* 3.2*  CL 101 114*  CO2 19* 13*  GLUCOSE 119* 80  BUN 32* 23  CREATININE 1.27* 0.72  CALCIUM 8.6* 7.3*   LFT Recent Labs    09/05/19  0508  PROT 5.6*  ALBUMIN 3.1*  AST 46*  ALT 52*  ALKPHOS 43  BILITOT 0.8     . CBC Latest Ref Rng & Units 09/05/2019 09/04/2019 02/20/2019  WBC 4.0 - 10.5 K/uL 4.9 5.4 7.7  Hemoglobin 12.0 - 15.0 g/dL 11.7(L) 16.1(H) 14.5  Hematocrit 36.0 - 46.0 % 35.3(L) 48.4(H) 42.8  Platelets 150 - 400 K/uL 153 220 265.0    . CMP Latest Ref Rng & Units 09/05/2019  09/04/2019 02/20/2019  Glucose 70 - 99 mg/dL 80 119(H) 73  BUN 8 - 23 mg/dL 23 32(H) 11  Creatinine 0.44 - 1.00 mg/dL 0.72 1.27(H) 0.77  Sodium 135 - 145 mmol/L 134(L) 134(L) 139  Potassium 3.5 - 5.1 mmol/L 3.2(L) 2.9(L) 3.5  Chloride 98 - 111 mmol/L 114(H) 101 108  CO2 22 - 32 mmol/L 13(L) 19(L) 20  Calcium 8.9 - 10.3 mg/dL 7.3(L) 8.6(L) 9.4  Total Protein 6.5 - 8.1 g/dL 5.6(L) 8.2(H) 7.6  Total Bilirubin 0.3 - 1.2 mg/dL 0.8 1.0 0.5  Alkaline Phos 38 - 126 U/L 43 66 76  AST 15 - 41 U/L 46(H) 96(H) 13  ALT 0 - 44 U/L 52(H) 88(H) 10   Studies/Results: CT ABDOMEN PELVIS W CONTRAST  Result Date: 09/04/2019 CLINICAL DATA:  Acute abdominal pain, nausea, vomiting EXAM: CT ABDOMEN AND PELVIS WITH CONTRAST TECHNIQUE: Multidetector CT imaging of the abdomen and pelvis was performed using the standard protocol following bolus administration of intravenous contrast. CONTRAST:  31m OMNIPAQUE IOHEXOL 300 MG/ML  SOLN COMPARISON:  None. FINDINGS: Lower chest: No acute abnormality. Hepatobiliary: No focal liver abnormality. Single 5 mm calcified stone within the gallbladder lumen. No pericholecystic inflammatory changes are evident by CT. No biliary dilatation. Pancreas: Unremarkable. No pancreatic ductal dilatation or surrounding inflammatory changes. Spleen: Normal in size without focal abnormality. Adrenals/Urinary Tract: Unremarkable adrenal glands. 6 mm calcification within the upper pole of the right kidney. No hydronephrosis. Left kidney appears unremarkable. Evaluation of the distal ureters and urinary bladder is degraded by extensive streak artifact from left hip orthopedic hardware. Stomach/Bowel: Colon is largely fluid-filled. No focal colonic thickening or pericolonic inflammatory changes are seen. An air-filled appendix is present in the right lower quadrant. No dilated loops of small bowel. Vascular/Lymphatic: Aortic atherosclerosis without aneurysm. No abdominopelvic lymphadenopathy is seen.  Reproductive: Status post hysterectomy. No adnexal masses. Other: No free air, free fluid, or abdominal wall hernia. Musculoskeletal: Left femoral head hardware with extensive metallic streak artifact. No acute osseous findings. IMPRESSION: 1. Fluid distended colon, which can be seen with an infectious or inflammatory colitis. No focal colonic thickening or pericolonic inflammatory changes are seen. 2. Cholelithiasis without CT evidence for acute cholecystitis. 3. Nonobstructing right-sided nephrolithiasis. Aortic Atherosclerosis (ICD10-I70.0). Electronically Signed   By: NDavina PokeD.O.   On: 09/04/2019 16:51    Principal Problem:   AKI (acute kidney injury) (HDerby Line Active Problems:   Nausea vomiting and diarrhea   Colitis   Migraine   Anxiety   Estrogen deficiency    PTye Savoy NP-C @  09/05/2019, 12:21 PM

## 2019-09-06 DIAGNOSIS — A084 Viral intestinal infection, unspecified: Secondary | ICD-10-CM

## 2019-09-06 DIAGNOSIS — N179 Acute kidney failure, unspecified: Secondary | ICD-10-CM | POA: Diagnosis not present

## 2019-09-06 LAB — GI PATHOGEN PANEL BY PCR, STOOL

## 2019-09-06 LAB — BASIC METABOLIC PANEL
Anion gap: 9 (ref 5–15)
BUN: 11 mg/dL (ref 8–23)
CO2: 16 mmol/L — ABNORMAL LOW (ref 22–32)
Calcium: 7.8 mg/dL — ABNORMAL LOW (ref 8.9–10.3)
Chloride: 114 mmol/L — ABNORMAL HIGH (ref 98–111)
Creatinine, Ser: 0.68 mg/dL (ref 0.44–1.00)
GFR calc Af Amer: >60 mL/min (ref >60)
GFR calc non Af Amer: >60 mL/min (ref >60)
Glucose, Bld: 66 mg/dL — ABNORMAL LOW (ref 70–99)
Potassium: 2.9 mmol/L — ABNORMAL LOW (ref 3.5–5.1)
Sodium: 139 mmol/L (ref 135–145)

## 2019-09-06 LAB — CBC
HCT: 34.3 % — ABNORMAL LOW (ref 36.0–46.0)
Hemoglobin: 11.4 g/dL — ABNORMAL LOW (ref 12.0–15.0)
MCH: 33.9 pg (ref 26.0–34.0)
MCHC: 33.2 g/dL (ref 30.0–36.0)
MCV: 102.1 fL — ABNORMAL HIGH (ref 80.0–100.0)
Platelets: 162 10*3/uL (ref 150–400)
RBC: 3.36 MIL/uL — ABNORMAL LOW (ref 3.87–5.11)
RDW: 12.9 % (ref 11.5–15.5)
WBC: 5.5 10*3/uL (ref 4.0–10.5)
nRBC: 0 % (ref 0.0–0.2)

## 2019-09-06 MED ORDER — ONDANSETRON 4 MG PO TBDP: 4.0000 mg | ORAL_TABLET | Freq: Three times a day (TID) | ORAL | 0 refills | Status: DC | PRN

## 2019-09-06 MED ORDER — POTASSIUM CHLORIDE CRYS ER 10 MEQ PO TBCR: 20.0000 meq | EXTENDED_RELEASE_TABLET | Freq: Every day | ORAL | 0 refills | Status: DC

## 2019-09-06 MED ORDER — POTASSIUM CHLORIDE CRYS ER 20 MEQ PO TBCR
40.0000 meq | EXTENDED_RELEASE_TABLET | Freq: Once | ORAL | Status: AC
  Administered 2019-09-06: 40 meq via ORAL
  Filled 2019-09-06: qty 2

## 2019-09-06 NOTE — Discharge Summary (Signed)
Physician Discharge Summary  Angelica Stone ZOX:[id number] DOB: 10-20-1955 DOA: 09/04/2019  PCP: Lewis Moccasin, MD  Admit date: 09/04/2019 Discharge date: 09/06/2019  Admitted From: Home Disposition: Home  Recommendations for Outpatient Follow-up:  1. Follow up with PCP in 1 week 2. Please obtain BMP in one week 3. Keep hydrated 4. Please follow up on the following pending results: None   Discharge Condition: Stable CODE STATUS: DNR Diet recommendation: As tolerated. Keep hydrated.   Brief/Interim Summary:  Chief Complaint: Nausea/vomiting/diarrhea  HPI: Angelica Stone is a 64 y.o. female with medical history significant of anxiety, depression, hypothyroidism, kidney stones, migraines. Patient reports a two day history of nausea, vomiting and diarrhea. She reports 4-5 stools per day consisting of watery stool. She has associated abdominal pain/cramping. She has tried to keep hydrated with electrolyte containing drinks and kaopectate but this has been complicated by nausea and vomiting. No sick contacts. No history of C. Difficile.   Hospital course:  AKI Baseline creatinine of 0.77. Creatinine of 1.27 on admission. Secondary to dehydration from volume loss. Given 2 L NS bolus in the ED and started on NS IV fluids. Resolved.  Viral gastroenteritis Possibly infectious vs inflammatory. Patient with a history of collagenous. Patient supposed to be taking 6 mg of budesonide. No leukocytosis of fever. C. Difficile negative. No antibiotics started. GI pathogen panel significant for Norovirus. Supportive care. Patient able to tolerate oral intake and discharged with Zofran as needed  Estrogen deficiency Secondary to TAH with oophorectomy per patient. Continue estradiol  Hypothyroidism Continue Synthroid 75 mcg daily  Hypokalemia Improved. In setting of fluid losses. Supplementation given. Will discharge with Kdur on discharge.  Hypocalcemia Unsure of etiology. Patient with  a history of hypothyroidism but not surgical. Albumin is slightly low and correct calcium is still only about 8.0. magnesium is normal. Improved with hydration. Likely no need for follow-up. Can recheck BMP at hospital follow-up.  History of migraine On Topamax and Zomig as an outpatient. Continue Topamax  Anxiety Continue Celexa  Elevated AST/ALT Possibly secondary to infection vs hypovolemia. No liver abnormality noted on CT scan. Normal alkaline phosphatase and no gallbladder pathology noted on CT scan. No RUQ tenderness. Patient drinks alcohol infrequently. Trended down   Discharge Diagnoses:  Principal Problem:   AKI (acute kidney injury) (HCC) Active Problems:   Nausea vomiting and diarrhea   Colitis   Migraine   Anxiety   Estrogen deficiency    Discharge Instructions  Discharge Instructions    Call MD for:  persistant nausea and vomiting   Complete by: As directed    Call MD for:  temperature >100.4   Complete by: As directed    Increase activity slowly   Complete by: As directed      Allergies as of 09/06/2019      Reactions   Sulfa Antibiotics       Medication List    TAKE these medications   budesonide 3 MG 24 hr capsule Commonly known as: ENTOCORT EC Take two tabs daily What changed:   how much to take  how to take this  when to take this   calcium carbonate 1500 (600 Ca) MG Tabs tablet Commonly known as: OSCAL Take 600 mg of elemental calcium by mouth daily.   citalopram 40 MG tablet Commonly known as: CELEXA Take 40 mg by mouth daily.   cyanocobalamin 2000 MCG tablet Take 2,000 mcg by mouth daily.   estradiol 0.5 MG tablet Commonly known as: ESTRACE Take  0.5 tablets by mouth daily.   levothyroxine 75 MCG tablet Commonly known as: SYNTHROID Take 75 mcg by mouth daily before breakfast.   multivitamin tablet Take 1 tablet by mouth daily.   multivitamin with minerals Tabs tablet Take 1 tablet by mouth daily. Centrum    ondansetron 4 MG disintegrating tablet Commonly known as: ZOFRAN-ODT Take 1 tablet (4 mg total) by mouth every 8 (eight) hours as needed for nausea or vomiting.   potassium chloride 10 MEQ tablet Commonly known as: KLOR-CON Take 2 tablets (20 mEq total) by mouth daily for 3 days.   topiramate 50 MG tablet Commonly known as: TOPAMAX Take 50 mg by mouth 2 (two) times daily.   VITAMIN D3 PO Take 5,000 Units by mouth daily.   ZINC PO Take 1 tablet by mouth daily.   ZOLMitriptan 2.5 MG tablet Commonly known as: ZOMIG Take 2.5 mg by mouth as needed for migraine.       Allergies  Allergen Reactions  . Sulfa Antibiotics     Consultations:  GI   Procedures/Studies: CT ABDOMEN PELVIS W CONTRAST  Result Date: 09/04/2019 CLINICAL DATA:  Acute abdominal pain, nausea, vomiting EXAM: CT ABDOMEN AND PELVIS WITH CONTRAST TECHNIQUE: Multidetector CT imaging of the abdomen and pelvis was performed using the standard protocol following bolus administration of intravenous contrast. CONTRAST:  80mL OMNIPAQUE IOHEXOL 300 MG/ML  SOLN COMPARISON:  None. FINDINGS: Lower chest: No acute abnormality. Hepatobiliary: No focal liver abnormality. Single 5 mm calcified stone within the gallbladder lumen. No pericholecystic inflammatory changes are evident by CT. No biliary dilatation. Pancreas: Unremarkable. No pancreatic ductal dilatation or surrounding inflammatory changes. Spleen: Normal in size without focal abnormality. Adrenals/Urinary Tract: Unremarkable adrenal glands. 6 mm calcification within the upper pole of the right kidney. No hydronephrosis. Left kidney appears unremarkable. Evaluation of the distal ureters and urinary bladder is degraded by extensive streak artifact from left hip orthopedic hardware. Stomach/Bowel: Colon is largely fluid-filled. No focal colonic thickening or pericolonic inflammatory changes are seen. An air-filled appendix is present in the right lower quadrant. No dilated  loops of small bowel. Vascular/Lymphatic: Aortic atherosclerosis without aneurysm. No abdominopelvic lymphadenopathy is seen. Reproductive: Status post hysterectomy. No adnexal masses. Other: No free air, free fluid, or abdominal wall hernia. Musculoskeletal: Left femoral head hardware with extensive metallic streak artifact. No acute osseous findings. IMPRESSION: 1. Fluid distended colon, which can be seen with an infectious or inflammatory colitis. No focal colonic thickening or pericolonic inflammatory changes are seen. 2. Cholelithiasis without CT evidence for acute cholecystitis. 3. Nonobstructing right-sided nephrolithiasis. Aortic Atherosclerosis (ICD10-I70.0). Electronically Signed   By: Duanne GuessNicholas  Plundo D.O.   On: 09/04/2019 16:51      Subjective: Diarrhea overnight but some mildly formed stool  Discharge Exam: Vitals:   09/05/19 1409 09/05/19 2031  BP: (!) 94/59 102/69  Pulse: 67 67  Resp: 17 16  Temp: 98 F (36.7 C) 98.1 F (36.7 C)  SpO2: 99% 100%   Vitals:   09/05/19 0332 09/05/19 0925 09/05/19 1409 09/05/19 2031  BP: 95/62 (!) 87/60 (!) 94/59 102/69  Pulse: 87 72 67 67  Resp: 18 18 17 16   Temp: 98.4 F (36.9 C) 98 F (36.7 C) 98 F (36.7 C) 98.1 F (36.7 C)  TempSrc: Oral Oral Oral Oral  SpO2: 97% 100% 99% 100%  Weight:      Height:        General: Pt is alert, awake, not in acute distress Cardiovascular: RRR, S1/S2 +, no rubs, no  gallops Respiratory: CTA bilaterally, no wheezing, no rhonchi Abdominal: Soft, NT, ND, bowel sounds + Extremities: no edema, no cyanosis    The results of significant diagnostics from this hospitalization (including imaging, microbiology, ancillary and laboratory) are listed below for reference.     Microbiology: Recent Results (from the past 240 hour(s))  SARS CORONAVIRUS 2 (TAT 6-24 HRS) Nasopharyngeal Nasopharyngeal Swab     Status: None   Collection Time: 09/04/19  5:14 PM   Specimen: Nasopharyngeal Swab  Result Value Ref  Range Status   SARS Coronavirus 2 NEGATIVE NEGATIVE Final    Comment: (NOTE) SARS-CoV-2 target nucleic acids are NOT DETECTED. The SARS-CoV-2 RNA is generally detectable in upper and lower respiratory specimens during the acute phase of infection. Negative results do not preclude SARS-CoV-2 infection, do not rule out co-infections with other pathogens, and should not be used as the sole basis for treatment or other patient management decisions. Negative results must be combined with clinical observations, patient history, and epidemiological information. The expected result is Negative. Fact Sheet for Patients: SugarRoll.be Fact Sheet for Healthcare Providers: https://www.woods-mathews.com/ This test is not yet approved or cleared by the [place] FDA and  has been authorized for detection and/or diagnosis of SARS-CoV-2 by FDA under an Emergency Use Authorization (EUA). This EUA will remain  in effect (meaning this test can be used) for the duration of the COVID-19 declaration under Section 56 4(b)(1) of the Act, 21 U.S.C. section 360bbb-3(b)(1), unless the authorization is terminated or revoked sooner. Performed at [facility], [place] [address] [place], [place] [address]   C Difficile Quick Screen w PCR reflex     Status: None   Collection Time: 09/04/19  7:10 PM   Specimen: STOOL  Result Value Ref Range Status   C Diff antigen NEGATIVE NEGATIVE Final   C Diff toxin NEGATIVE NEGATIVE Final   C Diff interpretation No C. difficile detected.  Final    Comment: Performed at [facility], [place] [address], [place], [place] [address]  GI pathogen panel by PCR, stool     Status: Abnormal   Collection Time: 09/04/19  7:10 PM   Specimen: STOOL  Result Value Ref Range Status   Plesiomonas shigelloides NOT DETECTED NOT DETECTED Final   Yersinia enterocolitica NOT DETECTED NOT DETECTED Final   Vibrio NOT DETECTED NOT  DETECTED Final   Enteropathogenic E coli NOT DETECTED NOT DETECTED Final   E coli (ETEC) LT/ST NOT DETECTED NOT DETECTED Final   E coli 5462 by PCR Not applicable NOT DETECTED Final   Cryptosporidium by PCR NOT DETECTED NOT DETECTED Final   Entamoeba histolytica NOT DETECTED NOT DETECTED Final   Adenovirus F 40/41 NOT DETECTED NOT DETECTED Final   Norovirus GI/GII DETECTED (A) NOT DETECTED Final   Sapovirus NOT DETECTED NOT DETECTED Final    Comment: (NOTE) Performed At: [facility] [address] [place], [place] [id number] Rush Farmer MD HW:[id number]    Vibrio cholerae NOT DETECTED NOT DETECTED Final   Campylobacter by PCR NOT DETECTED NOT DETECTED Final   Salmonella by PCR NOT DETECTED NOT DETECTED Final   E coli (STEC) NOT DETECTED NOT DETECTED Final   Enteroaggregative E coli NOT DETECTED NOT DETECTED Final   Shigella by PCR NOT DETECTED NOT DETECTED Final   Cyclospora cayetanensis NOT DETECTED NOT DETECTED Final   Astrovirus NOT DETECTED NOT DETECTED Final   G lamblia by PCR NOT DETECTED NOT DETECTED Final   Rotavirus A by PCR  NOT DETECTED NOT DETECTED Final     Labs: BNP (last 3 results) No results for input(s): BNP in the last 8760 hours. Basic Metabolic Panel: Recent Labs  Lab 09/04/19 1438 09/05/19 0508 09/06/19 0917  NA 134* 134* 139  K 2.9* 3.2* 2.9*  CL 101 114* 114*  CO2 19* 13* 16*  GLUCOSE 119* 80 66*  BUN 32* 23 11  CREATININE 1.27* 0.72 0.68  CALCIUM 8.6* 7.3* 7.8*  MG 2.3 2.2  --    Liver Function Tests: Recent Labs  Lab 09/04/19 1438 09/05/19 0508  AST 96* 46*  ALT 88* 52*  ALKPHOS 66 43  BILITOT 1.0 0.8  PROT 8.2* 5.6*  ALBUMIN 4.6 3.1*   Recent Labs  Lab 09/04/19 1438  LIPASE 26   No results for input(s): AMMONIA in the last 168 hours. CBC: Recent Labs  Lab 09/04/19 1438 09/05/19 0508 09/06/19 0917  WBC 5.4 4.9 5.5  HGB 16.1* 11.7* 11.4*  HCT 48.4* 35.3* 34.3*  MCV 99.6 100.6* 102.1*  PLT 220 153 162    Cardiac Enzymes: No results for input(s): CKTOTAL, CKMB, CKMBINDEX, TROPONINI in the last 168 hours. BNP: Invalid input(s): POCBNP CBG: No results for input(s): GLUCAP in the last 168 hours. D-Dimer No results for input(s): DDIMER in the last 72 hours. Hgb A1c No results for input(s): HGBA1C in the last 72 hours. Lipid Profile No results for input(s): CHOL, HDL, LDLCALC, TRIG, CHOLHDL, LDLDIRECT in the last 72 hours. Thyroid function studies No results for input(s): TSH, T4TOTAL, T3FREE, THYROIDAB in the last 72 hours.  Invalid input(s): FREET3 Anemia work up Recent Labs    09/05/19 1354  VITAMINB12 243  FOLATE 21.2   Urinalysis    Component Value Date/Time   COLORURINE YELLOW 09/04/2019 1910   APPEARANCEUR CLEAR 09/04/2019 1910   LABSPEC 1.030 09/04/2019 1910   PHURINE 6.0 09/04/2019 1910   GLUCOSEU NEGATIVE 09/04/2019 1910   HGBUR SMALL (A) 09/04/2019 1910   BILIRUBINUR NEGATIVE 09/04/2019 1910   KETONESUR NEGATIVE 09/04/2019 1910   PROTEINUR NEGATIVE 09/04/2019 1910   NITRITE NEGATIVE 09/04/2019 1910   LEUKOCYTESUR NEGATIVE 09/04/2019 1910   Sepsis Labs Invalid input(s): PROCALCITONIN,  WBC,  LACTICIDVEN Microbiology Recent Results (from the past 240 hour(s))  SARS CORONAVIRUS 2 (TAT 6-24 HRS) Nasopharyngeal Nasopharyngeal Swab     Status: None   Collection Time: 09/04/19  5:14 PM   Specimen: Nasopharyngeal Swab  Result Value Ref Range Status   SARS Coronavirus 2 NEGATIVE NEGATIVE Final    Comment: (NOTE) SARS-CoV-2 target nucleic acids are NOT DETECTED. The SARS-CoV-2 RNA is generally detectable in upper and lower respiratory specimens during the acute phase of infection. Negative results do not preclude SARS-CoV-2 infection, do not rule out co-infections with other pathogens, and should not be used as the sole basis for treatment or other patient management decisions. Negative results must be combined with clinical observations, patient history, and  epidemiological information. The expected result is Negative. Fact Sheet for Patients: HairSlick.no Fact Sheet for Healthcare Providers: quierodirigir.com This test is not yet approved or cleared by the [place] FDA and  has been authorized for detection and/or diagnosis of SARS-CoV-2 by FDA under an Emergency Use Authorization (EUA). This EUA will remain  in effect (meaning this test can be used) for the duration of the COVID-19 declaration under Section 56 4(b)(1) of the Act, 21 U.S.C. section 360bbb-3(b)(1), unless the authorization is terminated or revoked sooner. Performed at [facility], [address] [place],  [place] [address]   C Difficile Quick Screen w PCR reflex     Status: None   Collection Time: 09/04/19  7:10 PM   Specimen: STOOL  Result Value Ref Range Status   C Diff antigen NEGATIVE NEGATIVE Final   C Diff toxin NEGATIVE NEGATIVE Final   C Diff interpretation No C. difficile detected.  Final    Comment: Performed at [facility], [address], [place], [place] [address]  GI pathogen panel by PCR, stool     Status: Abnormal   Collection Time: 09/04/19  7:10 PM   Specimen: STOOL  Result Value Ref Range Status   Plesiomonas shigelloides NOT DETECTED NOT DETECTED Final   Yersinia enterocolitica NOT DETECTED NOT DETECTED Final   Vibrio NOT DETECTED NOT DETECTED Final   Enteropathogenic E coli NOT DETECTED NOT DETECTED Final   E coli (ETEC) LT/ST NOT DETECTED NOT DETECTED Final   E coli 0157 by PCR Not applicable NOT DETECTED Final   Cryptosporidium by PCR NOT DETECTED NOT DETECTED Final   Entamoeba histolytica NOT DETECTED NOT DETECTED Final   Adenovirus F 40/41 NOT DETECTED NOT DETECTED Final   Norovirus GI/GII DETECTED (A) NOT DETECTED Final   Sapovirus NOT DETECTED NOT DETECTED Final    Comment: (NOTE) Performed At: [facility] [address] [place], [place]  [id number] Jolene Schimke MD ON:[id number]    Vibrio cholerae NOT DETECTED NOT DETECTED Final   Campylobacter by PCR NOT DETECTED NOT DETECTED Final   Salmonella by PCR NOT DETECTED NOT DETECTED Final   E coli (STEC) NOT DETECTED NOT DETECTED Final   Enteroaggregative E coli NOT DETECTED NOT DETECTED Final   Shigella by PCR NOT DETECTED NOT DETECTED Final   Cyclospora cayetanensis NOT DETECTED NOT DETECTED Final   Astrovirus NOT DETECTED NOT DETECTED Final   G lamblia by PCR NOT DETECTED NOT DETECTED Final   Rotavirus A by PCR NOT DETECTED NOT DETECTED Final     Time coordinating discharge: 35 minutes  SIGNED:   Jacquelin Hawking, MD [facility] 09/06/2019, 12:54 PM

## 2019-09-06 NOTE — Plan of Care (Signed)

## 2019-09-06 NOTE — Discharge Instructions (Signed)
Norovirus Infection ° °Norovirus infection causes inflammation in the stomach and intestines (gastroenteritis) and food poisoning. It is caused by exposure to a virus in a group of similar viruses called noroviruses. °Norovirus spreads very easily from person to person (is very contagious). It often occurs in places where people are in close contact, such as schools, nursing homes, and cruise ships. You can get it from food, water, surfaces, or other people who have the virus (are contaminated). Norovirus is also found in the stool (feces) or vomit of infected people. You can spread the infection as soon as you feel sick, and you may continue to be contagious after you recover. °What are the causes? °This condition is caused by contact with norovirus. You can catch norovirus if you: °· Eat or drink something that is contaminated with norovirus. °· Touch surfaces or objects that are contaminated with norovirus and then put your hand in or by your mouth or nose. °· Have direct contact with an infected person who has symptoms. °· Share food, drink, or utensils with someone who is sick with norovirus. °What are the signs or symptoms? °Symptoms usually begin within 12 hours to 2 days after you become infected. Most norovirus symptoms affect the digestive system.Symptoms may include: °· Nausea. °· Vomiting. °· Diarrhea. °· Stomach cramps. °· Fever. °· Chills. °· Headache. °· Muscle aches. °· Tiredness. °How is this diagnosed? °This condition may be diagnosed based on: °· Your symptoms. °· A physical exam. °· A stool test. °How is this treated? °There is no specific treatment for norovirus. Most people get better without treatment in about 2 days. Young children, the elderly, and people who are already sick may take up to 6 days to recover. °Follow these instructions at home: °Eating and drinking °· Drink plenty of water to replace fluids that are lost through diarrhea and vomiting. This prevents dehydration. Drink enough  fluid to keep your urine clear or pale yellow. °· Drink clear fluids in small amounts as you are able. Clear fluids include water, ice chips, fruit juice with water added (diluted fruit juice), and low-calorie sports drinks. °? Avoid fluids that contain a lot of sugar or caffeine, such as energy drinks, sports drinks, and soda. °? Avoid alcohol. °· If instructed by your health care provider, drink an oral rehydration solution (ORS). This is a drink that is sold at pharmacies and retail stores. An ORS contains minerals (electrolytes) that you can lose through diarrhea and vomiting. °· Eat bland, easy-to-digest foods in small amounts as you are able. These foods include bananas, applesauce, rice, lean meats, toast, and crackers. °? Avoid spicy or fatty foods. °General instructions ° °· Rest at home while you recover. °· Do not prepare food for others while you are infected. Wait at least 3 days after you recover from the illness to do this. °· Take over-the-counter and prescription medicines only as told by your health care provider. °· Wash your hands frequently with soap and water. If soap and water are not available, use hand sanitizer. °· Make sure that all people in your household wash their hands well and often. °· Keep all follow-up visits as told by your health care provider. This is important. °How is this prevented? °To help prevent the spread of norovirus: °· Stay at home if you are feeling sick. This will reduce the risk of spreading the virus to others. °· Wash your hands often with soap and water for at least 20 seconds, especially after using the   toilet or changing a diaper. °· Wash fruits and vegetables thoroughly before peeling, preparing, or serving them. °· Throw out any food that a sick person may have touched. °· Disinfect contaminated surfaces immediately after someone in the household has been sick. Use a bleach-based household cleaner. °· Immediately remove and wash soiled clothes or  sheets. °Contact a health care provider if: °· You have vomiting, diarrhea, or stomach pain that gets worse. °· You have symptoms that do not go away after 2-6 days. °· You have a fever. °· You cannot drink without vomiting. °· You feel light-headed or dizzy. °· Your symptoms get worse. °Get help right away if: °You develop symptoms of dehydration that do not improve with fluid replacement, such as: °· Excessive sleepiness. °· Lack of tears. °· Very little urine production. °· Dry mouth. °· Muscle cramps. °· Weak pulse. °· Confusion. °Summary °· Norovirus infection is common and often occurs in places where people are in close contact, such as schools, nursing homes, and cruise ships. °· To help prevent the spread of this infection, wash hands with soap and water for at least 20 seconds before handling food or after having contact with stool or body fluids. °· There is no specific treatment for norovirus, but most people get better without treatment in about 2 days. People who are healthy when infected often recover sooner than those who are elderly, young, or already sick. °· Replace lost fluids by drinking plenty of water, or by drinking oral rehydration solution (ORS), which contains important minerals called electrolytes. This prevents dehydration. °This information is not intended to replace advice given to you by your health care provider. Make sure you discuss any questions you have with your health care provider. °Document Revised: 09/14/2018 Document Reviewed: 06/29/2016 °Elsevier Patient Education © 2020 [employer] Inc. ° °

## 2019-09-06 NOTE — Progress Notes (Signed)
Angelica Stone to be D/C'd Home per MD order.  Discussed prescriptions and follow up appointments with the patient. Prescriptions given to patient, medication list explained in detail. Pt verbalized understanding.  Allergies as of 09/06/2019      Reactions   Sulfa Antibiotics       Medication List    TAKE these medications   budesonide 3 MG 24 hr capsule Commonly known as: ENTOCORT EC Take two tabs daily What changed:   how much to take  how to take this  when to take this   calcium carbonate 1500 (600 Ca) MG Tabs tablet Commonly known as: OSCAL Take 600 mg of elemental calcium by mouth daily.   citalopram 40 MG tablet Commonly known as: CELEXA Take 40 mg by mouth daily.   cyanocobalamin 2000 MCG tablet Take 2,000 mcg by mouth daily.   estradiol 0.5 MG tablet Commonly known as: ESTRACE Take 0.5 tablets by mouth daily.   levothyroxine 75 MCG tablet Commonly known as: SYNTHROID Take 75 mcg by mouth daily before breakfast.   multivitamin tablet Take 1 tablet by mouth daily.   multivitamin with minerals Tabs tablet Take 1 tablet by mouth daily. Centrum   ondansetron 4 MG disintegrating tablet Commonly known as: ZOFRAN-ODT Take 1 tablet (4 mg total) by mouth every 8 (eight) hours as needed for nausea or vomiting.   potassium chloride 10 MEQ tablet Commonly known as: KLOR-CON Take 2 tablets (20 mEq total) by mouth daily for 3 days.   topiramate 50 MG tablet Commonly known as: TOPAMAX Take 50 mg by mouth 2 (two) times daily.   VITAMIN D3 PO Take 5,000 Units by mouth daily.   ZINC PO Take 1 tablet by mouth daily.   ZOLMitriptan 2.5 MG tablet Commonly known as: ZOMIG Take 2.5 mg by mouth as needed for migraine.       Vitals:   09/05/19 1409 09/05/19 2031  BP: (!) 94/59 102/69  Pulse: 67 67  Resp: 17 16  Temp: 98 F (36.7 C) 98.1 F (36.7 C)  SpO2: 99% 100%    Skin clean, dry and intact without evidence of skin break down, no evidence of skin tears  noted. IV catheter discontinued intact. Site without signs and symptoms of complications. Dressing and pressure applied. Pt denies pain at this time. No complaints noted.  An After Visit Summary was printed and given to the patient. Patient escorted via WC, and D/C home via private auto.  Viviana Simpler S 09/06/2019 2:07 PM

## 2019-09-11 ENCOUNTER — Other Ambulatory Visit: Payer: Self-pay | Admitting: Family Medicine

## 2019-09-11 DIAGNOSIS — R1013 Epigastric pain: Secondary | ICD-10-CM

## 2019-09-12 ENCOUNTER — Ambulatory Visit
Admission: RE | Admit: 2019-09-12 | Discharge: 2019-09-12 | Disposition: A | Discharge status: Home / Self Care | Payer: 59 | Source: Ambulatory Visit | Attending: Family Medicine | Admitting: Family Medicine

## 2019-09-12 DIAGNOSIS — R1013 Epigastric pain: Secondary | ICD-10-CM

## 2019-09-16 ENCOUNTER — Ambulatory Visit: Payer: 59 | Attending: Internal Medicine

## 2019-09-16 DIAGNOSIS — Z23 Encounter for immunization: Secondary | ICD-10-CM

## 2019-09-16 NOTE — Progress Notes (Signed)
   Covid-19 Vaccination Clinic  Name:  Delta Pichon    MRN: [id number] DOB: Oct 07, 1955  09/16/2019  Ms. Paccione was observed post Covid-19 immunization for 15 minutes without incident. She was provided with Vaccine Information Sheet and instruction to access the V-Safe system.   Ms. Colgate was instructed to call 911 with any severe reactions post vaccine: [name] Kitchen Difficulty breathing  . Swelling of face and throat  . A fast heartbeat  . A bad rash all over body  . Dizziness and weakness   Immunizations Administered    Name Date Dose VIS Date Route   Pfizer COVID-19 Vaccine 09/16/2019 12:53 PM 0.3 mL 05/17/2019 Intramuscular   Manufacturer: [employer], [employer]   Lot: [id number]   NDC: [address]-7

## 2019-10-07 ENCOUNTER — Ambulatory Visit: Payer: 59 | Attending: Internal Medicine

## 2019-10-07 DIAGNOSIS — Z23 Encounter for immunization: Secondary | ICD-10-CM

## 2019-10-07 NOTE — Progress Notes (Signed)
   Covid-19 Vaccination Clinic  Name:  Angelica Stone    MRN: [id number] DOB: 1955-12-12  10/07/2019  [name] was observed post Covid-19 immunization for 15 minutes without incident. She was provided with Vaccine Information Sheet and instruction to access the V-Safe system.   [name] was instructed to call 911 with any severe reactions post vaccine: [name] Kitchen Difficulty breathing  . Swelling of face and throat  . A fast heartbeat  . A bad rash all over body  . Dizziness and weakness   Immunizations Administered    Name Date Dose VIS Date Route   Pfizer COVID-19 Vaccine 10/07/2019  1:50 PM 0.3 mL 07/31/2018 Intramuscular   Manufacturer: [employer], [employer]   Lot: [id number]   NDC: [address]-1

## 2019-10-25 ENCOUNTER — Ambulatory Visit (INDEPENDENT_AMBULATORY_CARE_PROVIDER_SITE_OTHER): Payer: 59 | Admitting: Orthopaedic Surgery

## 2019-10-25 ENCOUNTER — Other Ambulatory Visit: Payer: Self-pay

## 2019-10-25 ENCOUNTER — Encounter: Payer: Self-pay | Admitting: Orthopaedic Surgery

## 2019-10-25 ENCOUNTER — Ambulatory Visit (INDEPENDENT_AMBULATORY_CARE_PROVIDER_SITE_OTHER): Payer: 59

## 2019-10-25 VITALS — Ht 68.5 in | Wt 113.0 lb

## 2019-10-25 DIAGNOSIS — M25511 Pain in right shoulder: Secondary | ICD-10-CM

## 2019-10-25 DIAGNOSIS — G8929 Other chronic pain: Secondary | ICD-10-CM

## 2019-10-25 DIAGNOSIS — M25552 Pain in left hip: Secondary | ICD-10-CM

## 2019-10-25 MED ORDER — BUPIVACAINE HCL 0.5 % IJ SOLN
3.0000 mL | INTRAMUSCULAR | Status: AC | PRN
  Administered 2019-10-25: 3 mL via INTRA_ARTICULAR

## 2019-10-25 MED ORDER — LIDOCAINE HCL 1 % IJ SOLN
3.0000 mL | INTRAMUSCULAR | Status: AC | PRN
  Administered 2019-10-25: 3 mL

## 2019-10-25 MED ORDER — METHYLPREDNISOLONE ACETATE 40 MG/ML IJ SUSP
40.0000 mg | INTRAMUSCULAR | Status: AC | PRN
  Administered 2019-10-25: 40 mg via INTRA_ARTICULAR

## 2019-10-25 NOTE — Progress Notes (Signed)
Office Visit Note   Patient: Angelica Stone           Date of Birth: December 01, 1955           MRN: [id number] Visit Date: 10/25/2019              Requested by: Fanny Bien, [address] [place],  [place] [address] PCP: Fanny Bien, MD   Assessment & Plan: Visit Diagnoses:  1. Pain in left hip   2. Chronic right shoulder pain     Plan: Impression is chronic left hip pain status post hip resurfacing and chronic right shoulder pain likely due to bursitis versus rotator cuff tendinosis.  For the left hip we will need to obtain lab work to rule out infection and check her metal ion levels as well as obtain MARS MRI to evaluate for pseudotumor and bone scan to evaluate for loosening.  For the right shoulder based on our discussion she elected to have a subacromial injection today.  We also provide her with home exercises and resistance bands.  We will see her back in the near future to follow-up on the hip.  Follow-Up Instructions: No follow-ups on file.   Orders:  Orders Placed This Encounter  Procedures  . XR HIP UNILAT W OR W/O PELVIS 2-3 VIEWS LEFT   No orders of the defined types were placed in this encounter.     Procedures: Large Joint Inj: R subacromial bursa on 10/25/2019 8:43 AM Indications: pain Details: 22 G needle  Arthrogram: No  Medications: 3 mL lidocaine 1 %; 3 mL bupivacaine 0.5 %; 40 mg methylPREDNISolone acetate 40 MG/ML Outcome: tolerated well, no immediate complications Consent was given by the patient. Patient was prepped and draped in the usual sterile fashion.       Clinical Data: No additional findings.   Subjective: Chief Complaint  Patient presents with  . Left Hip - Pain    Female is a 64 year old female who comes in for evaluation of left hip pain that has significantly gotten worse in the last 8 months.  She is a referral from her PCP.  She had a left hip resurfacing in [place] 3 years ago.  Originally she did well  but now recently she is felt increasing lateral and groin hip pain.  She walks with a severe limp.  She feels a grinding movement in the groin region.  Denies any radicular symptoms.  She feels a clicking as well.  She is also here to be evaluated for right shoulder pain.  She has diagnosed with degenerative disc disease of her C-spine about 13 years ago.  She is right-hand dominant.  She has pain in the lateral portion of her upper arm and shoulder region.  She endorses some radicular pain and numbness and tingling.   Review of Systems  Constitutional: Negative.   HENT: Negative.   Eyes: Negative.   Respiratory: Negative.   Cardiovascular: Negative.   Endocrine: Negative.   Musculoskeletal: Negative.   Neurological: Negative.   Hematological: Negative.   Psychiatric/Behavioral: Negative.   All other systems reviewed and are negative.    Objective: Vital Signs: Ht 5' 8.5" (1.74 m)   Wt 113 lb (51.3 kg)   BMI 16.93 kg/m   Physical Exam Vitals and nursing note reviewed.  Constitutional:      Appearance: She is well-developed.  HENT:     Head: Normocephalic and atraumatic.  Pulmonary:     Effort: Pulmonary  effort is normal.  Abdominal:     Palpations: Abdomen is soft.  Musculoskeletal:     Cervical back: Neck supple.  Skin:    General: Skin is warm.     Capillary Refill: Capillary refill takes less than 2 seconds.  Neurological:     Mental Status: She is alert and oriented to person, place, and time.  Psychiatric:        Behavior: Behavior normal.        Thought Content: Thought content normal.        Judgment: Judgment normal.     Ortho Exam Right shoulder shows mild decreased range of motion with mild pain.  Manual muscle testing is normal.  Mild impingement signs.  Left hip shows significant pain with logroll Stinchfield and internal rotation.  Negative straight leg raise.  No pain with straight leg.  Surgical scar is fully healed. Specialty Comments:  No  specialty comments available.  Imaging: No results found.   PMFS History: Patient Active Problem List   Diagnosis Date Noted  . Pain in left hip 10/25/2019  . Chronic right shoulder pain 10/25/2019  . Viral gastroenteritis 09/06/2019  . AKI (acute kidney injury) (HCC) 09/04/2019  . Nausea vomiting and diarrhea 09/04/2019  . Colitis 09/04/2019  . Migraine 09/04/2019  . Anxiety 09/04/2019  . Estrogen deficiency 09/04/2019   Past Medical History:  Diagnosis Date  . Anxiety   . Arthritis   . Depression   . Hypothyroidism   . Kidney stones   . Migraines   . Pneumonia     Family History  Problem Relation Age of Onset  . Heart disease Mother   . Dementia Mother   . Arthritis Mother   . Bladder Cancer Father   . Diabetes Father   . Heart disease Father   . Arthritis Father   . Diabetes Sister   . Arthritis Sister   . Dementia Maternal Grandmother   . Heart disease Maternal Grandfather   . Diabetes Paternal Grandfather   . Diabetes Sister   . Arthritis Sister   . Arthritis Sister   . Dementia Maternal Aunt        x 2    Past Surgical History:  Procedure Laterality Date  . HIP RESURFACING Left 2018  . TOTAL ABDOMINAL HYSTERECTOMY  2006   Social History   Occupational History  . Occupation: Herbalist  Tobacco Use  . Smoking status: Former Smoker    Types: Cigarettes    Quit date: 02/20/2016    Years since quitting: 3.6  . Smokeless tobacco: Never Used  Substance and Sexual Activity  . Alcohol use: Yes    Comment: rarely on occasions  . Drug use: Not on file  . Sexual activity: Not on file

## 2019-10-29 LAB — CHROMIUM LEVEL

## 2019-10-29 LAB — SED RATE MANUAL WEST RFLX: SED RATE BY MODIFIED WESTERGREN,MANUAL: 25 mm/h (ref 0–30)

## 2019-10-29 LAB — C-REACTIVE PROTEIN: CRP: 2 mg/L (ref <8.0)

## 2019-10-29 LAB — SEDIMENTATION RATE

## 2019-10-29 LAB — COBALT, SERUM/PLASMA

## 2019-11-05 ENCOUNTER — Encounter (HOSPITAL_COMMUNITY): Payer: 59

## 2019-11-08 ENCOUNTER — Ambulatory Visit (HOSPITAL_COMMUNITY)
Admission: RE | Admit: 2019-11-08 | Discharge: 2019-11-08 | Disposition: A | Discharge status: Home / Self Care | Payer: 59 | Source: Ambulatory Visit | Attending: Orthopaedic Surgery | Admitting: Orthopaedic Surgery

## 2019-11-08 ENCOUNTER — Encounter: Payer: Self-pay | Admitting: Gastroenterology

## 2019-11-08 ENCOUNTER — Ambulatory Visit (INDEPENDENT_AMBULATORY_CARE_PROVIDER_SITE_OTHER): Payer: 59 | Admitting: Gastroenterology

## 2019-11-08 ENCOUNTER — Other Ambulatory Visit: Payer: Self-pay

## 2019-11-08 ENCOUNTER — Encounter (HOSPITAL_COMMUNITY)
Admission: RE | Admit: 2019-11-08 | Discharge: 2019-11-08 | Disposition: A | Discharge status: Home / Self Care | Payer: 59 | Source: Ambulatory Visit | Attending: Orthopaedic Surgery | Admitting: Orthopaedic Surgery

## 2019-11-08 VITALS — BP 100/70 | HR 96 | Ht 68.5 in | Wt 115.0 lb

## 2019-11-08 DIAGNOSIS — M25552 Pain in left hip: Secondary | ICD-10-CM | POA: Diagnosis present

## 2019-11-08 DIAGNOSIS — K52831 Collagenous colitis: Secondary | ICD-10-CM

## 2019-11-08 MED ORDER — BUDESONIDE 3 MG PO CPEP: ORAL_CAPSULE | ORAL | 6 refills | Status: DC

## 2019-11-08 MED ORDER — TECHNETIUM TC 99M MEDRONATE IV KIT
20.0000 | PACK | Freq: Once | INTRAVENOUS | Status: AC | PRN
  Administered 2019-11-08: 20 via INTRAVENOUS

## 2019-11-08 NOTE — Patient Instructions (Addendum)
If you are age 62 or older, your body mass index should be between 23-30. Your Body mass index is 17.23 kg/m. If this is out of the aforementioned range listed, please consider follow up with your Primary Care Provider.  If you are age 6 or younger, your body mass index should be between 19-25. Your Body mass index is 17.23 kg/m. If this is out of the aformentioned range listed, please consider follow up with your Primary Care Provider.   CHANGE: budesonide 3mg  capsule to 2 capsules alternating with 1 capsule for 1 month.  Please call our office in 1 month to let know how you are feeling with the medication changes.  Thank you for entrusting me with your care and choosing [facility].  Dr [facility]

## 2019-11-08 NOTE — Progress Notes (Signed)
Review of pertinent gastrointestinal problems: 1. Collagenous colitis; colonoscopy August 2016 in [name], Dr. Arnoldo Morale for diarrhea and weight loss showed normal mucosa throughout except for scattered diverticulosis.  Random biopsies showed "collagenous colitis".  Establish care [facility] gastroenterology September 2020.  Lab tests including CBC, complete metabolic profile, celiac sprue testing, were all normal.  Started budesonide 9 mg once daily. 2.  Norovirus + acute vomiting and diarrheal illness April 2021.  Brief admission to the hospital for IV fluids and supportive care.  Associated with electrolyte abnormalities, mild renal insufficiency and transient elevation in liver tests.   Other GI procedures Dr. Arnoldo Morale: EGD September 2018 for melena was normal. EGD August 2016 showed mild gastritis biopsies of the stomach and duodenum were normal. Biopsies were negative for H. pylori or celiac sprue. Colonoscopy October 2011 for lower abdominal pain and diarrhea. Examination was essentially normal. Biopsies were taken from the right and left colon and were all completely normal on pathology.  HPI: This is a very pleasant 64 year old woman whom I last saw October 2020.  She was admitted for a 2 night stay about 2 months ago for acute norovirus gastroenteritis.  She presented with significant vomiting and nonbloody diarrhea.  Her electrolytes were abnormal, she had brief renal insufficiency.  It really took her 3 to 4 days after she left the hospital before she was feeling decent again and several weeks for her bowels to get back to her normal.  She does think her bowels are back to their normal which is she has a solid soft brown bowel movement every day or every other day now.  She is bothered by intermittent lower abdominal cramps that last for 2 to 3 minutes about 4 or 5 times per week.  She has been on 2 budesonide pills daily for several months with a brief hold during her  neurovirus infection   ROS: complete GI ROS as described in HPI, all other review negative.  Constitutional:  No unintentional weight loss   Past Medical History:  Diagnosis Date  . Anxiety   . Arthritis   . Depression   . Hypothyroidism   . Kidney stones   . Migraines   . Pneumonia     Past Surgical History:  Procedure Laterality Date  . HIP RESURFACING Left 2018  . TOTAL ABDOMINAL HYSTERECTOMY  2006    Current Outpatient Medications  Medication Sig Dispense Refill  . budesonide (ENTOCORT EC) 3 MG 24 hr capsule Take two tabs daily (Patient taking differently: Take 3 mg by mouth daily. Take two tabs daily) 60 capsule 6  . calcium carbonate (OSCAL) 1500 (600 Ca) MG TABS tablet Take 600 mg of elemental calcium by mouth daily.    . Cholecalciferol (VITAMIN D3 PO) Take 5,000 Units by mouth daily.     . citalopram (CELEXA) 40 MG tablet Take 40 mg by mouth daily.    . cyanocobalamin 2000 MCG tablet Take 2,000 mcg by mouth daily.    [name] Kitchen estradiol (ESTRACE) 0.5 MG tablet Take 0.5 tablets by mouth daily.    [name] Kitchen levothyroxine (SYNTHROID) 75 MCG tablet Take 75 mcg by mouth daily before breakfast.    . Multiple Vitamin (MULTIVITAMIN WITH MINERALS) TABS tablet Take 1 tablet by mouth daily. Centrum    . Multiple Vitamin (MULTIVITAMIN) tablet Take 1 tablet by mouth daily.    . Multiple Vitamins-Minerals (ZINC PO) Take 1 tablet by mouth daily.    . ondansetron (ZOFRAN-ODT) 4 MG disintegrating tablet Take 1 tablet (4  mg total) by mouth every 8 (eight) hours as needed for nausea or vomiting. 20 tablet 0  . potassium chloride (KLOR-CON) 10 MEQ tablet Take 2 tablets (20 mEq total) by mouth daily for 3 days. 6 tablet 0  . topiramate (TOPAMAX) 50 MG tablet Take 50 mg by mouth 2 (two) times daily.    [name] Kitchen ZOLMitriptan (ZOMIG) 2.5 MG tablet Take 2.5 mg by mouth as needed for migraine.      No current facility-administered medications for this visit.    Allergies as of 11/08/2019 - Review Complete  11/08/2019  Allergen Reaction Noted  . Sulfa antibiotics  02/20/2019    Family History  Problem Relation Age of Onset  . Heart disease Mother   . Dementia Mother   . Arthritis Mother   . Bladder Cancer Father   . Diabetes Father   . Heart disease Father   . Arthritis Father   . Diabetes Sister   . Arthritis Sister   . Dementia Maternal Grandmother   . Heart disease Maternal Grandfather   . Diabetes Paternal Grandfather   . Diabetes Sister   . Arthritis Sister   . Arthritis Sister   . Dementia Maternal Aunt        x 2    Social History   Socioeconomic History  . Marital status: Single    Spouse name: Not on file  . Number of children: 0  . Years of education: Not on file  . Highest education level: Not on file  Occupational History  . Occupation: Herbalist  Tobacco Use  . Smoking status: Former Smoker    Types: Cigarettes    Quit date: 02/20/2016    Years since quitting: 3.7  . Smokeless tobacco: Never Used  Substance and Sexual Activity  . Alcohol use: Yes    Comment: rarely on occasions  . Drug use: Not on file  . Sexual activity: Not on file  Other Topics Concern  . Not on file  Social History Narrative  . Not on file   Social Determinants of Health   Financial Resource Strain:   . Difficulty of Paying Living Expenses:   Food Insecurity:   . Worried About Programme researcher, broadcasting/film/video in the Last Year:   . Barista in the Last Year:   Transportation Needs:   . Freight forwarder (Medical):   [name] Kitchen Lack of Transportation (Non-Medical):   Physical Activity:   . Days of Exercise per Week:   . Minutes of Exercise per Session:   Stress:   . Feeling of Stress :   Social Connections:   . Frequency of Communication with Friends and Family:   . Frequency of Social Gatherings with Friends and Family:   . Attends Religious Services:   . Active Member of Clubs or Organizations:   . Attends Banker Meetings:   [name] Kitchen Marital Status:    Intimate Partner Violence:   . Fear of Current or Ex-Partner:   . Emotionally Abused:   [name] Kitchen Physically Abused:   . Sexually Abused:      Physical Exam: Ht 5' 8.5" (1.74 m)   Wt 115 lb (52.2 kg)   BMI 17.23 kg/m  Constitutional: generally well-appearing Psychiatric: alert and oriented x3 Abdomen: soft, nontender, nondistended, no obvious ascites, no peritoneal signs, normal bowel sounds No peripheral edema noted in lower extremities  Assessment and plan: 64 y.o. female with collagenous colitis, recent documented norovirus infection  Seems like she is back to  normal now after her acute vomiting, diarrheal gastroenteritis due to norovirus.  She is having 1 solid soft bowel movement every day or every other day.  I recommended she try to cut back her budesonide by taking 2 pills daily and then 1 pill and then 2 pills and then 1 pill alternating every other day between 2 and 1 pills.  She will call to report on her response to this slight reduction therapy in about 4 weeks.  If she is doing just as well I will probably increase her further to 1 pill daily only.  Please see the "Patient Instructions" section for addition details about the plan.  Rob Bunting, MD [facility] 11/08/2019, 3:39 PM   Total time on date of encounter was 30 minutes (this included time spent preparing to see the patient reviewing records; obtaining and/or reviewing separately obtained history; performing a medically appropriate exam and/or evaluation; counseling and educating the patient and family if present; ordering medications, tests or procedures if applicable; and documenting clinical information in the health record).

## 2019-11-09 NOTE — Progress Notes (Signed)
Any idea why Chromium, Cobalt labs were cancelled?

## 2019-11-23 ENCOUNTER — Other Ambulatory Visit: Payer: Self-pay

## 2019-11-23 ENCOUNTER — Ambulatory Visit
Admission: RE | Admit: 2019-11-23 | Discharge: 2019-11-23 | Disposition: A | Discharge status: Home / Self Care | Payer: 59 | Source: Ambulatory Visit | Attending: Orthopaedic Surgery | Admitting: Orthopaedic Surgery

## 2019-11-23 DIAGNOSIS — M25552 Pain in left hip: Secondary | ICD-10-CM

## 2019-11-29 ENCOUNTER — Ambulatory Visit (INDEPENDENT_AMBULATORY_CARE_PROVIDER_SITE_OTHER): Payer: 59 | Admitting: Orthopaedic Surgery

## 2019-11-29 ENCOUNTER — Encounter: Payer: Self-pay | Admitting: Orthopaedic Surgery

## 2019-11-29 VITALS — Ht 68.5 in | Wt 112.0 lb

## 2019-11-29 DIAGNOSIS — M25552 Pain in left hip: Secondary | ICD-10-CM | POA: Diagnosis not present

## 2019-11-29 NOTE — Progress Notes (Signed)
Office Visit Note   Patient: Angelica Stone           Date of Birth: [birth year]           MRN: [id number] Visit Date: 11/29/2019              Requested by: Lewis Moccasin, MD [address] [place],  [place] [address] PCP: Lewis Moccasin, MD   Assessment & Plan: Visit Diagnoses:  1. 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  Bone scan was negative for loosening.  Infectious labs were negative.  Unfortunately the cobalt chromium levels were incorrectly obtained so we will have to recollect these at [facility].  In terms of her chronic pain from her [place] hip resurfacing and the recent MRI findings I will refer the patient to Dr. Turner Daniels for surgical consultation.  Questions encouraged and answered.  Follow-Up Instructions: Return if symptoms worsen or fail to improve.   Orders:  Orders Placed This Encounter  Procedures  . Chromium level  . Cobalt  . Ambulatory referral to Orthopedic Surgery   No orders of the defined types were placed in this encounter.     Procedures: No procedures performed   Clinical Data: No additional findings.   Subjective: Chief Complaint  Patient presents with  . Left Hip - Pain, Follow-up    MRI review    Angelica Stone returns today for further review of her left hip MRI.  Her sister accompanies her today from [place].  No changes.   Review of Systems  Constitutional: Negative.   HENT: Negative.   Eyes: Negative.   Respiratory: Negative.   Cardiovascular: Negative.   Endocrine: Negative.   Musculoskeletal: Negative.   Neurological: Negative.   Hematological: Negative.   Psychiatric/Behavioral: Negative.   All other systems reviewed and are negative.    Objective: Vital Signs: Ht 5' 8.5" (1.74 m)   Wt 112 lb (50.8 kg)   BMI 16.78 kg/m   Physical Exam Vitals and nursing note reviewed.  Constitutional:      Appearance: She is  well-developed.  Pulmonary:     Effort: Pulmonary effort is normal.  Skin:    General: Skin is warm.     Capillary Refill: Capillary refill takes less than 2 seconds.  Neurological:     Mental Status: She is alert and oriented to person, place, and time.  Psychiatric:        Behavior: Behavior normal.        Thought Content: Thought content normal.        Judgment: Judgment normal.     Ortho Exam Left hip exam is unchanged. Specialty Comments:  No specialty comments available.  Imaging: No results found.   PMFS History: Patient Active Problem List   Diagnosis Date Noted  . Pain in left hip 10/25/2019  . Chronic right shoulder pain 10/25/2019  . Viral gastroenteritis 09/06/2019  . AKI (acute kidney injury) (HCC) 09/04/2019  . Nausea vomiting and diarrhea 09/04/2019  . Colitis 09/04/2019  . Migraine 09/04/2019  . Anxiety 09/04/2019  . Estrogen deficiency 09/04/2019   Past Medical History:  Diagnosis Date  . Anxiety   . Arthritis   . Depression   . Hypothyroidism   . Kidney stones   . Migraines   . Pneumonia     Family History  Problem Relation Age of Onset  . Heart disease  Mother   . Dementia Mother   . Arthritis Mother   . Bladder Cancer Father   . Diabetes Father   . Heart disease Father   . Arthritis Father   . Diabetes Sister   . Arthritis Sister   . Dementia Maternal Grandmother   . Heart disease Maternal Grandfather   . Diabetes Paternal Grandfather   . Diabetes Sister   . Arthritis Sister   . Arthritis Sister   . Dementia Maternal Aunt        x 2    Past Surgical History:  Procedure Laterality Date  . HIP RESURFACING Left 2018  . TOTAL ABDOMINAL HYSTERECTOMY  2006   Social History   Occupational History  . Occupation: Presenter, broadcasting  Tobacco Use  . Smoking status: Former Smoker    Types: Cigarettes    Quit date: 02/20/2016    Years since quitting: 3.7  . Smokeless tobacco: Never Used  Vaping Use  . Vaping Use: Never used    Substance and Sexual Activity  . Alcohol use: Yes    Comment: rarely on occasions  . Drug use: Not on file  . Sexual activity: Not on file

## 2019-12-02 ENCOUNTER — Other Ambulatory Visit: Payer: Self-pay | Admitting: Orthopaedic Surgery

## 2019-12-04 LAB — CHROMIUM AND COBALT, WB (MOM)
Chromium: 3.6 ng/mL — ABNORMAL HIGH (ref <3.0)
Cobalt: 5.7 ng/mL — ABNORMAL HIGH (ref <3.0)

## 2019-12-05 NOTE — Progress Notes (Signed)
Please forward the results to Dr. Wadie Lessen office at [facility] orth[facility]

## 2019-12-05 NOTE — Progress Notes (Signed)
Lab results has been forwarded to [facility] orth[facility] to attn Dr. Turner Daniels

## 2019-12-17 ENCOUNTER — Other Ambulatory Visit: Payer: Self-pay | Admitting: Orthopedic Surgery

## 2019-12-24 ENCOUNTER — Encounter (HOSPITAL_COMMUNITY): Payer: Self-pay

## 2019-12-24 NOTE — Progress Notes (Addendum)
COVID Vaccine Completed: Yesw Date COVID Vaccine completed: 09/16/19, 10/07/19 COVID vaccine manufacturer: Pfizer      PCP - Dr. Loreen Freud Cardiologist -   Chest x-ray -  EKG - 09/05/2019 in epic Stress Test -  ECHO -  Cardiac Cath -   Sleep Study -  CPAP -   Fasting Blood Sugar -  Checks Blood Sugar _____ times a day  Blood Thinner Instructions: Aspirin Instructions: Last Dose:  Anesthesia review:   Patient denies shortness of breath, fever, cough and chest pain at PAT appointment   Patient verbalized understanding of instructions that were given to them at the PAT appointment. Patient was also instructed that they will need to review over the PAT instructions again at home before surgery.

## 2019-12-24 NOTE — Patient Instructions (Addendum)
DUE TO COVID-19 ONLY ONE VISITOR ARE ALLOWED TO COME WITH YOU AND STAY IN THE WAITING ROOM ONLY DURING PRE OP AND PROCEDURE. THEN TWO VISITORS MAY VISIT WITH YOU IN YOUR PRIVATE ROOM DURING VISITING HOURS ONLY!! (10AM-8PM)   COVID SWAB TESTING MUST BE COMPLETED ON: Thursday, December 26, 2019  @ 3:00 PM [address], [place] [facility] enter pre surgical testing line (Must self quarantine after testing. Follow instructions on handout.)             Your procedure is scheduled on: Monday, December 30, 2019   [facility]  Entr[facility]    Report to admitting at 9;00 AM   Call this number if you have problems the morning of surgery [phone]   Do not eat food:After Midnight.   May have liquids until 8:30 AM   day of surgery   CLEAR LIQUID DIET  Foods Allowed                                                                     Foods Excluded  Water, Black Coffee and tea, regular and decaf                             liquids that you cannot  Plain Jell-O in any flavor  (No red)                                           see through such as: Fruit ices (not with fruit pulp)                                     milk, soups, orange juice  Iced Popsicles (No red)                                    All solid food                                   Apple juices Sports drinks like Gatorade (No red) Lightly seasoned clear broth or consume(fat free) Sugar, honey syrup  Sample Menu Breakfast                                Lunch                                     Supper Cranberry juice                    Beef broth                            Chicken broth Jell-O  Grape juice                           Apple juice Coffee or tea                        Jell-O                                      Popsicle                                                Coffee or tea                        Coffee or tea   Complete one Ensure  drink the morning of surgery at 8:30 AM the day of surgery.   Oral Hygiene is also important to reduce your risk of infection.                                    Remember - BRUSH YOUR TEETH THE MORNING OF SURGERY WITH YOUR REGULAR TOOTHPASTE   Do NOT smoke after Midnight   Take these medicines the morning of surgery with A SIP OF WATER: Citalopram, Levothyroxine, Topiramate                               You may not have any metal on your body including hair pins, jewelry, and body piercings             Do not wear make-up, lotions, powders, perfumes/cologne, or deodorant             Do not wear nail polish.  Do not shave  48 hours prior to surgery.                Do not bring valuables to the hospital. [facility] IS NOT             RESPONSIBLE   FOR VALUABLES.   Contacts, dentures or bridgework may not be worn into surgery.   Bring small overnight bag day of surgery.    Special Instructions: Bring a copy of your healthcare power of attorney and living will documents         the day of surgery if you haven't scanned them in before.              Please read over the following fact sheets you were given: IF YOU HAVE QUESTIONS ABOUT YOUR PRE OP INSTRUCTIONS PLEASE CALL [phone]   [place] - Preparing for Surgery Before surgery, you can play an important role.  Because skin is not sterile, your skin needs to be as free of germs as possible.  You can reduce the number of germs on your skin by washing with CHG (chlorahexidine gluconate) soap before surgery.  CHG is an antiseptic cleaner which kills germs and bonds with the skin to continue killing germs even after washing. Please DO NOT use if you have an allergy to CHG or antibacterial soaps.  If your skin becomes reddened/irritated stop using  the CHG and inform your nurse when you arrive at Short Stay. Do not shave (including legs and underarms) for at least 48 hours prior to the first CHG shower.  You may shave your  face/neck.  Please follow these instructions carefully:  1.  Shower with CHG Soap the night before surgery and the  morning of surgery.  2.  If you choose to wash your hair, wash your hair first as usual with your normal  shampoo.  3.  After you shampoo, rinse your hair and body thoroughly to remove the shampoo.                             4.  Use CHG as you would any other liquid soap.  You can apply chg directly to the skin and wash.  Gently with a scrungie or clean washcloth.  5.  Apply the CHG Soap to your body ONLY FROM THE NECK DOWN.   Do   not use on face/ open                           Wound or open sores. Avoid contact with eyes, ears mouth and   genitals (private parts).                       Wash face,  Genitals (private parts) with your normal soap.             6.  Wash thoroughly, paying special attention to the area where your    surgery  will be performed.  7.  Thoroughly rinse your body with warm water from the neck down.  8.  DO NOT shower/wash with your normal soap after using and rinsing off the CHG Soap.                9.  Pat yourself dry with a clean towel.            10.  Wear clean pajamas.            11.  Place clean sheets on your bed the night of your first shower and do not  sleep with pets. Day of Surgery : Do not apply any lotions/deodorants the morning of surgery.  Please wear clean clothes to the hospital/surgery center.  FAILURE TO FOLLOW THESE INSTRUCTIONS MAY RESULT IN THE CANCELLATION OF YOUR SURGERY  PATIENT SIGNATURE_________________________________  NURSE SIGNATURE__________________________________  ________________________________________________________________________   Angelica Stone  An incentive spirometer is a tool that can help keep your lungs clear and active. This tool measures how well you are filling your lungs with each breath. Taking long deep breaths may help reverse or decrease the chance of developing breathing (pulmonary)  problems (especially infection) following:  A long period of time when you are unable to move or be active. BEFORE THE PROCEDURE   If the spirometer includes an indicator to show your best effort, your nurse or respiratory therapist will set it to a desired goal.  If possible, sit up straight or lean slightly forward. Try not to slouch.  Hold the incentive spirometer in an upright position. INSTRUCTIONS FOR USE  1. Sit on the edge of your bed if possible, or sit up as far as you can in bed or on a chair. 2. Hold the incentive spirometer in an upright position. 3. Breathe out normally. 4. Place the mouthpiece in your  mouth and seal your lips tightly around it. 5. Breathe in slowly and as deeply as possible, raising the piston or the ball toward the top of the column. 6. Hold your breath for 3-5 seconds or for as long as possible. Allow the piston or ball to fall to the bottom of the column. 7. Remove the mouthpiece from your mouth and breathe out normally. 8. Rest for a few seconds and repeat Steps 1 through 7 at least 10 times every 1-2 hours when you are awake. Take your time and take a few normal breaths between deep breaths. 9. The spirometer may include an indicator to show your best effort. Use the indicator as a goal to work toward during each repetition. 10. After each set of 10 deep breaths, practice coughing to be sure your lungs are clear. If you have an incision (the cut made at the time of surgery), support your incision when coughing by placing a pillow or rolled up towels firmly against it. Once you are able to get out of bed, walk around indoors and cough well. You may stop using the incentive spirometer when instructed by your caregiver.  RISKS AND COMPLICATIONS  Take your time so you do not get dizzy or light-headed.  If you are in pain, you may need to take or ask for pain medication before doing incentive spirometry. It is harder to take a deep breath if you are having  pain. AFTER USE  Rest and breathe slowly and easily.  It can be helpful to keep track of a log of your progress. Your caregiver can provide you with a simple table to help with this. If you are using the spirometer at home, follow these instructions: SEEK MEDICAL CARE IF:   You are having difficultly using the spirometer.  You have trouble using the spirometer as often as instructed.  Your pain medication is not giving enough relief while using the spirometer.  You develop fever of 100.5 F (38.1 C) or higher. SEEK IMMEDIATE MEDICAL CARE IF:   You cough up bloody sputum that had not been present before.  You develop fever of 102 F (38.9 C) or greater.  You develop worsening pain at or near the incision site. MAKE SURE YOU:   Understand these instructions.  Will watch your condition.  Will get help right away if you are not doing well or get worse. Document Released: 10/03/2006 Document Revised: 08/15/2011 Document Reviewed: 12/04/2006 ExitCare Patient Information 2014 ExitCare, [place].   ________________________________________________________________________  WHAT IS A BLOOD TRANSFUSION? Blood Transfusion Information  A transfusion is the replacement of blood or some of its parts. Blood is made up of multiple cells which provide different functions.  Red blood cells carry oxygen and are used for blood loss replacement.  White blood cells fight against infection.  Platelets control bleeding.  Plasma helps clot blood.  Other blood products are available for specialized needs, such as hemophilia or other clotting disorders. BEFORE THE TRANSFUSION  Who gives blood for transfusions?   Healthy volunteers who are fully evaluated to make sure their blood is safe. This is blood bank blood. Transfusion therapy is the safest it has ever been in the practice of medicine. Before blood is taken from a donor, a complete history is taken to make sure that person has no history  of diseases nor engages in risky social behavior (examples are intravenous drug use or sexual activity with multiple partners). The donor's travel history is screened to minimize risk of  transmitting infections, such as malaria. The donated blood is tested for signs of infectious diseases, such as HIV and hepatitis. The blood is then tested to be sure it is compatible with you in order to minimize the chance of a transfusion reaction. If you or a relative donates blood, this is often done in anticipation of surgery and is not appropriate for emergency situations. It takes many days to process the donated blood. RISKS AND COMPLICATIONS Although transfusion therapy is very safe and saves many lives, the main dangers of transfusion include:   Getting an infectious disease.  Developing a transfusion reaction. This is an allergic reaction to something in the blood you were given. Every precaution is taken to prevent this. The decision to have a blood transfusion has been considered carefully by your caregiver before blood is given. Blood is not given unless the benefits outweigh the risks. AFTER THE TRANSFUSION  Right after receiving a blood transfusion, you will usually feel much better and more energetic. This is especially true if your red blood cells have gotten low (anemic). The transfusion raises the level of the red blood cells which carry oxygen, and this usually causes an energy increase.  The nurse administering the transfusion will monitor you carefully for complications. HOME CARE INSTRUCTIONS  No special instructions are needed after a transfusion. You may find your energy is better. Speak with your caregiver about any limitations on activity for underlying diseases you may have. SEEK MEDICAL CARE IF:   Your condition is not improving after your transfusion.  You develop redness or irritation at the intravenous (IV) site. SEEK IMMEDIATE MEDICAL CARE IF:  Any of the following symptoms  occur over the next 12 hours:  Shaking chills.  You have a temperature by mouth above 102 F (38.9 C), not controlled by medicine.  Chest, back, or muscle pain.  People around you feel you are not acting correctly or are confused.  Shortness of breath or difficulty breathing.  Dizziness and fainting.  You get a rash or develop hives.  You have a decrease in urine output.  Your urine turns a dark color or changes to pink, red, or brown. Any of the following symptoms occur over the next 10 days:  You have a temperature by mouth above 102 F (38.9 C), not controlled by medicine.  Shortness of breath.  Weakness after normal activity.  The white part of the eye turns yellow (jaundice).  You have a decrease in the amount of urine or are urinating less often.  Your urine turns a dark color or changes to pink, red, or brown. Document Released: 05/20/2000 Document Revised: 08/15/2011 Document Reviewed: 01/07/2008 [facility] Patient Information 2014 [place], [place].  _______________________________________________________________________

## 2019-12-26 ENCOUNTER — Other Ambulatory Visit: Payer: Self-pay

## 2019-12-26 ENCOUNTER — Ambulatory Visit (HOSPITAL_COMMUNITY)
Admission: RE | Admit: 2019-12-26 | Discharge: 2019-12-26 | Disposition: A | Discharge status: Home / Self Care | Payer: 59 | Source: Ambulatory Visit | Attending: Orthopedic Surgery | Admitting: Orthopedic Surgery

## 2019-12-26 ENCOUNTER — Encounter (HOSPITAL_COMMUNITY): Payer: Self-pay

## 2019-12-26 ENCOUNTER — Other Ambulatory Visit (HOSPITAL_COMMUNITY)
Admission: RE | Admit: 2019-12-26 | Discharge: 2019-12-26 | Disposition: A | Discharge status: Home / Self Care | Payer: 59 | Source: Ambulatory Visit | Attending: Orthopedic Surgery | Admitting: Orthopedic Surgery

## 2019-12-26 ENCOUNTER — Encounter (HOSPITAL_COMMUNITY)
Admission: RE | Admit: 2019-12-26 | Discharge: 2019-12-26 | Disposition: A | Discharge status: Home / Self Care | Payer: 59 | Source: Ambulatory Visit | Attending: Orthopedic Surgery | Admitting: Orthopedic Surgery

## 2019-12-26 DIAGNOSIS — Z01818 Encounter for other preprocedural examination: Secondary | ICD-10-CM

## 2019-12-26 DIAGNOSIS — Z20822 Contact with and (suspected) exposure to covid-19: Secondary | ICD-10-CM | POA: Diagnosis not present

## 2019-12-26 HISTORY — DX: Collagenous colitis: K52.831

## 2019-12-26 HISTORY — DX: Calculus of gallbladder without cholecystitis without obstruction: K80.20

## 2019-12-26 HISTORY — DX: Effusion, right ankle: M25.471

## 2019-12-26 HISTORY — DX: Effusion, left ankle: M25.472

## 2019-12-26 HISTORY — DX: Personal history of urinary calculi: Z87.442

## 2019-12-26 HISTORY — DX: Varicose veins of left lower extremity with other complications: I83.892

## 2019-12-26 HISTORY — DX: Nonrheumatic mitral (valve) prolapse: I34.1

## 2019-12-26 LAB — URINALYSIS, ROUTINE W REFLEX MICROSCOPIC
Bilirubin Urine: NEGATIVE
Glucose, UA: NEGATIVE mg/dL
Ketones, ur: NEGATIVE mg/dL
Leukocytes,Ua: NEGATIVE
Nitrite: NEGATIVE
Protein, ur: NEGATIVE mg/dL
Specific Gravity, Urine: 1.013 (ref 1.005–1.030)
pH: 6 (ref 5.0–8.0)

## 2019-12-26 LAB — SARS CORONAVIRUS 2 (TAT 6-24 HRS): SARS Coronavirus 2: NEGATIVE

## 2019-12-26 LAB — CBC WITH DIFFERENTIAL/PLATELET
Abs Immature Granulocytes: 0.02 10*3/uL (ref 0.00–0.07)
Basophils Absolute: 0.1 10*3/uL (ref 0.0–0.1)
Basophils Relative: 1 %
Eosinophils Absolute: 0.1 10*3/uL (ref 0.0–0.5)
Eosinophils Relative: 2 %
HCT: 43 % (ref 36.0–46.0)
Hemoglobin: 14.1 g/dL (ref 12.0–15.0)
Immature Granulocytes: 0 %
Lymphocytes Relative: 22 %
Lymphs Abs: 1.9 10*3/uL (ref 0.7–4.0)
MCH: 33.9 pg (ref 26.0–34.0)
MCHC: 32.8 g/dL (ref 30.0–36.0)
MCV: 103.4 fL — ABNORMAL HIGH (ref 80.0–100.0)
Monocytes Absolute: 0.8 10*3/uL (ref 0.1–1.0)
Monocytes Relative: 9 %
Neutro Abs: 5.6 10*3/uL (ref 1.7–7.7)
Neutrophils Relative %: 66 %
Platelets: 250 10*3/uL (ref 150–400)
RBC: 4.16 MIL/uL (ref 3.87–5.11)
RDW: 12.6 % (ref 11.5–15.5)
WBC: 8.4 10*3/uL (ref 4.0–10.5)
nRBC: 0 % (ref 0.0–0.2)

## 2019-12-26 LAB — BASIC METABOLIC PANEL
Anion gap: 11 (ref 5–15)
BUN: 11 mg/dL (ref 8–23)
CO2: 24 mmol/L (ref 22–32)
Calcium: 9.8 mg/dL (ref 8.9–10.3)
Chloride: 106 mmol/L (ref 98–111)
Creatinine, Ser: 0.81 mg/dL (ref 0.44–1.00)
GFR calc Af Amer: >60 mL/min (ref >60)
GFR calc non Af Amer: >60 mL/min (ref >60)
Glucose, Bld: 88 mg/dL (ref 70–99)
Potassium: 4.2 mmol/L (ref 3.5–5.1)
Sodium: 141 mmol/L (ref 135–145)

## 2019-12-26 LAB — PROTIME-INR
INR: 0.9 (ref 0.8–1.2)
Prothrombin Time: 12.2 seconds (ref 11.4–15.2)

## 2019-12-26 LAB — SURGICAL PCR SCREEN
MRSA, PCR: NEGATIVE
Staphylococcus aureus: NEGATIVE

## 2019-12-26 LAB — APTT: aPTT: 28 seconds (ref 24–36)

## 2019-12-26 NOTE — Progress Notes (Signed)
COVID Vaccine Completed: Yes Date COVID Vaccine completed: 09/16/19, 10/07/19 COVID vaccine manufacturer: Pfizer      PCP - Dr. Loreen Freud Cardiologist - [place], [place] over 10 years ago.    Chest x-ray -  12-26-19 EKG - 09/05/2019 in epic Stress Test -  N/A ECHO -  N/A Cardiac Cath - 10+ years ago  Sleep Study - N/A CPAP -  N/A  Fasting Blood Sugar -  N/A Checks Blood Sugar _____ times a day  Blood Thinner Instructions: N/A Aspirin Instructions: N/A Last Dose:  Anesthesia review:   Patient denies shortness of breath, fever, cough and chest pain at PAT appointment   Patient verbalized understanding of instructions that were given to them at the PAT appointment. Patient was also instructed that they will need to review over the PAT instructions again at home before surgery.    Revision History

## 2019-12-26 NOTE — Progress Notes (Signed)
Left message for Lurena Joiner at Dr. Wadie Lessen office regarding incorrect consent information in Epic.  Patient is having a left hip and consent states left knee.

## 2019-12-27 DIAGNOSIS — Z96649 Presence of unspecified artificial hip joint: Secondary | ICD-10-CM

## 2019-12-27 DIAGNOSIS — T84018A Broken internal joint prosthesis, other site, initial encounter: Secondary | ICD-10-CM

## 2019-12-27 NOTE — Anesthesia Preprocedure Evaluation (Addendum)
Anesthesia Evaluation  Patient identified by MRN, date of birth, ID band Patient awake    Reviewed: Allergy & Precautions, NPO status , Patient's Chart, lab work & pertinent test results  History of Anesthesia Complications Negative for: history of anesthetic complications  Airway Mallampati: II  TM Distance: >3 FB Neck ROM: Full    Dental  (+) Missing,    Pulmonary former smoker,    Pulmonary exam normal        Cardiovascular negative cardio ROS Normal cardiovascular exam     Neuro/Psych  Headaches, Anxiety Depression    GI/Hepatic negative GI ROS, Neg liver ROS,   Endo/Other  Hypothyroidism   Renal/GU negative Renal ROS  negative genitourinary   Musculoskeletal  (+) Arthritis ,   Abdominal   Peds  Hematology negative hematology ROS (+)   Anesthesia Other Findings Day of surgery medications reviewed with patient.  Reproductive/Obstetrics negative OB ROS                            Anesthesia Physical Anesthesia Plan  ASA: II  Anesthesia Plan: Spinal   Post-op Pain Management:    Induction:   PONV Risk Score and Plan: 3 and Treatment may vary due to age or medical condition, Ondansetron, Propofol infusion, Dexamethasone and Midazolam  Airway Management Planned: Natural Airway and Simple Face Mask  Additional Equipment: None  Intra-op Plan:   Post-operative Plan:   Informed Consent: I have reviewed the patients History and Physical, chart, labs and discussed the procedure including the risks, benefits and alternatives for the proposed anesthesia with the patient or authorized representative who has indicated his/her understanding and acceptance.       Plan Discussed with: CRNA  Anesthesia Plan Comments:        Anesthesia Quick Evaluation

## 2019-12-27 NOTE — H&P (Signed)
TOTAL HIP REVISION ADMISSION H&P  Patient is admitted for left revision total hip arthroplasty.  Subjective:  Chief Complaint: left hip pain  HPI: Angelica Stone, 64 y.o. female, has a history of pain and functional disability in the left hip due to arthritis and failed left hip resurfacing and patient has failed non-surgical conservative treatments for greater than 12 weeks to include NSAID's and/or analgesics, use of assistive devices, weight reduction as appropriate and activity modification. The indications for the revision total hip arthroplasty are bearing surface wear leading to  symptomatic synovitis.  Onset of symptoms was gradual starting 1 years ago with gradually worsening course since that time.  Prior procedures on the left hip include arthroplasty.  Patient currently rates pain in the left hip at 10 out of 10 with activity.  There is night pain, worsening of pain with activity and weight bearing, trendelenberg gait, pain that interfers with activities of daily living and pain with passive range of motion. 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  This condition presents safety issues increasing the risk of falls.   There is no current active infection.  Patient Active Problem List   Diagnosis Date Noted  . Pain in left hip 10/25/2019  . Chronic right shoulder pain 10/25/2019  . Viral gastroenteritis 09/06/2019  . AKI (acute kidney injury) (HCC) 09/04/2019  . Nausea vomiting and diarrhea 09/04/2019  . Colitis 09/04/2019  . Migraine 09/04/2019  . Anxiety 09/04/2019  . Estrogen deficiency 09/04/2019   Past Medical History:  Diagnosis Date  . Anxiety   . Arthritis   . Collagenous colitis   . Depression   . Gallstone   . History of kidney stones   . Hypothyroidism   . Migraines   . MVP (mitral valve prolapse)    early 20's  . Pneumonia   . Swelling of both ankles   . Varicose veins of left leg with edema     Past Surgical History:  Procedure Laterality  Date  . BARTHOLIN GLAND CYST EXCISION Bilateral   . COLONOSCOPY    . HIP RESURFACING Left 2018  . SALPINGECTOMY Left   . TOTAL ABDOMINAL HYSTERECTOMY  2006  . UPPER GI ENDOSCOPY      No current facility-administered medications for this encounter.   Current Outpatient Medications  Medication Sig Dispense Refill Last Dose  . budesonide (ENTOCORT EC) 3 MG 24 hr capsule Take 2 capsules alternating with 1 capsule x 1 month 60 capsule 6   . calcium carbonate (OSCAL) 1500 (600 Ca) MG TABS tablet Take 600 mg of elemental calcium by mouth daily.      . Cholecalciferol (VITAMIN D3) 125 MCG (5000 UT) CAPS Take 5,000 Units by mouth daily.      . citalopram (CELEXA) 40 MG tablet Take 40 mg by mouth daily.     . cyanocobalamin 2000 MCG tablet Take 2,000 mcg by mouth daily.     [name] Kitchen levothyroxine (SYNTHROID) 112 MCG tablet Take 112 mcg by mouth daily before breakfast.      . Multiple Vitamin (MULTIVITAMIN) tablet Take 1 tablet by mouth daily.     [name] Kitchen topiramate (TOPAMAX) 50 MG tablet Take 50 mg by mouth 2 (two) times daily.     [name] Kitchen ZOLMitriptan (ZOMIG) 2.5 MG tablet Take 2.5 mg by mouth as needed for migraine.      . Multiple Vitamin (MULTIVITAMIN WITH MINERALS) TABS tablet Take 1 tablet by mouth daily. Centrum (Patient not taking: Reported on 12/18/2019)   Not  Taking at Unknown time  . Multiple Vitamins-Minerals (ZINC PO) Take 1 tablet by mouth daily. (Patient not taking: Reported on 12/18/2019)   Not Taking at Unknown time  . ondansetron (ZOFRAN-ODT) 4 MG disintegrating tablet Take 1 tablet (4 mg total) by mouth every 8 (eight) hours as needed for nausea or vomiting. (Patient not taking: Reported on 12/18/2019) 20 tablet 0 Not Taking at Unknown time  . potassium chloride (KLOR-CON) 10 MEQ tablet Take 2 tablets (20 mEq total) by mouth daily for 3 days. (Patient not taking: Reported on 12/18/2019) 6 tablet 0 Not Taking at Unknown time   Allergies  Allergen Reactions  . Sulfa Antibiotics Swelling and Rash     Swelling around mouth and throat    Social History   Tobacco Use  . Smoking status: Former Smoker    Types: Cigarettes    Quit date: 02/20/2016    Years since quitting: 3.8  . Smokeless tobacco: Never Used  Substance Use Topics  . Alcohol use: Yes    Comment: rarely on occasions    Family History  Problem Relation Age of Onset  . Heart disease Mother   . Dementia Mother   . Arthritis Mother   . Bladder Cancer Father   . Diabetes Father   . Heart disease Father   . Arthritis Father   . Diabetes Sister   . Arthritis Sister   . Dementia Maternal Grandmother   . Heart disease Maternal Grandfather   . Diabetes Paternal Grandfather   . Diabetes Sister   . Arthritis Sister   . Arthritis Sister   . Dementia Maternal Aunt        x 2      Review of Systems  Constitutional: Positive for fatigue.  HENT: Positive for hearing loss and tinnitus.   Eyes: Negative.        Poor vision  Respiratory: Negative.   Cardiovascular: Positive for leg swelling.  Gastrointestinal: Positive for diarrhea.  Endocrine: Negative.   Genitourinary: Negative.   Musculoskeletal: Positive for arthralgias.  Allergic/Immunologic: Negative.   Neurological: Positive for headaches.  Hematological: Bruises/bleeds easily.  Psychiatric/Behavioral:       Depression    Objective:  Physical Exam Constitutional:      Appearance: Normal appearance. She is normal weight.  HENT:     Head: Normocephalic and atraumatic.     Nose: Nose normal.     Mouth/Throat:     Mouth: Mucous membranes are moist.     Pharynx: Oropharynx is clear.  Eyes:     Pupils: Pupils are equal, round, and reactive to light.  Cardiovascular:     Pulses: Normal pulses.  Pulmonary:     Effort: Pulmonary effort is normal.  Musculoskeletal:        General: Tenderness present. Normal range of motion.     Cervical back: Normal range of motion and neck supple.     Comments: Left hip no obvious bony deformity, no ecchymosis.   Posterior surgical scar well-healed.  Limited range of motion due to pain in internal and external rotation.  4 out of 5 strength in hip flexion and abduction.  Tender to palpation along the posterior edge of the greater trochanter.  Skin:    General: Skin is warm and dry.  Neurological:     General: No focal deficit present.     Mental Status: She is alert and oriented to person, place, and time. Mental status is at baseline.  Psychiatric:  Mood and Affect: Mood normal.        Behavior: Behavior normal.        Thought Content: Thought content normal.        Judgment: Judgment normal.     Vital signs in last 24 hours:     Labs:   Estimated body mass index is 17.72 kg/m as calculated from the following:   Height as of 12/26/19: 5\' 9"  (1.753 m).   Weight as of 12/26/19: 54.4 kg.  Imaging Review:  An MRI of the patient's left hip dated 11/23/2019 was reviewed by me as well as Dr. 11/25/2019 showing pseudotumor wrapping around the posterior femur.  CT scan reviewed as well as bone scan by Dr. Turner Daniels which did not show any abnormalities.    Assessment/Plan:  End stage arthritis, left hip(s) with failed previous arthroplasty.  The patient history, physical examination, clinical judgement of the provider and imaging studies are consistent with end stage degenerative joint disease of the left hip(s), previous total hip arthroplasty. Revision total hip arthroplasty is deemed medically necessary. The treatment options including medical management, injection therapy, arthroscopy and arthroplasty were discussed at length. The risks and benefits of total hip arthroplasty were presented and reviewed. The risks due to aseptic loosening, infection, stiffness, dislocation/subluxation,  thromboembolic complications and other imponderables were discussed.  The patient acknowledged the explanation, agreed to proceed with the plan and consent was signed. Patient is being admitted for inpatient treatment  for surgery, pain control, PT, OT, prophylactic antibiotics, VTE prophylaxis, progressive ambulation and ADL's and discharge planning. The patient is planning to be discharged home with home health services

## 2019-12-27 NOTE — Progress Notes (Signed)
Called patient to inform her of time change for surgery on 12/30/19. Patient to arrive 0700 for 0920 surgery. To finish ERAS drink by [id number]. Patient verbalizes understanding.

## 2019-12-29 MED ORDER — TRANEXAMIC ACID 1000 MG/10ML IV SOLN
2000.0000 mg | INTRAVENOUS | Status: DC
  Filled 2019-12-29: qty 20

## 2019-12-29 MED ORDER — BUPIVACAINE LIPOSOME 1.3 % IJ SUSP
10.0000 mL | Freq: Once | INTRAMUSCULAR | Status: DC
  Filled 2019-12-29: qty 10

## 2019-12-30 ENCOUNTER — Ambulatory Visit (HOSPITAL_COMMUNITY): Payer: 59

## 2019-12-30 ENCOUNTER — Ambulatory Visit (HOSPITAL_COMMUNITY): Payer: 59 | Admitting: Anesthesiology

## 2019-12-30 ENCOUNTER — Other Ambulatory Visit: Payer: Self-pay

## 2019-12-30 ENCOUNTER — Encounter (HOSPITAL_COMMUNITY)
Admission: RE | Disposition: A | Discharge status: Home / Self Care | Payer: Self-pay | Source: Home / Self Care | Attending: Orthopedic Surgery

## 2019-12-30 ENCOUNTER — Encounter (HOSPITAL_COMMUNITY): Payer: Self-pay | Admitting: Orthopedic Surgery

## 2019-12-30 ENCOUNTER — Ambulatory Visit (HOSPITAL_COMMUNITY)
Admission: RE | Admit: 2019-12-30 | Discharge: 2020-01-01 | Disposition: A | Discharge status: Home / Self Care | Payer: 59 | Attending: Orthopedic Surgery | Admitting: Orthopedic Surgery

## 2019-12-30 DIAGNOSIS — Z79899 Other long term (current) drug therapy: Secondary | ICD-10-CM | POA: Insufficient documentation

## 2019-12-30 DIAGNOSIS — D62 Acute posthemorrhagic anemia: Secondary | ICD-10-CM | POA: Diagnosis not present

## 2019-12-30 DIAGNOSIS — Z7952 Long term (current) use of systemic steroids: Secondary | ICD-10-CM | POA: Insufficient documentation

## 2019-12-30 DIAGNOSIS — T84091A Other mechanical complication of internal left hip prosthesis, initial encounter: Secondary | ICD-10-CM | POA: Diagnosis not present

## 2019-12-30 DIAGNOSIS — M1612 Unilateral primary osteoarthritis, left hip: Secondary | ICD-10-CM | POA: Diagnosis not present

## 2019-12-30 DIAGNOSIS — G43909 Migraine, unspecified, not intractable, without status migrainosus: Secondary | ICD-10-CM | POA: Diagnosis not present

## 2019-12-30 DIAGNOSIS — T84018A Broken internal joint prosthesis, other site, initial encounter: Secondary | ICD-10-CM

## 2019-12-30 DIAGNOSIS — F419 Anxiety disorder, unspecified: Secondary | ICD-10-CM | POA: Diagnosis not present

## 2019-12-30 DIAGNOSIS — Z96642 Presence of left artificial hip joint: Secondary | ICD-10-CM | POA: Diagnosis not present

## 2019-12-30 DIAGNOSIS — M659 Synovitis and tenosynovitis, unspecified: Secondary | ICD-10-CM | POA: Insufficient documentation

## 2019-12-30 DIAGNOSIS — Z87891 Personal history of nicotine dependence: Secondary | ICD-10-CM | POA: Diagnosis not present

## 2019-12-30 DIAGNOSIS — Z419 Encounter for procedure for purposes other than remedying health state, unspecified: Secondary | ICD-10-CM

## 2019-12-30 DIAGNOSIS — F329 Major depressive disorder, single episode, unspecified: Secondary | ICD-10-CM | POA: Diagnosis not present

## 2019-12-30 DIAGNOSIS — I341 Nonrheumatic mitral (valve) prolapse: Secondary | ICD-10-CM | POA: Diagnosis not present

## 2019-12-30 DIAGNOSIS — E039 Hypothyroidism, unspecified: Secondary | ICD-10-CM | POA: Diagnosis not present

## 2019-12-30 DIAGNOSIS — Z882 Allergy status to sulfonamides status: Secondary | ICD-10-CM | POA: Diagnosis not present

## 2019-12-30 DIAGNOSIS — Y792 Prosthetic and other implants, materials and accessory orthopedic devices associated with adverse incidents: Secondary | ICD-10-CM | POA: Diagnosis not present

## 2019-12-30 DIAGNOSIS — Z8261 Family history of arthritis: Secondary | ICD-10-CM | POA: Insufficient documentation

## 2019-12-30 DIAGNOSIS — Z79890 Hormone replacement therapy (postmenopausal): Secondary | ICD-10-CM | POA: Insufficient documentation

## 2019-12-30 DIAGNOSIS — K52831 Collagenous colitis: Secondary | ICD-10-CM | POA: Insufficient documentation

## 2019-12-30 DIAGNOSIS — Z96649 Presence of unspecified artificial hip joint: Secondary | ICD-10-CM | POA: Diagnosis present

## 2019-12-30 DIAGNOSIS — T8484XA Pain due to internal orthopedic prosthetic devices, implants and grafts, initial encounter: Secondary | ICD-10-CM | POA: Insufficient documentation

## 2019-12-30 HISTORY — PX: TOTAL HIP REVISION: SHX763

## 2019-12-30 LAB — ABO/RH: ABO/RH(D): O POS

## 2019-12-30 LAB — HEMOGLOBIN AND HEMATOCRIT, BLOOD
HCT: 26 % — ABNORMAL LOW (ref 36.0–46.0)
Hemoglobin: 8.5 g/dL — ABNORMAL LOW (ref 12.0–15.0)

## 2019-12-30 SURGERY — TOTAL HIP REVISION
Anesthesia: Spinal | Site: Hip | Laterality: Left

## 2019-12-30 MED ORDER — METHOCARBAMOL 500 MG PO TABS
500.0000 mg | ORAL_TABLET | Freq: Four times a day (QID) | ORAL | Status: DC | PRN
  Administered 2019-12-31 – 2020-01-01 (×2): 500 mg via ORAL
  Filled 2019-12-30 (×2): qty 1

## 2019-12-30 MED ORDER — ORAL CARE MOUTH RINSE: 15.0000 mL | Freq: Once | OROMUCOSAL | Status: AC

## 2019-12-30 MED ORDER — BUPIVACAINE-EPINEPHRINE (PF) 0.25% -1:200000 IJ SOLN
INTRAMUSCULAR | Status: AC
  Filled 2019-12-30: qty 30

## 2019-12-30 MED ORDER — DEXAMETHASONE SODIUM PHOSPHATE 10 MG/ML IJ SOLN
INTRAMUSCULAR | Status: AC
  Filled 2019-12-30: qty 1

## 2019-12-30 MED ORDER — SODIUM CHLORIDE 0.9 % IV BOLUS
500.0000 mL | Freq: Once | INTRAVENOUS | Status: AC
  Administered 2019-12-30: 500 mL via INTRAVENOUS

## 2019-12-30 MED ORDER — PROMETHAZINE HCL 25 MG/ML IJ SOLN: 6.2500 mg | INTRAMUSCULAR | Status: DC | PRN

## 2019-12-30 MED ORDER — PHENYLEPHRINE HCL (PRESSORS) 10 MG/ML IV SOLN
INTRAVENOUS | Status: AC
  Filled 2019-12-30: qty 1

## 2019-12-30 MED ORDER — METOCLOPRAMIDE HCL 5 MG/ML IJ SOLN: 5.0000 mg | Freq: Three times a day (TID) | INTRAMUSCULAR | Status: DC | PRN

## 2019-12-30 MED ORDER — TOPIRAMATE 25 MG PO TABS
50.0000 mg | ORAL_TABLET | Freq: Two times a day (BID) | ORAL | Status: DC
  Administered 2019-12-30 – 2020-01-01 (×4): 50 mg via ORAL
  Filled 2019-12-30 (×4): qty 2

## 2019-12-30 MED ORDER — PROPOFOL 1000 MG/100ML IV EMUL
INTRAVENOUS | Status: AC
  Filled 2019-12-30: qty 100

## 2019-12-30 MED ORDER — OXYCODONE HCL 5 MG PO TABS: 5.0000 mg | ORAL_TABLET | Freq: Once | ORAL | Status: DC | PRN

## 2019-12-30 MED ORDER — ALBUMIN HUMAN 5 % IV SOLN: INTRAVENOUS | Status: DC | PRN

## 2019-12-30 MED ORDER — ONDANSETRON HCL 4 MG/2ML IJ SOLN
INTRAMUSCULAR | Status: DC | PRN
  Administered 2019-12-30: 4 mg via INTRAVENOUS

## 2019-12-30 MED ORDER — DOCUSATE SODIUM 100 MG PO CAPS
100.0000 mg | ORAL_CAPSULE | Freq: Two times a day (BID) | ORAL | Status: DC
  Administered 2019-12-30 – 2020-01-01 (×4): 100 mg via ORAL
  Filled 2019-12-30 (×4): qty 1

## 2019-12-30 MED ORDER — BUPIVACAINE LIPOSOME 1.3 % IJ SUSP
INTRAMUSCULAR | Status: DC | PRN
  Administered 2019-12-30: 20 mL

## 2019-12-30 MED ORDER — ASPIRIN 81 MG PO CHEW
81.0000 mg | CHEWABLE_TABLET | Freq: Two times a day (BID) | ORAL | Status: DC
  Administered 2019-12-30 – 2020-01-01 (×4): 81 mg via ORAL
  Filled 2019-12-30 (×4): qty 1

## 2019-12-30 MED ORDER — CITALOPRAM HYDROBROMIDE 20 MG PO TABS
40.0000 mg | ORAL_TABLET | Freq: Every day | ORAL | Status: DC
  Administered 2019-12-31 – 2020-01-01 (×2): 40 mg via ORAL
  Filled 2019-12-30 (×2): qty 2

## 2019-12-30 MED ORDER — POLYETHYLENE GLYCOL 3350 17 G PO PACK: 17.0000 g | PACK | Freq: Every day | ORAL | Status: DC | PRN

## 2019-12-30 MED ORDER — FENTANYL CITRATE (PF) 100 MCG/2ML IJ SOLN
INTRAMUSCULAR | Status: AC
  Filled 2019-12-30: qty 2

## 2019-12-30 MED ORDER — ONDANSETRON HCL 4 MG/2ML IJ SOLN
INTRAMUSCULAR | Status: AC
  Filled 2019-12-30: qty 2

## 2019-12-30 MED ORDER — SUMATRIPTAN SUCCINATE 50 MG PO TABS
50.0000 mg | ORAL_TABLET | ORAL | Status: DC | PRN
  Filled 2019-12-30: qty 1

## 2019-12-30 MED ORDER — OXYCODONE-ACETAMINOPHEN 5-325 MG PO TABS: 1.0000 | ORAL_TABLET | ORAL | 0 refills | Status: DC | PRN

## 2019-12-30 MED ORDER — ASPIRIN EC 81 MG PO TBEC
81.0000 mg | DELAYED_RELEASE_TABLET | Freq: Two times a day (BID) | ORAL | 0 refills | Status: DC
Start: 2019-12-30 — End: 2020-03-23

## 2019-12-30 MED ORDER — LACTATED RINGERS IV SOLN: INTRAVENOUS | Status: DC

## 2019-12-30 MED ORDER — BUPIVACAINE-EPINEPHRINE 0.25% -1:200000 IJ SOLN
INTRAMUSCULAR | Status: DC | PRN
  Administered 2019-12-30: 30 mL

## 2019-12-30 MED ORDER — ACETAMINOPHEN 500 MG PO TABS
1000.0000 mg | ORAL_TABLET | Freq: Four times a day (QID) | ORAL | Status: AC
  Administered 2019-12-30 – 2019-12-31 (×3): 1000 mg via ORAL
  Filled 2019-12-30 (×3): qty 2

## 2019-12-30 MED ORDER — CEFAZOLIN SODIUM-DEXTROSE 2-4 GM/100ML-% IV SOLN
2.0000 g | INTRAVENOUS | Status: AC
  Administered 2019-12-30: 2 g via INTRAVENOUS
  Filled 2019-12-30: qty 100

## 2019-12-30 MED ORDER — PROPOFOL 500 MG/50ML IV EMUL
INTRAVENOUS | Status: DC | PRN
  Administered 2019-12-30: 50 ug/kg/min via INTRAVENOUS

## 2019-12-30 MED ORDER — ONDANSETRON HCL 4 MG PO TABS: 4.0000 mg | ORAL_TABLET | Freq: Four times a day (QID) | ORAL | Status: DC | PRN

## 2019-12-30 MED ORDER — METHOCARBAMOL 500 MG IVPB - SIMPLE MED
INTRAVENOUS | Status: AC
  Filled 2019-12-30: qty 50

## 2019-12-30 MED ORDER — TRANEXAMIC ACID-NACL 1000-0.7 MG/100ML-% IV SOLN
1000.0000 mg | INTRAVENOUS | Status: AC
  Administered 2019-12-30: 1000 mg via INTRAVENOUS
  Filled 2019-12-30: qty 100

## 2019-12-30 MED ORDER — PROPOFOL 10 MG/ML IV BOLUS
INTRAVENOUS | Status: AC
  Filled 2019-12-30: qty 40

## 2019-12-30 MED ORDER — FENTANYL CITRATE (PF) 100 MCG/2ML IJ SOLN
25.0000 ug | INTRAMUSCULAR | Status: DC | PRN
  Administered 2019-12-30: 25 ug via INTRAVENOUS

## 2019-12-30 MED ORDER — BUPIVACAINE IN DEXTROSE 0.75-8.25 % IT SOLN
INTRATHECAL | Status: DC | PRN
  Administered 2019-12-30: 2 mL via INTRATHECAL

## 2019-12-30 MED ORDER — MIDAZOLAM HCL 2 MG/2ML IJ SOLN
INTRAMUSCULAR | Status: AC
  Filled 2019-12-30: qty 2

## 2019-12-30 MED ORDER — TRANEXAMIC ACID 1000 MG/10ML IV SOLN
INTRAVENOUS | Status: DC | PRN
  Administered 2019-12-30: 2000 mg via TOPICAL

## 2019-12-30 MED ORDER — ONDANSETRON HCL 4 MG/2ML IJ SOLN: 4.0000 mg | Freq: Four times a day (QID) | INTRAMUSCULAR | Status: DC | PRN

## 2019-12-30 MED ORDER — ALBUMIN HUMAN 5 % IV SOLN
INTRAVENOUS | Status: AC
  Filled 2019-12-30: qty 250

## 2019-12-30 MED ORDER — DEXAMETHASONE SODIUM PHOSPHATE 10 MG/ML IJ SOLN
10.0000 mg | Freq: Once | INTRAMUSCULAR | Status: AC
  Administered 2019-12-31: 10 mg via INTRAVENOUS
  Filled 2019-12-30: qty 1

## 2019-12-30 MED ORDER — MIDAZOLAM HCL 2 MG/2ML IJ SOLN
INTRAMUSCULAR | Status: DC | PRN
  Administered 2019-12-30 (×2): 1 mg via INTRAVENOUS

## 2019-12-30 MED ORDER — METHOCARBAMOL 500 MG IVPB - SIMPLE MED
500.0000 mg | Freq: Four times a day (QID) | INTRAVENOUS | Status: DC | PRN
  Filled 2019-12-30: qty 50

## 2019-12-30 MED ORDER — DEXAMETHASONE SODIUM PHOSPHATE 10 MG/ML IJ SOLN
INTRAMUSCULAR | Status: DC | PRN
  Administered 2019-12-30: 4 mg via INTRAVENOUS

## 2019-12-30 MED ORDER — FENTANYL CITRATE (PF) 100 MCG/2ML IJ SOLN
INTRAMUSCULAR | Status: DC | PRN
  Administered 2019-12-30: 25 ug via INTRAVENOUS
  Administered 2019-12-30: 50 ug via INTRAVENOUS
  Administered 2019-12-30: 25 ug via INTRAVENOUS

## 2019-12-30 MED ORDER — HYDROMORPHONE HCL 1 MG/ML IJ SOLN
0.5000 mg | INTRAMUSCULAR | Status: DC | PRN
  Administered 2019-12-30: 0.5 mg via INTRAVENOUS
  Filled 2019-12-30: qty 1

## 2019-12-30 MED ORDER — LIDOCAINE 2% (20 MG/ML) 5 ML SYRINGE
INTRAMUSCULAR | Status: AC
  Filled 2019-12-30: qty 5

## 2019-12-30 MED ORDER — PHENYLEPHRINE 40 MCG/ML (10ML) SYRINGE FOR IV PUSH (FOR BLOOD PRESSURE SUPPORT)
PREFILLED_SYRINGE | INTRAVENOUS | Status: AC
  Filled 2019-12-30: qty 10

## 2019-12-30 MED ORDER — PHENOL 1.4 % MT LIQD: 1.0000 | OROMUCOSAL | Status: DC | PRN

## 2019-12-30 MED ORDER — BUDESONIDE 3 MG PO CPEP
3.0000 mg | ORAL_CAPSULE | Freq: Every day | ORAL | Status: DC
  Administered 2019-12-30 – 2020-01-01 (×3): 3 mg via ORAL
  Filled 2019-12-30 (×3): qty 1

## 2019-12-30 MED ORDER — ALUMINUM HYDROXIDE GEL 320 MG/5ML PO SUSP
15.0000 mL | ORAL | Status: DC | PRN
  Filled 2019-12-30: qty 30

## 2019-12-30 MED ORDER — MENTHOL 3 MG MT LOZG: 1.0000 | LOZENGE | OROMUCOSAL | Status: DC | PRN

## 2019-12-30 MED ORDER — POVIDONE-IODINE 10 % EX SWAB
2.0000 "application " | Freq: Once | CUTANEOUS | Status: AC
  Administered 2019-12-30: 2 via TOPICAL

## 2019-12-30 MED ORDER — OXYCODONE HCL 5 MG PO TABS
5.0000 mg | ORAL_TABLET | ORAL | Status: DC | PRN
  Administered 2019-12-30: 5 mg via ORAL
  Administered 2019-12-31: 10 mg via ORAL
  Administered 2019-12-31 (×2): 5 mg via ORAL
  Administered 2019-12-31: 10 mg via ORAL
  Administered 2020-01-01: 5 mg via ORAL
  Filled 2019-12-30 (×4): qty 1
  Filled 2019-12-30 (×2): qty 2

## 2019-12-30 MED ORDER — GABAPENTIN 100 MG PO CAPS
100.0000 mg | ORAL_CAPSULE | Freq: Three times a day (TID) | ORAL | Status: DC
  Administered 2019-12-30 – 2020-01-01 (×6): 100 mg via ORAL
  Filled 2019-12-30 (×6): qty 1

## 2019-12-30 MED ORDER — FLEET ENEMA 7-19 GM/118ML RE ENEM: 1.0000 | ENEMA | Freq: Once | RECTAL | Status: DC | PRN

## 2019-12-30 MED ORDER — BISACODYL 5 MG PO TBEC: 5.0000 mg | DELAYED_RELEASE_TABLET | Freq: Every day | ORAL | Status: DC | PRN

## 2019-12-30 MED ORDER — PANTOPRAZOLE SODIUM 40 MG PO TBEC
40.0000 mg | DELAYED_RELEASE_TABLET | Freq: Every day | ORAL | Status: DC
  Administered 2019-12-30 – 2020-01-01 (×3): 40 mg via ORAL
  Filled 2019-12-30 (×3): qty 1

## 2019-12-30 MED ORDER — CHLORHEXIDINE GLUCONATE 0.12 % MT SOLN
15.0000 mL | Freq: Once | OROMUCOSAL | Status: AC
  Administered 2019-12-30: 15 mL via OROMUCOSAL

## 2019-12-30 MED ORDER — PHENYLEPHRINE HCL-NACL 10-0.9 MG/250ML-% IV SOLN
INTRAVENOUS | Status: DC | PRN
Start: 2019-12-30 — End: 2019-12-30
  Administered 2019-12-30: 20 ug/min via INTRAVENOUS

## 2019-12-30 MED ORDER — METOCLOPRAMIDE HCL 5 MG PO TABS: 5.0000 mg | ORAL_TABLET | Freq: Three times a day (TID) | ORAL | Status: DC | PRN

## 2019-12-30 MED ORDER — ACETAMINOPHEN 500 MG PO TABS
1000.0000 mg | ORAL_TABLET | Freq: Once | ORAL | Status: AC
  Administered 2019-12-30: 1000 mg via ORAL
  Filled 2019-12-30: qty 2

## 2019-12-30 MED ORDER — SODIUM CHLORIDE 0.9 % IR SOLN
Status: DC | PRN
  Administered 2019-12-30: 1000 mL

## 2019-12-30 MED ORDER — PHENYLEPHRINE 40 MCG/ML (10ML) SYRINGE FOR IV PUSH (FOR BLOOD PRESSURE SUPPORT)
PREFILLED_SYRINGE | INTRAVENOUS | Status: DC | PRN
  Administered 2019-12-30: 40 ug via INTRAVENOUS
  Administered 2019-12-30: 80 ug via INTRAVENOUS
  Administered 2019-12-30: 60 ug via INTRAVENOUS
  Administered 2019-12-30: 40 ug via INTRAVENOUS

## 2019-12-30 MED ORDER — ACETAMINOPHEN 325 MG PO TABS: 325.0000 mg | ORAL_TABLET | Freq: Four times a day (QID) | ORAL | Status: DC | PRN

## 2019-12-30 MED ORDER — TIZANIDINE HCL 2 MG PO TABS
2.0000 mg | ORAL_TABLET | Freq: Four times a day (QID) | ORAL | 0 refills | Status: DC | PRN
Start: 2019-12-30 — End: 2020-03-23

## 2019-12-30 MED ORDER — 0.9 % SODIUM CHLORIDE (POUR BTL) OPTIME
TOPICAL | Status: DC | PRN
  Administered 2019-12-30: 1000 mL

## 2019-12-30 MED ORDER — LEVOTHYROXINE SODIUM 112 MCG PO TABS
112.0000 ug | ORAL_TABLET | Freq: Every day | ORAL | Status: DC
  Administered 2019-12-31 – 2020-01-01 (×2): 112 ug via ORAL
  Filled 2019-12-30 (×2): qty 1

## 2019-12-30 MED ORDER — KCL IN DEXTROSE-NACL 20-5-0.45 MEQ/L-%-% IV SOLN
INTRAVENOUS | Status: DC
  Filled 2019-12-30 (×3): qty 1000

## 2019-12-30 MED ORDER — OXYCODONE HCL 5 MG/5ML PO SOLN: 5.0000 mg | Freq: Once | ORAL | Status: DC | PRN

## 2019-12-30 MED ORDER — DIPHENHYDRAMINE HCL 12.5 MG/5ML PO ELIX: 12.5000 mg | ORAL_SOLUTION | ORAL | Status: DC | PRN

## 2019-12-30 SURGICAL SUPPLY — 65 items
ACETAB CUP W/GRIPTION 54 (Plate) ×2 IMPLANT
BAG DECANTER FOR FLEXI CONT (MISCELLANEOUS) ×2 IMPLANT
BLADE SAW GIGLI 510 (BLADE) ×2 IMPLANT
BLADE SAW SAG 73X25 THK (BLADE)
BLADE SAW SGTL 73X25 THK (BLADE) IMPLANT
CHLORAPREP W/TINT 26 (MISCELLANEOUS) ×2 IMPLANT
COVER SURGICAL LIGHT HANDLE (MISCELLANEOUS) ×2 IMPLANT
COVER WAND RF STERILE (DRAPES) IMPLANT
CUP ACETAB W/GRIPTION 54 (Plate) ×1 IMPLANT
DECANTER SPIKE VIAL GLASS SM (MISCELLANEOUS) ×4 IMPLANT
DRAPE C-ARM 42X120 X-RAY (DRAPES) IMPLANT
DRAPE C-ARMOR (DRAPES) IMPLANT
DRAPE ORTHO SPLIT 77X108 STRL (DRAPES) ×2
DRAPE SHEET LG 3/4 BI-LAMINATE (DRAPES) ×2 IMPLANT
DRAPE SURG ORHT 6 SPLT 77X108 (DRAPES) ×2 IMPLANT
DRAPE U-SHAPE 47X51 STRL (DRAPES) ×2 IMPLANT
DRSG AQUACEL AG ADV 3.5X10 (GAUZE/BANDAGES/DRESSINGS) ×2 IMPLANT
ELECT BLADE TIP CTD 4 INCH (ELECTRODE) ×2 IMPLANT
ELECT REM PT RETURN 15FT ADLT (MISCELLANEOUS) ×2 IMPLANT
GAUZE SPONGE 4X4 12PLY STRL (GAUZE/BANDAGES/DRESSINGS) IMPLANT
GAUZE XEROFORM 5X9 LF (GAUZE/BANDAGES/DRESSINGS) IMPLANT
GLOVE BIO SURGEON STRL SZ7.5 (GLOVE) ×2 IMPLANT
GLOVE BIO SURGEON STRL SZ8.5 (GLOVE) ×2 IMPLANT
GLOVE BIOGEL PI IND STRL 8 (GLOVE) ×1 IMPLANT
GLOVE BIOGEL PI IND STRL 9 (GLOVE) ×1 IMPLANT
GLOVE BIOGEL PI INDICATOR 8 (GLOVE) ×1
GLOVE BIOGEL PI INDICATOR 9 (GLOVE) ×1
GOWN STRL REIN XL XLG (GOWN DISPOSABLE) ×4 IMPLANT
HEAD CERAMIC BIOLOX DELTA 36 6 (Hips) ×2 IMPLANT
HOLDER FOLEY CATH W/STRAP (MISCELLANEOUS) ×2 IMPLANT
HOOD PEEL AWAY FLYTE STAYCOOL (MISCELLANEOUS) ×8 IMPLANT
IMMOBILIZER KNEE 20 (SOFTGOODS)
IMMOBILIZER KNEE 20 THIGH 36 (SOFTGOODS) IMPLANT
IV NS IRRIG 3000ML ARTHROMATIC (IV SOLUTION) ×2 IMPLANT
KIT BASIN OR (CUSTOM PROCEDURE TRAY) ×2 IMPLANT
KIT TURNOVER KIT A (KITS) IMPLANT
LINER NEUTRAL 54X36MM PLUS 4 (Hips) ×2 IMPLANT
NDL SAFETY ECLIPSE 18X1.5 (NEEDLE) ×1 IMPLANT
NEEDLE HYPO 18GX1.5 SHARP (NEEDLE) ×1
NEEDLE HYPO 21X1.5 SAFETY (NEEDLE) ×2 IMPLANT
NEEDLE MAYO CATGUT SZ4 (NEEDLE) IMPLANT
NS IRRIG 1000ML POUR BTL (IV SOLUTION) ×2 IMPLANT
PACK TOTAL JOINT (CUSTOM PROCEDURE TRAY) ×2 IMPLANT
PASSER SUT SWANSON 36MM LOOP (INSTRUMENTS) IMPLANT
PENCIL SMOKE EVACUATOR (MISCELLANEOUS) IMPLANT
PROTECTOR NERVE ULNAR (MISCELLANEOUS) ×2 IMPLANT
SLEEVE FEM PROX 16F LRG (Hips) ×2 IMPLANT
SPONGE LAP 18X18 RF (DISPOSABLE) IMPLANT
STAPLER VISISTAT 35W (STAPLE) ×2 IMPLANT
STEM FEM MOD STD 36 11X16X150 (Hips) ×2 IMPLANT
SUT ETHIBOND 2 V 37 (SUTURE) ×6 IMPLANT
SUT VIC AB 0 CT1 36 (SUTURE) ×2 IMPLANT
SUT VIC AB 1 CTX 36 (SUTURE) ×1
SUT VIC AB 1 CTX36XBRD ANBCTR (SUTURE) ×1 IMPLANT
SUT VIC AB 3-0 CT1 27 (SUTURE) ×1
SUT VIC AB 3-0 CT1 TAPERPNT 27 (SUTURE) ×1 IMPLANT
SWAB COLLECTION DEVICE MRSA (MISCELLANEOUS) IMPLANT
SWAB CULTURE ESWAB REG 1ML (MISCELLANEOUS) IMPLANT
SYR CONTROL 10ML LL (SYRINGE) ×4 IMPLANT
TOWEL OR 17X26 10 PK STRL BLUE (TOWEL DISPOSABLE) ×2 IMPLANT
TOWEL OR NON WOVEN STRL DISP B (DISPOSABLE) ×2 IMPLANT
TOWER CARTRIDGE SMART MIX (DISPOSABLE) IMPLANT
TRAY FOLEY MTR SLVR 14FR STAT (SET/KITS/TRAYS/PACK) ×2 IMPLANT
WATER STERILE IRR 1000ML POUR (IV SOLUTION) ×4 IMPLANT
YANKAUER SUCT BULB TIP 10FT TU (MISCELLANEOUS) ×2 IMPLANT

## 2019-12-30 NOTE — Transfer of Care (Signed)
Immediate Anesthesia Transfer of Care Note  Patient: Angelica Stone  Procedure(s) Performed: LEFT TOTAL HIP REVISION (Left Hip)  Patient Location: PACU  Anesthesia Type:Spinal  Level of Consciousness: awake, alert  and patient cooperative  Airway & Oxygen Therapy: Patient Spontanous Breathing and Patient connected to face mask oxygen  Post-op Assessment: Report given to RN and Post -op Vital signs reviewed and stable  Post vital signs: Reviewed and stable  Last Vitals:  Vitals Value Taken Time  BP 95/75 12/30/19 1155  Temp    Pulse 76 12/30/19 1157  Resp 16 12/30/19 1157  SpO2 100 % 12/30/19 1157  Vitals shown include unvalidated device data.  Last Pain:  Vitals:   12/30/19 0746  TempSrc: Oral  PainSc:       Patients Stated Pain Goal: 3 (12/30/19 6761)  Complications: No complications documented.

## 2019-12-30 NOTE — Interval H&P Note (Signed)
History and Physical Interval Note:  12/30/2019 7:07 AM  Angelica Stone  has presented today for surgery, with the diagnosis of painfull left hip replacement.  The various methods of treatment have been discussed with the patient and family. After consideration of risks, benefits and other options for treatment, the patient has consented to  Procedure(s): LEFT TOTAL HIP REVISION (Left) as a surgical intervention.  The patient's history has been reviewed, patient examined, no change in status, stable for surgery.  I have reviewed the patient's chart and labs.  Questions were answered to the patient's satisfaction.     Nestor Lewandowsky

## 2019-12-30 NOTE — Evaluation (Signed)
Physical Therapy Evaluation Patient Details Name: Angelica Stone MRN: [id number] DOB: 15-Aug-1955 Today's Date: 12/30/2019   History of Present Illness  s/p L posterior THA revision; PMH: anxiety, L THA, migraines  Clinical Impression  Pt is s/p THA resulting in the deficits listed below (see PT Problem List).  amb ~ 27' with RW and min assist, anticipate steady progress in acute setting   Pt will benefit from skilled PT to increase their independence and safety with mobility to allow discharge to the venue listed below.      Follow Up Recommendations Follow surgeon's recommendation for DC plan and follow-up therapies    Equipment Recommendations  None recommended by PT    Recommendations for Other Services       Precautions / Restrictions Precautions Precautions: Posterior Hip Restrictions Weight Bearing Restrictions: No      Mobility  Bed Mobility Overal bed mobility: Needs Assistance Bed Mobility: Supine to Sit     Supine to sit: Min guard;Min assist     General bed mobility comments: cues for THP, technique  Transfers Overall transfer level: Needs assistance Equipment used: Rolling walker (2 wheeled) Transfers: Sit to/from Stand Sit to Stand: Min guard;Min assist         General transfer comment: light assist to rise, cues for THP,  hand placement  Ambulation/Gait Ambulation/Gait assistance: Min guard Gait Distance (Feet): 50 Feet Assistive device: Rolling walker (2 wheeled) Gait Pattern/deviations: Step-to pattern;Decreased stance time - left     General Gait Details: cues for sequence and RW position  Stairs            Wheelchair Mobility    Modified Rankin (Stroke Patients Only)       Balance                                             Pertinent Vitals/Pain Pain Assessment: 0-10 Pain Score: 2  Pain Location: L hip Pain Descriptors / Indicators: Grimacing;Sore;Aching Pain Intervention(s): Limited activity within  patient's tolerance;Monitored during session;Premedicated before session;Repositioned;Ice applied    Home Living Family/patient expects to be discharged to:: Private residence Living Arrangements:  (sisters for 2 wks) Available Help at Discharge: Family Type of Home: House Home Access: Level entry     Home Layout: One level Home Equipment: Environmental consultant - 2 wheels;Cane - quad;Bedside commode      Prior Function Level of Independence: Independent               Hand Dominance        Extremity/Trunk Assessment   Upper Extremity Assessment Upper Extremity Assessment: Overall WFL for tasks assessed    Lower Extremity Assessment Lower Extremity Assessment: LLE deficits/detail LLE Deficits / Details: ankle WFL, knee and hip grossly 2+ to 3/5       Communication   Communication: No difficulties  Cognition Arousal/Alertness: Awake/alert Behavior During Therapy: WFL for tasks assessed/performed Overall Cognitive Status: Within Functional Limits for tasks assessed                                        General Comments      Exercises Total Joint Exercises Ankle Circles/Pumps: AROM;10 reps;Both   Assessment/Plan    PT Assessment Patient needs continued PT services  PT Problem List Decreased strength;Decreased range of motion;Decreased  activity tolerance;Decreased mobility;Decreased knowledge of use of DME;Pain;Decreased knowledge of precautions       PT Treatment Interventions DME instruction;Therapeutic exercise;Gait training;Functional mobility training;Therapeutic activities;Patient/family education    PT Goals (Current goals can be found in the Care Plan section)  Acute Rehab PT Goals Patient Stated Goal: return to IND PT Goal Formulation: With patient Time For Goal Achievement: 01/06/20 Potential to Achieve Goals: Good    Frequency 7X/week   Barriers to discharge        Co-evaluation               AM-PAC PT "6 Clicks" Mobility   Outcome Measure Help needed turning from your back to your side while in a flat bed without using bedrails?: A Little Help needed moving from lying on your back to sitting on the side of a flat bed without using bedrails?: A Little Help needed moving to and from a bed to a chair (including a wheelchair)?: A Little Help needed standing up from a chair using your arms (e.g., wheelchair or bedside chair)?: A Little Help needed to walk in hospital room?: A Little Help needed climbing 3-5 steps with a railing? : A Little 6 Click Score: 18    End of Session Equipment Utilized During Treatment: Gait belt Activity Tolerance: Patient tolerated treatment well Patient left: in chair;with call bell/phone within reach;with family/visitor present;with chair alarm set   PT Visit Diagnosis: Difficulty in walking, not elsewhere classified (R26.2)    Time: 7253-6644 PT Time Calculation (min) (ACUTE ONLY): 23 min   Charges:   PT Evaluation $PT Eval Low Complexity: 1 Low PT Treatments $Gait Training: 8-22 mins        Delice Bison, PT  Acute Rehab Dept ([facility]) [phone] Pager [phone]  12/30/2019   [facility] 12/30/2019, 4:53 PM

## 2019-12-30 NOTE — Anesthesia Procedure Notes (Signed)
Procedure Name: MAC Date/Time: 12/30/2019 8:50 AM Performed by: Eben Burow, CRNA Pre-anesthesia Checklist: Patient identified, Emergency Drugs available, Suction available, Patient being monitored and Timeout performed Oxygen Delivery Method: Simple face mask Placement Confirmation: positive ETCO2

## 2019-12-30 NOTE — Progress Notes (Signed)
Notified A Nida PA for B/P 85/60, and hemaglobin of 8.5. Orders received D Susann Givens RN

## 2019-12-30 NOTE — Anesthesia Procedure Notes (Signed)
Spinal  Patient location during procedure: OR Start time: 12/30/2019 8:59 AM Staffing Anesthesiologist: Howze, Kathryn E, MD Resident/CRNA: Good, Melanie B, CRNA Preanesthetic Checklist Completed: patient identified, IV checked, site marked, risks and benefits discussed, surgical consent, monitors and equipment checked, pre-op evaluation and timeout performed Spinal Block Patient position: sitting Prep: DuraPrep and site prepped and draped Patient monitoring: heart rate, cardiac monitor, continuous pulse ox and blood pressure Approach: midline Location: L3-4 Injection technique: single-shot Needle Needle type: Pencan  Needle gauge: 24 G Needle length: 9 cm Assessment Sensory level: T4 Additional Notes Pt placed in sitting position, spinal kit expiration date checked and verified, + CSF, - heme, pt tolerated well. Dr Howze present and supervising throughout SAB.     

## 2019-12-30 NOTE — Discharge Instructions (Signed)

## 2019-12-30 NOTE — Op Note (Signed)
Preop diagnosis: Painful 7.5 mm full depth Postoperative diagnosis: Same  Procedure: Revision right total hip arthroplasty with removal of removal of [place] surface replacement and revision to a 54 mm Gryption sector cup 0 degree +4 polyethylene liner index posterior and superior and a +0 36 mm ceramic head, 16 x 11 x 36 S-ROM stem, 16 F large sleeve  Surgeon: Feliberto Gottron. Turner Daniels M.D.  Assistant: Tomi Likens. Gaylene Brooks  (present throughout entire procedure and necessary for timely completion of the procedure)  Estimated blood loss: 600 cc  Fluid replacement: 1800 cc of crystalloid  Complications: None  Indications: Patient with an [place] total hip that did very well until a few months ago when he had increasing groin pain. The pain wakes him up at night and recently got to the point where he could no longer go walking on a regular basis. MRI scan showed fluid collection, but no bony destruction. Plain x-rays show no change in the position of the components and the stem appears to be well ingrown. Risks and benefits of revision surgery have been discussed and questions answered.  Procedure: Patient was identified by arm band receive preoperative IV antibiotics in the holding area at, and hospital. He was then taken to the operating room where the appropriate anesthetic monitors were attached and general endotracheal anesthesia induced with the patient in the supine position. He was then rolled into the right lateral decubitus position and fixed there with a mark 2 pelvic clamp. A Foley catheter was inserted and the limb prepped and draped in usual sterile fashion from the ankle to the hemipelvis. Time out procedure performed. Skin along the lateral hip and thigh infiltrated with 10 cc of 1/2% Marcaine and epinephrine solution. We began the operation by recreating the old posterior lateral incision 15 cm in line through the skin and subcutaneous tissue down to the level of the IT band which was cut  in line with the skin incision. This exposed the sac of clear fluid. The sac was excised. We then remove scar tissue from around to the  cup and femoral stem trunnion, dislocated a total hip and the [place] hip head component was removed with an oscillating saw getting around the short stem using a small thin blade. This resulted in a standard neck cut 1 cm above the lesser trochanter. And we began loosening the cup by placing a 1/4 inch osteotome between the edge of the acetabular component and the bone. We then used the short Innomed curved osteotome around the edge of the cup and at that point it came out relatively easily indicating no and growth. Very little bone was noticed on the ingrowth surface of the cup and fibrous tissue is then stripped from the acetabulum. We reamed up to a 53 mm basket reamer obtaining good coverage in all quadrants irrigated with normal saline solution. We then hammered into place a 54 mm Gryption sector cup in 45 of abduction and 20 of anteversion. A 0 degree, +4, polyethylene liner index posterior and superior. The hip was flexed and internally rotated exposing the proximal femur which was entered with the S-ROM box cutter followed by axial reaming and half millimeter increments from 8 to 11.5 mm full depth and 12 mm partial depth. We then conically reamed up to a 16 F cone and milled the calcar to a 16 F large calcar. A 16 F large trial sleeve was then placed followed by a 16 x 11 x 36 trial stem in the same  version as the calcar about 30 30 degrees of anteversion. A trial reduction was then performed with a +6 36 mm femoral head instability was noted to `90 of flexion 75 degrees of internal rotation and in full extension the hip could not be dislocated with external rotation. C-arm imaging was used to confirmed good position of the implants and equal leg lengths at this point. At this point a real +6 36 mm ceramic head was hammered into place, the hip reduced and irrigated  with normal saline solution. The capsular flap was repaired back to the intertrochanteric crest through drill holes with #2 Ethibond suture. We then closed the IT band with running #1 Vicryl suture, the subcutaneous tissue with 0 and 2-0 undyed Vicryl suture, the skin was closed with running interlocking 3-0 nylon suture. A dressing of Aquacel was then applied, the patient was unclamped a rolled supine awakened extubated and taken to the recovery without difficulty.

## 2019-12-30 NOTE — Anesthesia Postprocedure Evaluation (Signed)
Anesthesia Post Note  Patient: Angelica Stone  Procedure(s) Performed: LEFT TOTAL HIP REVISION (Left Hip)     Patient location during evaluation: PACU Anesthesia Type: Spinal Level of consciousness: awake and alert and oriented Pain management: pain level controlled Vital Signs Assessment: post-procedure vital signs reviewed and stable Respiratory status: spontaneous breathing, nonlabored ventilation and respiratory function stable Cardiovascular status: blood pressure returned to baseline Postop Assessment: no apparent nausea or vomiting and spinal receding Anesthetic complications: no   No complications documented.  Last Vitals:  Vitals:   12/30/19 1215 12/30/19 1230  BP: 101/73 106/71  Pulse: 75 71  Resp: 15 16  Temp:    SpO2: 100% 100%    Last Pain:  Vitals:   12/30/19 1225  TempSrc:   PainSc: 5                  Kaylyn Layer

## 2019-12-31 ENCOUNTER — Encounter (HOSPITAL_COMMUNITY): Payer: Self-pay | Admitting: Orthopedic Surgery

## 2019-12-31 DIAGNOSIS — T8484XA Pain due to internal orthopedic prosthetic devices, implants and grafts, initial encounter: Secondary | ICD-10-CM | POA: Diagnosis not present

## 2019-12-31 LAB — CBC
HCT: 23.8 % — ABNORMAL LOW (ref 36.0–46.0)
Hemoglobin: 7.6 g/dL — ABNORMAL LOW (ref 12.0–15.0)
MCH: 34.4 pg — ABNORMAL HIGH (ref 26.0–34.0)
MCHC: 31.9 g/dL (ref 30.0–36.0)
MCV: 107.7 fL — ABNORMAL HIGH (ref 80.0–100.0)
Platelets: 123 10*3/uL — ABNORMAL LOW (ref 150–400)
RBC: 2.21 MIL/uL — ABNORMAL LOW (ref 3.87–5.11)
RDW: 12.6 % (ref 11.5–15.5)
WBC: 7.6 10*3/uL (ref 4.0–10.5)
nRBC: 0 % (ref 0.0–0.2)

## 2019-12-31 LAB — BASIC METABOLIC PANEL
Anion gap: 5 (ref 5–15)
BUN: 7 mg/dL — ABNORMAL LOW (ref 8–23)
CO2: 21 mmol/L — ABNORMAL LOW (ref 22–32)
Calcium: 7.9 mg/dL — ABNORMAL LOW (ref 8.9–10.3)
Chloride: 115 mmol/L — ABNORMAL HIGH (ref 98–111)
Creatinine, Ser: 0.59 mg/dL (ref 0.44–1.00)
GFR calc Af Amer: >60 mL/min (ref >60)
GFR calc non Af Amer: >60 mL/min (ref >60)
Glucose, Bld: 143 mg/dL — ABNORMAL HIGH (ref 70–99)
Potassium: 4 mmol/L (ref 3.5–5.1)
Sodium: 141 mmol/L (ref 135–145)

## 2019-12-31 LAB — PREPARE RBC (CROSSMATCH)

## 2019-12-31 MED ORDER — SODIUM CHLORIDE 0.9% IV SOLUTION: Freq: Once | INTRAVENOUS | Status: DC

## 2019-12-31 MED ORDER — SODIUM CHLORIDE 0.9 % IV BOLUS
500.0000 mL | Freq: Once | INTRAVENOUS | Status: AC
  Administered 2019-12-31: 500 mL via INTRAVENOUS

## 2019-12-31 NOTE — Plan of Care (Signed)
Plan of care reviewed and discussed with the patient. 

## 2019-12-31 NOTE — Plan of Care (Signed)
  Problem: Activity: Goal: Ability to avoid complications of mobility impairment will improve 12/31/2019 2205 by Sherren Kerns, RN Outcome: Progressing 12/31/2019 2205 by Sherren Kerns, RN Outcome: Progressing   Problem: Activity: Goal: Ability to tolerate increased activity will improve 12/31/2019 2205 by Sherren Kerns, RN Outcome: Progressing 12/31/2019 2205 by Sherren Kerns, RN Outcome: Progressing   Problem: Pain Management: Goal: Pain level will decrease with appropriate interventions 12/31/2019 2205 by Sherren Kerns, RN Outcome: Progressing 12/31/2019 2205 by Sherren Kerns, RN Outcome: Progressing   Problem: Education: Goal: Knowledge of General Education information will improve Description: Including pain rating scale, medication(s)/side effects and non-pharmacologic comfort measures 12/31/2019 2205 by Sherren Kerns, RN Outcome: Progressing 12/31/2019 2205 by Sherren Kerns, RN Outcome: Progressing

## 2019-12-31 NOTE — TOC Transition Note (Signed)
Transition of Care [facility]) - CM/SW Discharge Note   Patient Details  Name: Angelica Stone MRN: [id number] Date of Birth: [birth year]  Transition of Care [facility]) CM/SW Contact:  Clearance Coots, LCSW Phone Number: 12/31/2019, 9:53 AM   Clinical Narrative:    Therapy Plan: HHPT [facility]  Patient has DME   Final next level of care: Home w Home Health Services Barriers to Discharge: No Barriers Identified   Patient Goals and CMS Choice   CMS Medicare.gov Compare Post Acute Care list provided to:: Patient (Pre-arranged in Ortho office.) Choice offered to / list presented to : Patient  Discharge Placement                       Discharge Plan and Services                DME Arranged: N/A DME Agency: NA       HH Arranged: PT HH Agency: [facility] (formerly [facility]) Date HH Agency Contacted: 12/31/19 Time HH Agency Contacted: [phone] Representative spoke with at [facility] Agency: Kathlene November  Social Determinants of Health (SDOH) Interventions     Readmission Risk Interventions No flowsheet data found.

## 2019-12-31 NOTE — Progress Notes (Signed)
PATIENT ID: Angelica Stone  MRN: [id number]  DOB/AGE:  62-Oct-1957 / 62 y.o.  1 Day Post-Op Procedure(s) (LRB): LEFT TOTAL HIP REVISION (Left)    PROGRESS NOTE Subjective: Patient is alert, oriented, no Nausea, no Vomiting, yes passing gas, . Taking PO well. Denies SOB, Chest or Calf Pain. Using Incentive Spirometer, PAS in place. Ambulate 50 Patient reports pain as  3/10  .    Objective: Vital signs in last 24 hours: Vitals:   12/30/19 1757 12/30/19 2157 12/31/19 0157 12/31/19 0619  BP: (Abnormal) 85/60 (Abnormal) 85/55 (Abnormal) 96/49 (Abnormal) 82/55  Pulse: 73 73 78 79  Resp:  16 17 20   Temp:  98.6 F (37 C) 98.2 F (36.8 C) 97.8 F (36.6 C)  TempSrc:  Oral    SpO2: 100% 100% 100% 100%  Weight:      Height:          Intake/Output from previous day: I/O last 3 completed shifts: In: 5935.6 [P.O.:960; I.V.:3634; IV Piggyback:1341.7] Out: 4700 [Urine:4000; Blood:700]   Intake/Output this shift: No intake/output data recorded.   LABORATORY DATA: Recent Labs    12/30/19 1734 12/31/19 0312  WBC  --  7.6  HGB 8.5* 7.6*  HCT 26.0* 23.8*  PLT  --  123*  NA  --  141  K  --  4.0  CL  --  115*  CO2  --  21*  BUN  --  7*  CREATININE  --  0.59  GLUCOSE  --  143*  CALCIUM  --  7.9*    Examination: Neurologically intact ABD soft Neurovascular intact Sensation intact distally Intact pulses distally Dorsiflexion/Plantar flexion intact Incision: dressing C/D/I No cellulitis present Compartment soft} XR AP&Lat of hip shows well placed\fixed THA  Assessment:   1 Day Post-Op Procedure(s) (LRB): LEFT TOTAL HIP REVISION (Left) ADDITIONAL DIAGNOSIS:  Expected Acute Blood Loss Anemia, Anxiety  Patient's anticipated LOS is less than 2 midnights, meeting these requirements: - Younger than 75 - Lives within 1 hour of care - Has a competent adult at home to recover with post-op recover - NO history of  - Chronic pain requiring opiods  - Diabetes  - Coronary Artery  Disease  - Heart failure  - Heart attack  - Stroke  - DVT/VTE  - Cardiac arrhythmia  - Respiratory Failure/COPD  - Renal failure  - Anemia  - Advanced Liver disease       Plan: PT/OT WBAT, Post THA precautions  DVT Prophylaxis: SCDx72 hrs, ASA 81 mg BID x 2 weeks  DISCHARGE PLAN: Home, if passes PT today  DISCHARGE NEEDS: HHPT, Walker and 3-in-1 comode seatPatient ID: [id number], female   DOB: 1955-11-14, 64 y.o.   MRN: [id number]

## 2019-12-31 NOTE — Discharge Summary (Signed)
Patient ID: Angelica Stone MRN: [id number] DOB/AGE: [birth year] 62 y.o.  Admit date: 12/30/2019 Discharge date: 12/31/2019  Admission Diagnoses:  Principal Problem:   Failed total hip arthroplasty [facility]) Active Problems:   History of revision of total hip arthroplasty   Discharge Diagnoses:  Same  Past Medical History:  Diagnosis Date  . Anxiety   . Arthritis   . Collagenous colitis   . Depression   . Gallstone   . History of kidney stones   . Hypothyroidism   . Migraines   . MVP (mitral valve prolapse)    early 20's  . Pneumonia   . Swelling of both ankles   . Varicose veins of left leg with edema     Surgeries: Procedure(s): LEFT TOTAL HIP REVISION on 12/30/2019   Consultants:   Discharged Condition: Improved  Hospital Course: Angelica Stone is an 64 y.o. female who was admitted 12/30/2019 for operative treatment ofFailed total hip arthroplasty (HCC). Patient has severe unremitting pain that affects sleep, daily activities, and work/hobbies. After pre-op clearance the patient was taken to the operating room on 12/30/2019 and underwent  Procedure(s): LEFT TOTAL HIP REVISION.    Patient was given perioperative antibiotics:  Anti-infectives (From admission, onward)   Start     Dose/Rate Route Frequency Ordered Stop   12/30/19 0730  ceFAZolin (ANCEF) IVPB 2g/100 mL premix        2 g 200 mL/hr over 30 Minutes Intravenous On call to O.R. 12/30/19 0715 12/30/19 0900       Patient was given sequential compression devices, early ambulation, and chemoprophylaxis to prevent DVT.  Patient benefited maximally from hospital stay and there were no complications.    Recent vital signs:  Patient Vitals for the past 24 hrs:  BP Temp Temp src Pulse Resp SpO2 Height Weight  12/31/19 0619 (Abnormal) 82/55 97.8 F (36.6 C) no documentation 79 20 100 % no documentation no documentation  12/31/19 0157 (Abnormal) 96/49 98.2 F (36.8 C) no documentation 78 17 100 % no documentation no  documentation  12/30/19 2157 (Abnormal) 85/55 98.6 F (37 C) Oral 73 16 100 % no documentation no documentation  12/30/19 1757 (Abnormal) 85/60 no documentation no documentation 73 no documentation 100 % no documentation no documentation  12/30/19 1649 (Abnormal) 82/58 97.8 F (36.6 C) no documentation 75 16 100 % no documentation no documentation  12/30/19 1549 (Abnormal) 87/60 98.1 F (36.7 C) no documentation 76 14 100 % no documentation no documentation  12/30/19 1435 124/80 98 F (36.7 C) Oral 74 16 100 % no documentation no documentation  12/30/19 1335 (Abnormal) 123/97 98.3 F (36.8 C) Oral 70 18 100 % 5\' 9"  (1.753 m) 54.4 kg  12/30/19 1315 100/65 no documentation no documentation 74 14 100 % no documentation no documentation  12/30/19 1300 (Abnormal) 97/61 no documentation no documentation 73 15 100 % no documentation no documentation  12/30/19 1245 100/70 98.1 F (36.7 C) no documentation 71 15 100 % no documentation no documentation  12/30/19 1230 106/71 no documentation no documentation 71 16 100 % no documentation no documentation  12/30/19 1215 101/73 no documentation no documentation 75 15 100 % no documentation no documentation  12/30/19 1200 97/66 no documentation no documentation 72 15 100 % no documentation no documentation  12/30/19 1154 95/75 97.6 F (36.4 C) no documentation 78 15 100 % no documentation no documentation     Recent laboratory studies:  Recent Labs    12/30/19 1734 12/31/19 0312  WBC  --  7.6  HGB 8.5* 7.6*  HCT 26.0* 23.8*  PLT  --  123*  NA  --  141  K  --  4.0  CL  --  115*  CO2  --  21*  BUN  --  7*  CREATININE  --  0.59  GLUCOSE  --  143*  CALCIUM  --  7.9*     Discharge Medications:   Allergies as of 12/31/2019    Allergen Reactions Comment   Sulfa Antibiotics Swelling, Rash Swelling around mouth and throat      Medication List    Stop taking these medications   multivitamin with minerals Tabs tablet   ondansetron 4 MG  disintegrating tablet Commonly known as: ZOFRAN-ODT   potassium chloride 10 MEQ tablet Commonly known as: KLOR-CON   ZINC PO     Take these medications   aspirin EC 81 MG tablet Take 1 tablet (81 mg total) by mouth 2 (two) times daily.   budesonide 3 MG 24 hr capsule Commonly known as: ENTOCORT EC Take 2 capsules alternating with 1 capsule x 1 month   calcium carbonate 1500 (600 Ca) MG Tabs tablet Commonly known as: OSCAL Take 600 mg of elemental calcium by mouth daily.   citalopram 40 MG tablet Commonly known as: CELEXA Take 40 mg by mouth daily.   cyanocobalamin 2000 MCG tablet Take 2,000 mcg by mouth daily.   levothyroxine 112 MCG tablet Commonly known as: SYNTHROID Take 112 mcg by mouth daily before breakfast.   multivitamin tablet Take 1 tablet by mouth daily.   oxyCODONE-acetaminophen 5-325 MG tablet Commonly known as: PERCOCET/ROXICET Take 1 tablet by mouth every 4 (four) hours as needed for severe pain.   tiZANidine 2 MG tablet Commonly known as: ZANAFLEX Take 1 tablet (2 mg total) by mouth every 6 (six) hours as needed.   topiramate 50 MG tablet Commonly known as: TOPAMAX Take 50 mg by mouth 2 (two) times daily.   Vitamin D3 125 MCG (5000 UT) Caps Take 5,000 Units by mouth daily.   ZOLMitriptan 2.5 MG tablet Commonly known as: ZOMIG Take 2.5 mg by mouth as needed for migraine.        Durable Medical Equipment  (From admission, onward)         Start     Ordered   12/30/19 1341  DME Walker rolling  Once       Question:  Patient needs a walker to treat with the following condition  Answer:  Status post total hip replacement, left   12/30/19 1341   12/30/19 1341  DME 3 n 1  Once        12/30/19 1341           Discharge Care Instructions  (From admission, onward)         Start     Ordered   12/31/19 0000  Change dressing       Comments: Change dressing Only if drainage exceeds 40% of window on dressing   12/31/19 0805           Diagnostic Studies: DG Chest 2 View  Result Date: 12/27/2019 CLINICAL DATA:  Preop. EXAM: CHEST - 2 VIEW COMPARISON:  None. FINDINGS: Lungs are adequately inflated and otherwise clear. Cardiomediastinal silhouette, bones and soft tissues are unremarkable. IMPRESSION: No active cardiopulmonary disease. Electronically Signed   By: Elberta Fortis M.D.   On: 12/27/2019 12:56   DG C-Arm 1-60 Min-No Report  Result Date: 12/30/2019 Fluoroscopy was utilized by the requesting  physician.  No radiographic interpretation.   DG Hip Port Unilat With Pelvis 1V Left  Result Date: 12/30/2019 CLINICAL DATA:  Revision of left hip arthroplasty. EXAM: OPERATIVE left HIP (WITH PELVIS IF PERFORMED) 2 VIEWS TECHNIQUE: Fluoroscopic spot image(s) were submitted for interpretation post-operatively. Radiation exposure index: 0.3679 mGy. COMPARISON:  Oct 25, 2019. FINDINGS: Two intraoperative fluoroscopic images were obtained of the left hip. These images demonstrate the left acetabular and femoral components to be well situated. IMPRESSION: Fluoroscopic guidance provided during left hip arthroplasty. Electronically Signed   By: Lupita Raider M.D.   On: 12/30/2019 12:49   DG HIP OPERATIVE UNILAT W OR W/O PELVIS LEFT  Result Date: 12/30/2019 CLINICAL DATA:  Revision of left hip arthroplasty. EXAM: OPERATIVE left HIP (WITH PELVIS IF PERFORMED) 2 VIEWS TECHNIQUE: Fluoroscopic spot image(s) were submitted for interpretation post-operatively. Radiation exposure index: 0.3679 mGy. COMPARISON:  Oct 25, 2019. FINDINGS: Two intraoperative fluoroscopic images were obtained of the left hip. These images demonstrate the left acetabular and femoral components to be well situated. IMPRESSION: Fluoroscopic guidance provided during left hip arthroplasty. Electronically Signed   By: Lupita Raider M.D.   On: 12/30/2019 12:49    Disposition: Discharge disposition: 01-Home or Self Care       Discharge Instructions    Call MD /  Call 911   Complete by: As directed    If you experience chest pain or shortness of breath, CALL 911 and be transported to the hospital emergency room.  If you develope a fever above 101 F, pus (white drainage) or increased drainage or redness at the wound, or calf pain, call your surgeon's office.   Change dressing   Complete by: As directed    Change dressing Only if drainage exceeds 40% of window on dressing   Constipation Prevention   Complete by: As directed    Drink plenty of fluids.  Prune juice may be helpful.  You may use a stool softener, such as Colace (over the counter) 100 mg twice a day.  Use MiraLax (over the counter) for constipation as needed.   Diet - low sodium heart healthy   Complete by: As directed    Increase activity slowly as tolerated   Complete by: As directed        Follow-up Information    Gean Birchwood, MD In 2 weeks.   Specialty: Orthopedic Surgery Contact information: [address] [place] [address] [phone]                Signed: Nestor Lewandowsky 12/31/2019, 8:05 AM

## 2019-12-31 NOTE — Progress Notes (Signed)
Physical Therapy Treatment Patient Details Name: Angelica Stone MRN: [id number] DOB: 1956/05/11 Today's Date: 12/31/2019    History of Present Illness s/p L posterior THA revision; PMH: anxiety, L THA, migraines    PT Comments    Pt progressing, some dizziness with short distance amb. Tolerated THA HEP without difficulty. Pt reports her sister is encouraging her to get a unit of blood prior to d/c. Will see again this pm and see how her  amb distance/activity tolerance is improving. Pt is doing very well overall, aware of THP with regard to mobility.   Follow Up Recommendations  Follow surgeon's recommendation for DC plan and follow-up therapies     Equipment Recommendations  None recommended by PT    Recommendations for Other Services       Precautions / Restrictions Precautions Precautions: Posterior Hip Precaution Comments: pt verbalizes 3/3 THP Restrictions Weight Bearing Restrictions: No    Mobility  Bed Mobility Overal bed mobility: Needs Assistance Bed Mobility: Supine to Sit     Supine to sit: Min guard;Supervision     General bed mobility comments: cues for THP, technique  Transfers Overall transfer level: Needs assistance Equipment used: Rolling walker (2 wheeled) Transfers: Sit to/from Stand Sit to Stand: Min guard;Supervision         General transfer comment: light assist to rise, cues for THP,  hand placement  Ambulation/Gait Ambulation/Gait assistance: Min guard Gait Distance (Feet): 20 Feet (x2) Assistive device: Rolling walker (2 wheeled) Gait Pattern/deviations: Step-to pattern;Decreased stance time - left     General Gait Details: cues for sequence and RW position   Stairs             Wheelchair Mobility    Modified Rankin (Stroke Patients Only)       Balance                                            Cognition Arousal/Alertness: Awake/alert Behavior During Therapy: WFL for tasks  assessed/performed Overall Cognitive Status: Within Functional Limits for tasks assessed                                        Exercises Total Joint Exercises Ankle Circles/Pumps: AROM;10 reps;Both Quad Sets: AROM;Both;10 reps Heel Slides: AROM;10 reps;Left;AAROM Hip ABduction/ADduction: AROM;Left;10 reps    General Comments        Pertinent Vitals/Pain Pain Assessment: 0-10 Pain Score: 4  Pain Location: L hip Pain Descriptors / Indicators: Grimacing;Sore;Aching Pain Intervention(s): Limited activity within patient's tolerance;Monitored during session;Premedicated before session;Repositioned    Home Living                      Prior Function            PT Goals (current goals can now be found in the care plan section) Acute Rehab PT Goals Patient Stated Goal: return to IND PT Goal Formulation: With patient Time For Goal Achievement: 01/06/20 Potential to Achieve Goals: Good Progress towards PT goals: Progressing toward goals    Frequency    7X/week      PT Plan Current plan remains appropriate    Co-evaluation              AM-PAC PT "6 Clicks" Mobility   Outcome Measure  Help  needed turning from your back to your side while in a flat bed without using bedrails?: A Little Help needed moving from lying on your back to sitting on the side of a flat bed without using bedrails?: A Little Help needed moving to and from a bed to a chair (including a wheelchair)?: A Little Help needed standing up from a chair using your arms (e.g., wheelchair or bedside chair)?: A Little Help needed to walk in hospital room?: A Little Help needed climbing 3-5 steps with a railing? : A Little 6 Click Score: 18    End of Session Equipment Utilized During Treatment: Gait belt Activity Tolerance: Patient tolerated treatment well;Treatment limited secondary to medical complications (Comment) (dizziness) Patient left: in chair;with call bell/phone  within reach;with family/visitor present   PT Visit Diagnosis: Difficulty in walking, not elsewhere classified (R26.2)     Time: 2197-5883 PT Time Calculation (min) (ACUTE ONLY): 26 min  Charges:  $Gait Training: 8-22 mins $Therapeutic Exercise: 8-22 mins                     Delice Bison, PT  Acute Rehab Dept ([facility]) [phone] Pager [phone]  12/31/2019    [facility] 12/31/2019, 10:58 AM

## 2019-12-31 NOTE — Progress Notes (Signed)
Physical Therapy Treatment Patient Details Name: Angelica Stone MRN: [id number] DOB: 17-Aug-1955 Today's Date: 12/31/2019    History of Present Illness s/p L posterior THA revision; PMH: anxiety, L THA, migraines    PT Comments    Pt progressing well this pm. incr amb distance and tolerated well, mild dizziness during gait (BP and Hgb low). Pt to get unit of blood and possibly d/c later today.    Follow Up Recommendations  Follow surgeon's recommendation for DC plan and follow-up therapies     Equipment Recommendations  None recommended by PT    Recommendations for Other Services       Precautions / Restrictions Precautions Precautions: Posterior Hip Precaution Comments: pt verbalizes 3/3 THP Restrictions Weight Bearing Restrictions: No    Mobility  Bed Mobility Overal bed mobility: Needs Assistance Bed Mobility: Supine to Sit;Sit to Supine     Supine to sit: Min guard;Supervision Sit to supine: Min guard;Supervision   General bed mobility comments: cues for THP, technique  Transfers Overall transfer level: Needs assistance Equipment used: Rolling walker (2 wheeled) Transfers: Sit to/from Stand Sit to Stand: Supervision         General transfer comment: for safety   Ambulation/Gait Ambulation/Gait assistance: Min guard Gait Distance (Feet): 120 Feet Assistive device: Rolling walker (2 wheeled) Gait Pattern/deviations: Step-to pattern;Decreased stance time - left     General Gait Details: cues for sequence and RW position; mild dizziness--did not worsen during distance    Stairs             Wheelchair Mobility    Modified Rankin (Stroke Patients Only)       Balance                                            Cognition Arousal/Alertness: Awake/alert Behavior During Therapy: WFL for tasks assessed/performed Overall Cognitive Status: Within Functional Limits for tasks assessed                                         Exercises      General Comments        Pertinent Vitals/Pain Pain Assessment: 0-10 Pain Score: 4  Pain Location: L hip Pain Descriptors / Indicators: Grimacing;Sore;Aching Pain Intervention(s): Limited activity within patient's tolerance;Monitored during session;Repositioned    Home Living                      Prior Function            PT Goals (current goals can now be found in the care plan section) Acute Rehab PT Goals Patient Stated Goal: return to IND PT Goal Formulation: With patient Time For Goal Achievement: 01/06/20 Potential to Achieve Goals: Good Progress towards PT goals: Progressing toward goals    Frequency    7X/week      PT Plan Current plan remains appropriate    Co-evaluation              AM-PAC PT "6 Clicks" Mobility   Outcome Measure  Help needed turning from your back to your side while in a flat bed without using bedrails?: A Little Help needed moving from lying on your back to sitting on the side of a flat bed without using bedrails?: A Little Help needed  moving to and from a bed to a chair (including a wheelchair)?: A Little Help needed standing up from a chair using your arms (e.g., wheelchair or bedside chair)?: A Little Help needed to walk in hospital room?: A Little Help needed climbing 3-5 steps with a railing? : A Little 6 Click Score: 18    End of Session Equipment Utilized During Treatment: Gait belt Activity Tolerance: Patient tolerated treatment well Patient left: with call bell/phone within reach;with family/visitor present;in bed   PT Visit Diagnosis: Difficulty in walking, not elsewhere classified (R26.2)     Time: 5465-6812 PT Time Calculation (min) (ACUTE ONLY): 34 min  Charges:  $Gait Training: 23-37 mins                     Delice Bison, PT  Acute Rehab Dept ([facility]) [phone] Pager [phone]  12/31/2019    [facility] 12/31/2019, 3:04 PM

## 2020-01-01 DIAGNOSIS — T8484XA Pain due to internal orthopedic prosthetic devices, implants and grafts, initial encounter: Secondary | ICD-10-CM | POA: Diagnosis not present

## 2020-01-01 LAB — TYPE AND SCREEN
ABO/RH(D): O POS
Antibody Screen: NEGATIVE
Unit division: 0

## 2020-01-01 LAB — CBC
HCT: 27.6 % — ABNORMAL LOW (ref 36.0–46.0)
Hemoglobin: 8.9 g/dL — ABNORMAL LOW (ref 12.0–15.0)
MCH: 32.7 pg (ref 26.0–34.0)
MCHC: 32.2 g/dL (ref 30.0–36.0)
MCV: 101.5 fL — ABNORMAL HIGH (ref 80.0–100.0)
Platelets: 131 10*3/uL — ABNORMAL LOW (ref 150–400)
RBC: 2.72 MIL/uL — ABNORMAL LOW (ref 3.87–5.11)
RDW: 16.2 % — ABNORMAL HIGH (ref 11.5–15.5)
WBC: 8.2 10*3/uL (ref 4.0–10.5)
nRBC: 0 % (ref 0.0–0.2)

## 2020-01-01 LAB — BPAM RBC
Blood Product Expiration Date: 202108272359
ISSUE DATE / TIME: 202107271519
Unit Type and Rh: 5100

## 2020-01-01 MED ORDER — GABAPENTIN 100 MG PO CAPS
100.0000 mg | ORAL_CAPSULE | Freq: Every day | ORAL | 2 refills | Status: DC
Start: 2020-01-01 — End: 2022-05-18

## 2020-01-01 NOTE — Progress Notes (Signed)
PATIENT ID: Angelica Stone  MRN: [id number]  DOB/AGE:  Jan 05, 1956 / 64 y.o.  2 Days Post-Op Procedure(s) (LRB): LEFT TOTAL HIP REVISION (Left)    PROGRESS NOTE Subjective: Patient is alert, oriented, no Nausea, no Vomiting, yes passing gas, . Taking PO well. Denies SOB, Chest or Calf Pain. Using Incentive Spirometer, PAS in place. Ambulate WBAT with pt walking 120 ft yesterday Patient reports pain as  Mild to moderate  .    Objective: Vital signs in last 24 hours: Vitals:   12/31/19 1550 12/31/19 1755 12/31/19 2211 01/01/20 0600  BP: (!) 87/61 99/70 109/67 102/68  Pulse: 77 70 72 72  Resp:  18 16 16   Temp: 98.4 F (36.9 C) 98.1 F (36.7 C) 98.8 F (37.1 C) 98.1 F (36.7 C)  TempSrc: Oral Oral    SpO2: 99% 99% 99% 100%  Weight:      Height:          Intake/Output from previous day: I/O last 3 completed shifts: In: 5861.3 [P.O.:1440; I.V.:3214.6; Blood:315; IV Piggyback:891.7] Out: 5350 [Urine:5350]   Intake/Output this shift: No intake/output data recorded.   LABORATORY DATA: Recent Labs    12/31/19 0312 01/01/20 0258  WBC 7.6 8.2  HGB 7.6* 8.9*  HCT 23.8* 27.6*  PLT 123* 131*  NA 141  --   K 4.0  --   CL 115*  --   CO2 21*  --   BUN 7*  --   CREATININE 0.59  --   GLUCOSE 143*  --   CALCIUM 7.9*  --     Examination: Neurologically intact Neurovascular intact Sensation intact distally Intact pulses distally Dorsiflexion/Plantar flexion intact Incision: dressing C/D/I and no drainage No cellulitis present Compartment soft} XR AP&Lat of hip shows well placed\fixed THA  Assessment:   2 Days Post-Op Procedure(s) (LRB): LEFT TOTAL HIP REVISION (Left) ADDITIONAL DIAGNOSIS:  Expected Acute Blood Loss Anemia, Acute Blood Loss Anemia and anxiety Anticipated LOS equal to or greater than 2 midnights due to - Age 60 and older with one or more of the following:  - Obesity  - Expected need for hospital services (PT, OT, Nursing) required for safe  discharge  -  Anticipated need for postoperative skilled nursing care or inpatient rehab  - Active co-morbidities: Anemia   Plan: PT/OT WBAT, posterior THA precautions  DVT Prophylaxis: SCDx72 hrs, ASA 81 mg BID x 2 weeks  DISCHARGE PLAN: Home  DISCHARGE NEEDS: HHPT, Walker and 3-in-1 comode seat

## 2020-01-01 NOTE — Progress Notes (Signed)
Physical Therapy Treatment Patient Details Name: Angelica Stone MRN: [id number] DOB: 1955/09/15 Today's Date: 01/01/2020    History of Present Illness s/p L posterior THA revision; PMH: anxiety, L THA, migraines    PT Comments    POD # 2 am session Sister present during session and very helpful.  General bed mobility comments: demonstarted and instructed how to use belt to self assist LE on/off bed however pt self able present with a strong core/ABD muscles General transfer comment: 50% VC's to avoid hip flex >90 degrees with sit to stand and stand to sit.  50% VC's to extend LE and to "keep nose in the air".  Sister present also during session for education THP. General Gait Details: 25% VC's on safety with turns to avoid hip IR and proper walker to self distance with upright posture. Then returned to room to perform some TE's following HEP handout.  Instructed on proper tech, freq as well as use of ICE.   Reviewed and educated pt and sister on THP.  Addressed all mobility questions, discussed appropriate activity, educated on use of ICE.  Pt ready for D/C to home.   Follow Up Recommendations  Follow surgeons recommendation for DC plan and follow-up therapies;Home health PT     Equipment Recommendations  None recommended by PT    Recommendations for Other Services       Precautions / Restrictions Precautions Precautions: Posterior Hip Precaution Comments: pt verbalizes 3/3 THP but required 25% instruction during functional activity during sit to stand and stand to sit as well as reaching forward/downward Restrictions Weight Bearing Restrictions: No Other Position/Activity Restrictions: WBAT    Mobility  Bed Mobility Overal bed mobility: Needs Assistance Bed Mobility: Supine to Sit;Sit to Supine     Supine to sit: Supervision Sit to supine: Supervision   General bed mobility comments: demonstarted and instructed how to use belt to self assist LE on/off bed however pt self  able present with a strong core/ABD muscles  Transfers Overall transfer level: Needs assistance Equipment used: Rolling walker (2 wheeled) Transfers: Sit to/from [facility] Sit to Stand: Supervision Stand pivot transfers: Supervision       General transfer comment: 50% VC's to avoid hip flex >90 degrees with sit to stand and stand to sit.  50% VC's to extend LE and to "keep nose in the air".  Sister present also during session for education THP.  Ambulation/Gait Ambulation/Gait assistance: Supervision Gait Distance (Feet): 35 Feet Assistive device: Rolling walker (2 wheeled) Gait Pattern/deviations: Step-to pattern;Decreased stance time - left Gait velocity: WNL   General Gait Details: 25% VC's on safety with turns to avoid hip IR and proper walker to self distance with upright posture.   Stairs Stairs:  (no stairs to enter home)           Wheelchair Mobility    Modified Rankin (Stroke Patients Only)       Balance                                            Cognition Arousal/Alertness: Awake/alert Behavior During Therapy: WFL for tasks assessed/performed Overall Cognitive Status: Within Functional Limits for tasks assessed                                 General Comments: AxO x  3 pleasant      Exercises   Total Hip Replacement TE's following HEP Handout 10 reps ankle pumps 05 reps knee presses 05 reps heel slides 05 reps SAQ's 05 reps ABD 05 reps all standing TE's   Followed by ICE     General Comments        Pertinent Vitals/Pain Pain Assessment: 0-10 Pain Score: 4  Pain Location: L hip Pain Descriptors / Indicators: Grimacing;Sore;Aching;Tender Pain Intervention(s): Monitored during session;Premedicated before session;Repositioned;Ice applied    Home Living                      Prior Function            PT Goals (current goals can now be found in the care plan section) Progress  towards PT goals: Progressing toward goals    Frequency    7X/week      PT Plan Current plan remains appropriate    Co-evaluation              AM-PAC PT "6 Clicks" Mobility   Outcome Measure  Help needed turning from your back to your side while in a flat bed without using bedrails?: None Help needed moving from lying on your back to sitting on the side of a flat bed without using bedrails?: None Help needed moving to and from a bed to a chair (including a wheelchair)?: None Help needed standing up from a chair using your arms (e.g., wheelchair or bedside chair)?: A Little Help needed to walk in hospital room?: A Little Help needed climbing 3-5 steps with a railing? : A Little 6 Click Score: 21    End of Session Equipment Utilized During Treatment: Gait belt Activity Tolerance: Patient tolerated treatment well Patient left: with call bell/phone within reach;with family/visitor present;in bed Nurse Communication: Mobility status (pt has met goals to D/C to home after one PT session) PT Visit Diagnosis: Difficulty in walking, not elsewhere classified (R26.2)     Time: 1135-1200 PT Time Calculation (min) (ACUTE ONLY): 25 min  Charges:  $Gait Training: 8-22 mins $Therapeutic Exercise: 8-22 mins                     Rica Koyanagi  PTA Acute  Rehabilitation Services Pager      [phone] Office      [phone]

## 2020-01-01 NOTE — Discharge Summary (Signed)
Patient ID: Angelica Stone MRN: [id number] DOB/AGE: [birth year] 62 y.o.  Admit date: 12/30/2019 Discharge date: 01/01/2020  Admission Diagnoses:  Principal Problem:   Failed total hip arthroplasty [facility]) Active Problems:   History of revision of total hip arthroplasty   Discharge Diagnoses:  Same  Past Medical History:  Diagnosis Date   Anxiety    Arthritis    Collagenous colitis    Depression    Gallstone    History of kidney stones    Hypothyroidism    Migraines    MVP (mitral valve prolapse)    early 20's   Pneumonia    Swelling of both ankles    Varicose veins of left leg with edema     Surgeries: Procedure(s): LEFT TOTAL HIP REVISION on 12/30/2019   Consultants:   Discharged Condition: Improved  Hospital Course: Angelica Stone is an 64 y.o. female who was admitted 12/30/2019 for operative treatment ofFailed total hip arthroplasty (HCC). Patient has severe unremitting pain that affects sleep, daily activities, and work/hobbies. After pre-op clearance the patient was taken to the operating room on 12/30/2019 and underwent  Procedure(s): LEFT TOTAL HIP REVISION.    Patient was given perioperative antibiotics:  Anti-infectives (From admission, onward)   Start     Dose/Rate Route Frequency Ordered Stop   12/30/19 0730  ceFAZolin (ANCEF) IVPB 2g/100 mL premix        2 g 200 mL/hr over 30 Minutes Intravenous On call to O.R. 12/30/19 0715 12/30/19 0900       Patient was given sequential compression devices, early ambulation, and chemoprophylaxis to prevent DVT.  Patient benefited maximally from hospital stay and there were no complications.    Recent vital signs:  Patient Vitals for the past 24 hrs:  BP Temp Temp src Pulse Resp SpO2  01/01/20 0600 102/68 98.1 F (36.7 C) -- 72 16 100 %  12/31/19 2211 109/67 98.8 F (37.1 C) -- 72 16 99 %  12/31/19 1755 99/70 98.1 F (36.7 C) Oral 70 18 99 %  12/31/19 1550 (!) 87/61 98.4 F (36.9 C) Oral 77 -- 99 %   12/31/19 1512 (!) 90/59 98.6 F (37 C) Oral 78 18 98 %  12/31/19 1335 (!) 84/58 98.8 F (37.1 C) Oral 77 17 100 %     Recent laboratory studies:  Recent Labs    12/31/19 0312 01/01/20 0258  WBC 7.6 8.2  HGB 7.6* 8.9*  HCT 23.8* 27.6*  PLT 123* 131*  NA 141  --   K 4.0  --   CL 115*  --   CO2 21*  --   BUN 7*  --   CREATININE 0.59  --   GLUCOSE 143*  --   CALCIUM 7.9*  --      Discharge Medications:   Allergies as of 01/01/2020      Reactions   Sulfa Antibiotics Swelling, Rash   Swelling around mouth and throat      Medication List    STOP taking these medications   multivitamin with minerals Tabs tablet   ondansetron 4 MG disintegrating tablet Commonly known as: ZOFRAN-ODT   potassium chloride 10 MEQ tablet Commonly known as: KLOR-CON   ZINC PO     TAKE these medications   aspirin EC 81 MG tablet Take 1 tablet (81 mg total) by mouth 2 (two) times daily.   budesonide 3 MG 24 hr capsule Commonly known as: ENTOCORT EC Take 2 capsules alternating with 1 capsule x 1 month  calcium carbonate 1500 (600 Ca) MG Tabs tablet Commonly known as: OSCAL Take 600 mg of elemental calcium by mouth daily.   citalopram 40 MG tablet Commonly known as: CELEXA Take 40 mg by mouth daily.   cyanocobalamin 2000 MCG tablet Take 2,000 mcg by mouth daily.   gabapentin 100 MG capsule Commonly known as: Neurontin Take 1 capsule (100 mg total) by mouth at bedtime.   levothyroxine 112 MCG tablet Commonly known as: SYNTHROID Take 112 mcg by mouth daily before breakfast.   multivitamin tablet Take 1 tablet by mouth daily.   oxyCODONE-acetaminophen 5-325 MG tablet Commonly known as: PERCOCET/ROXICET Take 1 tablet by mouth every 4 (four) hours as needed for severe pain.   tiZANidine 2 MG tablet Commonly known as: ZANAFLEX Take 1 tablet (2 mg total) by mouth every 6 (six) hours as needed.   topiramate 50 MG tablet Commonly known as: TOPAMAX Take 50 mg by mouth 2  (two) times daily.   Vitamin D3 125 MCG (5000 UT) Caps Take 5,000 Units by mouth daily.   ZOLMitriptan 2.5 MG tablet Commonly known as: ZOMIG Take 2.5 mg by mouth as needed for migraine.            Durable Medical Equipment  (From admission, onward)         Start     Ordered   12/30/19 1341  DME Walker rolling  Once       Question:  Patient needs a walker to treat with the following condition  Answer:  Status post total hip replacement, left   12/30/19 1341   12/30/19 1341  DME 3 n 1  Once        12/30/19 1341           Discharge Care Instructions  (From admission, onward)         Start     Ordered   01/01/20 0000  Weight bearing as tolerated        01/01/20 0852   12/31/19 0000  Change dressing       Comments: Change dressing Only if drainage exceeds 40% of window on dressing   12/31/19 0805          Diagnostic Studies: DG Chest 2 View  Result Date: 12/27/2019 CLINICAL DATA:  Preop. EXAM: CHEST - 2 VIEW COMPARISON:  None. FINDINGS: Lungs are adequately inflated and otherwise clear. Cardiomediastinal silhouette, bones and soft tissues are unremarkable. IMPRESSION: No active cardiopulmonary disease. Electronically Signed   By: Elberta Fortis M.D.   On: 12/27/2019 12:56   DG C-Arm 1-60 Min-No Report  Result Date: 12/30/2019 Fluoroscopy was utilized by the requesting physician.  No radiographic interpretation.   DG Hip Port Unilat With Pelvis 1V Left  Result Date: 12/30/2019 CLINICAL DATA:  Revision of left hip arthroplasty. EXAM: OPERATIVE left HIP (WITH PELVIS IF PERFORMED) 2 VIEWS TECHNIQUE: Fluoroscopic spot image(s) were submitted for interpretation post-operatively. Radiation exposure index: 0.3679 mGy. COMPARISON:  Oct 25, 2019. FINDINGS: Two intraoperative fluoroscopic images were obtained of the left hip. These images demonstrate the left acetabular and femoral components to be well situated. IMPRESSION: Fluoroscopic guidance provided during left hip  arthroplasty. Electronically Signed   By: Lupita Raider M.D.   On: 12/30/2019 12:49   DG HIP OPERATIVE UNILAT W OR W/O PELVIS LEFT  Result Date: 12/30/2019 CLINICAL DATA:  Revision of left hip arthroplasty. EXAM: OPERATIVE left HIP (WITH PELVIS IF PERFORMED) 2 VIEWS TECHNIQUE: Fluoroscopic spot image(s) were submitted for interpretation post-operatively. Radiation  exposure index: 0.3679 mGy. COMPARISON:  Oct 25, 2019. FINDINGS: Two intraoperative fluoroscopic images were obtained of the left hip. These images demonstrate the left acetabular and femoral components to be well situated. IMPRESSION: Fluoroscopic guidance provided during left hip arthroplasty. Electronically Signed   By: Lupita Raider M.D.   On: 12/30/2019 12:49    Disposition: Discharge disposition: 01-Home or Self Care       Discharge Instructions    Call MD / Call 911   Complete by: As directed    If you experience chest pain or shortness of breath, CALL 911 and be transported to the hospital emergency room.  If you develope a fever above 101 F, pus (white drainage) or increased drainage or redness at the wound, or calf pain, call your surgeon's office.   Call MD / Call 911   Complete by: As directed    If you experience chest pain or shortness of breath, CALL 911 and be transported to the hospital emergency room.  If you develope a fever above 101 F, pus (white drainage) or increased drainage or redness at the wound, or calf pain, call your surgeon's office.   Change dressing   Complete by: As directed    Change dressing Only if drainage exceeds 40% of window on dressing   Constipation Prevention   Complete by: As directed    Drink plenty of fluids.  Prune juice may be helpful.  You may use a stool softener, such as Colace (over the counter) 100 mg twice a day.  Use MiraLax (over the counter) for constipation as needed.   Constipation Prevention   Complete by: As directed    Drink plenty of fluids.  Prune juice may be  helpful.  You may use a stool softener, such as Colace (over the counter) 100 mg twice a day.  Use MiraLax (over the counter) for constipation as needed.   Diet - low sodium heart healthy   Complete by: As directed    Driving restrictions   Complete by: As directed    No driving for 2 weeks   Increase activity slowly as tolerated   Complete by: As directed    Increase activity slowly as tolerated   Complete by: As directed    Patient may shower   Complete by: As directed    You may shower without a dressing once there is no drainage.  Do not wash over the wound.  If drainage remains, cover wound with plastic wrap and then shower.   Weight bearing as tolerated   Complete by: As directed        Follow-up Information    Gean Birchwood, MD In 2 weeks.   Specialty: Orthopedic Surgery Contact information: [address] [place] [address] [phone]                Signed: Dannielle Burn 01/01/2020, 8:53 AM

## 2020-01-01 NOTE — Plan of Care (Signed)
Patient discharged home in stable condition, waiting on 3-in-1 to be delivered

## 2020-03-20 ENCOUNTER — Other Ambulatory Visit: Payer: Self-pay | Admitting: Orthopedic Surgery

## 2020-03-23 ENCOUNTER — Encounter (HOSPITAL_BASED_OUTPATIENT_CLINIC_OR_DEPARTMENT_OTHER): Payer: Self-pay | Admitting: Orthopedic Surgery

## 2020-03-26 ENCOUNTER — Other Ambulatory Visit (HOSPITAL_COMMUNITY)
Admission: RE | Admit: 2020-03-26 | Discharge: 2020-03-26 | Disposition: A | Discharge status: Home / Self Care | Payer: 59 | Source: Ambulatory Visit | Attending: Orthopedic Surgery | Admitting: Orthopedic Surgery

## 2020-03-26 DIAGNOSIS — Z01812 Encounter for preprocedural laboratory examination: Secondary | ICD-10-CM | POA: Insufficient documentation

## 2020-03-26 DIAGNOSIS — Z20822 Contact with and (suspected) exposure to covid-19: Secondary | ICD-10-CM | POA: Insufficient documentation

## 2020-03-26 LAB — SARS CORONAVIRUS 2 (TAT 6-24 HRS): SARS Coronavirus 2: NEGATIVE

## 2020-03-30 ENCOUNTER — Ambulatory Visit (HOSPITAL_BASED_OUTPATIENT_CLINIC_OR_DEPARTMENT_OTHER): Payer: 59 | Admitting: Certified Registered"

## 2020-03-30 ENCOUNTER — Other Ambulatory Visit: Payer: Self-pay

## 2020-03-30 ENCOUNTER — Ambulatory Visit (HOSPITAL_BASED_OUTPATIENT_CLINIC_OR_DEPARTMENT_OTHER)
Admission: RE | Admit: 2020-03-30 | Discharge: 2020-03-30 | Disposition: A | Discharge status: Home / Self Care | Payer: 59 | Source: Ambulatory Visit | Attending: Orthopedic Surgery | Admitting: Orthopedic Surgery

## 2020-03-30 ENCOUNTER — Encounter (HOSPITAL_BASED_OUTPATIENT_CLINIC_OR_DEPARTMENT_OTHER): Payer: Self-pay | Admitting: Orthopedic Surgery

## 2020-03-30 ENCOUNTER — Encounter (HOSPITAL_BASED_OUTPATIENT_CLINIC_OR_DEPARTMENT_OTHER)
Admission: RE | Disposition: A | Discharge status: Home / Self Care | Payer: Self-pay | Source: Ambulatory Visit | Attending: Orthopedic Surgery

## 2020-03-30 DIAGNOSIS — Z7952 Long term (current) use of systemic steroids: Secondary | ICD-10-CM | POA: Diagnosis not present

## 2020-03-30 DIAGNOSIS — E039 Hypothyroidism, unspecified: Secondary | ICD-10-CM | POA: Diagnosis not present

## 2020-03-30 DIAGNOSIS — F32A Depression, unspecified: Secondary | ICD-10-CM | POA: Diagnosis not present

## 2020-03-30 DIAGNOSIS — Z79890 Hormone replacement therapy (postmenopausal): Secondary | ICD-10-CM | POA: Insufficient documentation

## 2020-03-30 DIAGNOSIS — G43909 Migraine, unspecified, not intractable, without status migrainosus: Secondary | ICD-10-CM | POA: Diagnosis not present

## 2020-03-30 DIAGNOSIS — Z87891 Personal history of nicotine dependence: Secondary | ICD-10-CM | POA: Insufficient documentation

## 2020-03-30 DIAGNOSIS — Z79899 Other long term (current) drug therapy: Secondary | ICD-10-CM | POA: Insufficient documentation

## 2020-03-30 DIAGNOSIS — F419 Anxiety disorder, unspecified: Secondary | ICD-10-CM | POA: Diagnosis not present

## 2020-03-30 DIAGNOSIS — M7501 Adhesive capsulitis of right shoulder: Secondary | ICD-10-CM | POA: Insufficient documentation

## 2020-03-30 HISTORY — PX: CAPSULAR RELEASE: SHX6293

## 2020-03-30 SURGERY — CAPSULAR RELEASE
Anesthesia: General | Site: Shoulder | Laterality: Right

## 2020-03-30 MED ORDER — ROPIVACAINE HCL 5 MG/ML IJ SOLN
INTRAMUSCULAR | Status: DC | PRN
  Administered 2020-03-30: 30 mL via PERINEURAL

## 2020-03-30 MED ORDER — PROPOFOL 10 MG/ML IV BOLUS
INTRAVENOUS | Status: AC
  Filled 2020-03-30: qty 20

## 2020-03-30 MED ORDER — FENTANYL CITRATE (PF) 100 MCG/2ML IJ SOLN
INTRAMUSCULAR | Status: AC
  Filled 2020-03-30: qty 2

## 2020-03-30 MED ORDER — EPHEDRINE SULFATE 50 MG/ML IJ SOLN
INTRAMUSCULAR | Status: DC | PRN
  Administered 2020-03-30: 10 mg via INTRAVENOUS

## 2020-03-30 MED ORDER — HYDROMORPHONE HCL 1 MG/ML IJ SOLN: 0.2500 mg | INTRAMUSCULAR | Status: DC | PRN

## 2020-03-30 MED ORDER — MIDAZOLAM HCL 2 MG/2ML IJ SOLN
2.0000 mg | Freq: Once | INTRAMUSCULAR | Status: AC
  Administered 2020-03-30: 2 mg via INTRAVENOUS

## 2020-03-30 MED ORDER — ONDANSETRON HCL 4 MG/2ML IJ SOLN
INTRAMUSCULAR | Status: AC
  Filled 2020-03-30: qty 2

## 2020-03-30 MED ORDER — PHENYLEPHRINE 40 MCG/ML (10ML) SYRINGE FOR IV PUSH (FOR BLOOD PRESSURE SUPPORT)
PREFILLED_SYRINGE | INTRAVENOUS | Status: AC
  Filled 2020-03-30: qty 10

## 2020-03-30 MED ORDER — DEXAMETHASONE SODIUM PHOSPHATE 10 MG/ML IJ SOLN
INTRAMUSCULAR | Status: DC | PRN
  Administered 2020-03-30: 5 mg via INTRAVENOUS

## 2020-03-30 MED ORDER — OXYCODONE HCL 5 MG PO TABS: 5.0000 mg | ORAL_TABLET | Freq: Once | ORAL | Status: DC | PRN

## 2020-03-30 MED ORDER — LACTATED RINGERS IV SOLN: INTRAVENOUS | Status: DC

## 2020-03-30 MED ORDER — ONDANSETRON HCL 4 MG/2ML IJ SOLN
INTRAMUSCULAR | Status: DC | PRN
  Administered 2020-03-30: 4 mg via INTRAVENOUS

## 2020-03-30 MED ORDER — PROPOFOL 10 MG/ML IV BOLUS
INTRAVENOUS | Status: DC | PRN
  Administered 2020-03-30: 140 mg via INTRAVENOUS

## 2020-03-30 MED ORDER — LIDOCAINE 2% (20 MG/ML) 5 ML SYRINGE
INTRAMUSCULAR | Status: DC | PRN
  Administered 2020-03-30: 60 mg via INTRAVENOUS

## 2020-03-30 MED ORDER — FENTANYL CITRATE (PF) 100 MCG/2ML IJ SOLN
INTRAMUSCULAR | Status: DC | PRN
  Administered 2020-03-30: 50 ug via INTRAVENOUS

## 2020-03-30 MED ORDER — METHYLPREDNISOLONE ACETATE 80 MG/ML IJ SUSP
INTRAMUSCULAR | Status: AC
  Filled 2020-03-30: qty 1

## 2020-03-30 MED ORDER — CEFAZOLIN SODIUM-DEXTROSE 2-4 GM/100ML-% IV SOLN
INTRAVENOUS | Status: AC
  Filled 2020-03-30: qty 100

## 2020-03-30 MED ORDER — LIDOCAINE 2% (20 MG/ML) 5 ML SYRINGE
INTRAMUSCULAR | Status: AC
  Filled 2020-03-30: qty 5

## 2020-03-30 MED ORDER — FENTANYL CITRATE (PF) 100 MCG/2ML IJ SOLN
100.0000 ug | Freq: Once | INTRAMUSCULAR | Status: AC
  Administered 2020-03-30: 50 ug via INTRAVENOUS

## 2020-03-30 MED ORDER — ROCURONIUM BROMIDE 10 MG/ML (PF) SYRINGE
PREFILLED_SYRINGE | INTRAVENOUS | Status: AC
  Filled 2020-03-30: qty 10

## 2020-03-30 MED ORDER — OXYCODONE HCL 5 MG/5ML PO SOLN: 5.0000 mg | Freq: Once | ORAL | Status: DC | PRN

## 2020-03-30 MED ORDER — METHYLPREDNISOLONE ACETATE 40 MG/ML IJ SUSP
INTRAMUSCULAR | Status: AC
  Filled 2020-03-30: qty 1

## 2020-03-30 MED ORDER — SUGAMMADEX SODIUM 200 MG/2ML IV SOLN
INTRAVENOUS | Status: DC | PRN
  Administered 2020-03-30: 150 mg via INTRAVENOUS

## 2020-03-30 MED ORDER — MEPERIDINE HCL 25 MG/ML IJ SOLN: 6.2500 mg | INTRAMUSCULAR | Status: DC | PRN

## 2020-03-30 MED ORDER — PROMETHAZINE HCL 25 MG/ML IJ SOLN: 6.2500 mg | INTRAMUSCULAR | Status: DC | PRN

## 2020-03-30 MED ORDER — HYDROCODONE-ACETAMINOPHEN 5-325 MG PO TABS
1.0000 | ORAL_TABLET | ORAL | 0 refills | Status: AC | PRN
Start: 2020-03-30 — End: 2021-03-30

## 2020-03-30 MED ORDER — BUPIVACAINE HCL (PF) 0.5 % IJ SOLN
INTRAMUSCULAR | Status: DC | PRN
  Administered 2020-03-30: 7 mL

## 2020-03-30 MED ORDER — CEFAZOLIN SODIUM-DEXTROSE 2-4 GM/100ML-% IV SOLN
2.0000 g | INTRAVENOUS | Status: AC
  Administered 2020-03-30: 2 g via INTRAVENOUS

## 2020-03-30 MED ORDER — MIDAZOLAM HCL 2 MG/2ML IJ SOLN
INTRAMUSCULAR | Status: AC
  Filled 2020-03-30: qty 2

## 2020-03-30 MED ORDER — BUPIVACAINE HCL (PF) 0.5 % IJ SOLN
INTRAMUSCULAR | Status: AC
  Filled 2020-03-30: qty 30

## 2020-03-30 MED ORDER — PHENYLEPHRINE 40 MCG/ML (10ML) SYRINGE FOR IV PUSH (FOR BLOOD PRESSURE SUPPORT)
PREFILLED_SYRINGE | INTRAVENOUS | Status: DC | PRN
  Administered 2020-03-30 (×2): 80 ug via INTRAVENOUS
  Administered 2020-03-30 (×2): 120 ug via INTRAVENOUS
  Administered 2020-03-30: 80 ug via INTRAVENOUS
  Administered 2020-03-30: 120 ug via INTRAVENOUS

## 2020-03-30 MED ORDER — AMISULPRIDE (ANTIEMETIC) 5 MG/2ML IV SOLN: 10.0000 mg | Freq: Once | INTRAVENOUS | Status: DC | PRN

## 2020-03-30 MED ORDER — TRIAMCINOLONE ACETONIDE 40 MG/ML IJ SUSP
INTRAMUSCULAR | Status: AC
  Filled 2020-03-30: qty 5

## 2020-03-30 MED ORDER — SUCCINYLCHOLINE CHLORIDE 200 MG/10ML IV SOSY
PREFILLED_SYRINGE | INTRAVENOUS | Status: AC
  Filled 2020-03-30: qty 10

## 2020-03-30 SURGICAL SUPPLY — 79 items
ADH SKN CLS APL DERMABOND .7 (GAUZE/BANDAGES/DRESSINGS)
AID PSTN UNV HD RSTRNT DISP (MISCELLANEOUS) ×1
APL PRP STRL LF DISP 70% ISPRP (MISCELLANEOUS) ×1
APL SKNCLS STERI-STRIP NONHPOA (GAUZE/BANDAGES/DRESSINGS)
BENZOIN TINCTURE PRP APPL 2/3 (GAUZE/BANDAGES/DRESSINGS) IMPLANT
BLADE CLIPPER SURG (BLADE) IMPLANT
BLADE SHAVER BONE 5.0X13 (MISCELLANEOUS) ×2 IMPLANT
BLADE SURG 15 STRL LF DISP TIS (BLADE) IMPLANT
BLADE SURG 15 STRL SS (BLADE)
BURR OVAL 8 FLU 4.0X13 (MISCELLANEOUS) ×2 IMPLANT
CANNULA 5.75X71 LONG (CANNULA) ×2 IMPLANT
CANNULA TWIST IN 8.25X7CM (CANNULA) IMPLANT
CHLORAPREP W/TINT 26 (MISCELLANEOUS) ×2 IMPLANT
COVER WAND RF STERILE (DRAPES) IMPLANT
DECANTER SPIKE VIAL GLASS SM (MISCELLANEOUS) IMPLANT
DERMABOND ADVANCED (GAUZE/BANDAGES/DRESSINGS)
DERMABOND ADVANCED .7 DNX12 (GAUZE/BANDAGES/DRESSINGS) IMPLANT
DRAPE IMP U-DRAPE 54X76 (DRAPES) ×2 IMPLANT
DRAPE INCISE IOBAN 66X45 STRL (DRAPES) ×2 IMPLANT
DRAPE STERI 35X30 U-POUCH (DRAPES) ×2 IMPLANT
DRAPE SURG 17X23 STRL (DRAPES) ×2 IMPLANT
DRAPE U-SHAPE 47X51 STRL (DRAPES) ×2 IMPLANT
DRAPE U-SHAPE 76X120 STRL (DRAPES) ×4 IMPLANT
DRSG PAD ABDOMINAL 8X10 ST (GAUZE/BANDAGES/DRESSINGS) ×2 IMPLANT
ELECT REM PT RETURN 9FT ADLT (ELECTROSURGICAL) ×2
ELECTRODE REM PT RTRN 9FT ADLT (ELECTROSURGICAL) ×1 IMPLANT
GAUZE 4X4 16PLY RFD (DISPOSABLE) IMPLANT
GAUZE SPONGE 4X4 12PLY STRL (GAUZE/BANDAGES/DRESSINGS) ×2 IMPLANT
GAUZE XEROFORM 1X8 LF (GAUZE/BANDAGES/DRESSINGS) ×2 IMPLANT
GLOVE BIO SURGEON STRL SZ7 (GLOVE) ×2 IMPLANT
GLOVE BIO SURGEON STRL SZ7.5 (GLOVE) ×4 IMPLANT
GLOVE BIOGEL PI IND STRL 7.0 (GLOVE) ×2 IMPLANT
GLOVE BIOGEL PI IND STRL 8 (GLOVE) ×2 IMPLANT
GLOVE BIOGEL PI INDICATOR 7.0 (GLOVE) ×2
GLOVE BIOGEL PI INDICATOR 8 (GLOVE) ×2
GOWN STRL REUS W/ TWL LRG LVL3 (GOWN DISPOSABLE) ×2 IMPLANT
GOWN STRL REUS W/ TWL XL LVL3 (GOWN DISPOSABLE) ×1 IMPLANT
GOWN STRL REUS W/TWL LRG LVL3 (GOWN DISPOSABLE) ×4
GOWN STRL REUS W/TWL XL LVL3 (GOWN DISPOSABLE) ×2
IV NS IRRIG 3000ML ARTHROMATIC (IV SOLUTION) ×4 IMPLANT
MANIFOLD NEPTUNE II (INSTRUMENTS) ×2 IMPLANT
NDL SUT 6 .5 CRC .975X.05 MAYO (NEEDLE) IMPLANT
NEEDLE 1/2 CIR CATGUT .05X1.09 (NEEDLE) IMPLANT
NEEDLE MAYO TAPER (NEEDLE)
NEEDLE SCORPION MULTI FIRE (NEEDLE) IMPLANT
NS IRRIG 1000ML POUR BTL (IV SOLUTION) IMPLANT
PACK ARTHROSCOPY DSU (CUSTOM PROCEDURE TRAY) ×2 IMPLANT
PACK BASIN DAY SURGERY FS (CUSTOM PROCEDURE TRAY) ×2 IMPLANT
PENCIL SMOKE EVACUATOR (MISCELLANEOUS) IMPLANT
RESTRAINT HEAD UNIVERSAL NS (MISCELLANEOUS) ×2 IMPLANT
SLEEVE SCD COMPRESS KNEE MED (MISCELLANEOUS) ×2 IMPLANT
SLING ARM FOAM STRAP LRG (SOFTGOODS) ×2 IMPLANT
SPONGE LAP 4X18 RFD (DISPOSABLE) IMPLANT
STRIP CLOSURE SKIN 1/2X4 (GAUZE/BANDAGES/DRESSINGS) IMPLANT
SUCTION FRAZIER HANDLE 10FR (MISCELLANEOUS)
SUCTION TUBE FRAZIER 10FR DISP (MISCELLANEOUS) IMPLANT
SUPPORT WRAP ARM LG (MISCELLANEOUS) IMPLANT
SUT BONE WAX W31G (SUTURE) IMPLANT
SUT ETHILON 3 0 PS 1 (SUTURE) ×2 IMPLANT
SUT FIBERWIRE #2 38 T-5 BLUE (SUTURE)
SUT MNCRL AB 3-0 PS2 18 (SUTURE) IMPLANT
SUT MNCRL AB 4-0 PS2 18 (SUTURE) IMPLANT
SUT PDS AB 0 CT 36 (SUTURE) IMPLANT
SUT PROLENE 3 0 PS 2 (SUTURE) IMPLANT
SUT TIGER TAPE 7 IN WHITE (SUTURE) IMPLANT
SUT VIC AB 0 CT1 27 (SUTURE)
SUT VIC AB 0 CT1 27XBRD ANBCTR (SUTURE) IMPLANT
SUT VIC AB 2-0 SH 27 (SUTURE)
SUT VIC AB 2-0 SH 27XBRD (SUTURE) IMPLANT
SUTURE FIBERWR #2 38 T-5 BLUE (SUTURE) IMPLANT
SUTURE TAPE 1.3 FIBERLOP 20 ST (SUTURE) IMPLANT
SUTURETAPE 1.3 FIBERLOOP 20 ST (SUTURE)
SYR BULB EAR ULCER 3OZ GRN STR (SYRINGE) IMPLANT
TAPE FIBER 2MM 7IN #2 BLUE (SUTURE) IMPLANT
TOWEL GREEN STERILE FF (TOWEL DISPOSABLE) ×2 IMPLANT
TUBE CONNECTING 20X1/4 (TUBING) ×2 IMPLANT
TUBING ARTHROSCOPY IRRIG 16FT (MISCELLANEOUS) ×2 IMPLANT
WATER STERILE IRR 1000ML POUR (IV SOLUTION) ×2 IMPLANT
YANKAUER SUCT BULB TIP NO VENT (SUCTIONS) IMPLANT

## 2020-03-30 NOTE — Anesthesia Procedure Notes (Signed)
Procedure Name: Intubation Date/Time: 03/30/2020 2:20 PM Performed by: Marny Lowenstein, CRNA Pre-anesthesia Checklist: Patient identified, Emergency Drugs available, Suction available and Patient being monitored Patient Re-evaluated:Patient Re-evaluated prior to induction Oxygen Delivery Method: Circle system utilized Preoxygenation: Pre-oxygenation with 100% oxygen Induction Type: IV induction Ventilation: Mask ventilation without difficulty Laryngoscope Size: Miller and 2 Grade View: Grade I Tube type: Oral Tube size: 7.0 mm Number of attempts: 1 Airway Equipment and Method: Stylet Placement Confirmation: ETT inserted through vocal cords under direct vision,  positive ETCO2 and breath sounds checked- equal and bilateral Secured at: 20 cm Tube secured with: Tape Dental Injury: Teeth and Oropharynx as per pre-operative assessment

## 2020-03-30 NOTE — Transfer of Care (Signed)
Immediate Anesthesia Transfer of Care Note  Patient: Angelica Stone  Procedure(s) Performed: RIGHT SHOULDER CAPSULAR RELEASE AND MANIPULATION UNDER ANESTHESIA , INJECTION (Right Shoulder)  Patient Location: PACU  Anesthesia Type:GA combined with regional for post-op pain  Level of Consciousness: awake, alert , oriented and patient cooperative  Airway & Oxygen Therapy: Patient Spontanous Breathing and Patient connected to face mask oxygen  Post-op Assessment: Report given to RN and Post -op Vital signs reviewed and stable  Post vital signs: Reviewed and stable  Last Vitals:  Vitals Value Taken Time  BP    Temp    Pulse    Resp    SpO2      Last Pain:  Vitals:   03/30/20 1213  TempSrc: Oral  PainSc: 7       Patients Stated Pain Goal: 6 (03/30/20 1213)  Complications: No complications documented.

## 2020-03-30 NOTE — Discharge Instructions (Addendum)
Discharge Instructions after Arthroscopic Shoulder Surgery   A sling has been provided for you. You may remove the sling after 72 hours. The sling may be worn for your protection, if you are in a crowd.  Use ice on the shoulder intermittently over the first 48 hours after surgery.  Pain medication has been prescribed for you.  Use your medication liberally over the first 48 hours, and then begin to taper your use. You may take Extra Strength Tylenol or Tylenol only in place of the pain pills. DO NOT take ANY nonsteroidal anti-inflammatory pain medications: Advil, Motrin, Ibuprofen, Aleve, Naproxen, or Naprosyn.  You may remove your dressing after two days.  You may shower 5 days after surgery. The incision CANNOT get wet prior to 5 days. Simply allow the water to wash over the site and then pat dry. Do not rub the incision. Make sure your axilla (armpit) is completely dry after showering.  Take one aspirin a day for 2 weeks after surgery, unless you have an aspirin sensitivity/allergy or asthma.  Three to 5 times each day you should perform assisted overhead reaching and external rotation (outward turning) exercises with the operative arm. Both exercises should be done with the non-operative arm used as the "therapist arm" while the operative arm remains relaxed. Ten of each exercise should be done three to five times each day.    Overhead reach is helping to lift your stiff arm up as high as it will go. To stretch your overhead reach, lie flat on your back, relax, and grasp the wrist of the tight shoulder with your opposite hand. Using the power in your opposite arm, bring the stiff arm up as far as it is comfortable. Start holding it for ten seconds and then work up to where you can hold it for a count of 30. Breathe slowly and deeply while the arm is moved. Repeat this stretch ten times, trying to help the arm up a little higher each time.       External rotation is turning the arm out to  the side while your elbow stays close to your body. External rotation is best stretched while you are lying on your back. Hold a cane, yardstick, broom handle, or dowel in both hands. Bend both elbows to a right angle. Use steady, gentle force from your normal arm to rotate the hand of the stiff shoulder out away from your body. Continue the rotation as far as it will go comfortably, holding it there for a count of 10. Repeat this exercise ten times.     Please call [phone] during normal business hours or [phone] after hours for any problems. Including the following:  - excessive redness of the incisions - drainage for more than 4 days - fever of more than 101.5 F  *Please note that pain medications will not be refilled after hours or on weekends.     Post Anesthesia Home Care Instructions  Activity: Get plenty of rest for the remainder of the day. A responsible individual must stay with you for 24 hours following the procedure.  For the next 24 hours, DO NOT: -Drive a car -Advertising copywriter -Drink alcoholic beverages -Take any medication unless instructed by your physician -Make any legal decisions or sign important papers.  Meals: Start with liquid foods such as gelatin or soup. Progress to regular foods as tolerated. Avoid greasy, spicy, heavy foods. If nausea and/or vomiting occur, drink only clear liquids until the nausea and/or vomiting subsides.  Call your physician if vomiting continues.  Special Instructions/Symptoms: Your throat may feel dry or sore from the anesthesia or the breathing tube placed in your throat during surgery. If this causes discomfort, gargle with warm salt water. The discomfort should disappear within 24 hours.  If you had a scopolamine patch placed behind your ear for the management of post- operative nausea and/or vomiting:  1. The medication in the patch is effective for 72 hours, after which it should be removed.  Wrap patch in a tissue and  discard in the trash. Wash hands thoroughly with soap and water. 2. You may remove the patch earlier than 72 hours if you experience unpleasant side effects which may include dry mouth, dizziness or visual disturbances. 3. Avoid touching the patch. Wash your hands with soap and water after contact with the patch.     Post Anesthesia Home Care Instructions  Activity: Get plenty of rest for the remainder of the day. A responsible individual must stay with you for 24 hours following the procedure.  For the next 24 hours, DO NOT: -Drive a car -Advertising copywriter -Drink alcoholic beverages -Take any medication unless instructed by your physician -Make any legal decisions or sign important papers.  Meals: Start with liquid foods such as gelatin or soup. Progress to regular foods as tolerated. Avoid greasy, spicy, heavy foods. If nausea and/or vomiting occur, drink only clear liquids until the nausea and/or vomiting subsides. Call your physician if vomiting continues.  Special Instructions/Symptoms: Your throat may feel dry or sore from the anesthesia or the breathing tube placed in your throat during surgery. If this causes discomfort, gargle with warm salt water. The discomfort should disappear within 24 hours.  If you had a scopolamine patch placed behind your ear for the management of post- operative nausea and/or vomiting:  1. The medication in the patch is effective for 72 hours, after which it should be removed.  Wrap patch in a tissue and discard in the trash. Wash hands thoroughly with soap and water. 2. You may remove the patch earlier than 72 hours if you experience unpleasant side effects which may include dry mouth, dizziness or visual disturbances. 3. Avoid touching the patch. Wash your hands with soap and water after contact with the patch.     Post Anesthesia Home Care Instructions  Activity: Get plenty of rest for the remainder of the day. A responsible individual must stay  with you for 24 hours following the procedure.  For the next 24 hours, DO NOT: -Drive a car -Advertising copywriter -Drink alcoholic beverages -Take any medication unless instructed by your physician -Make any legal decisions or sign important papers.  Meals: Start with liquid foods such as gelatin or soup. Progress to regular foods as tolerated. Avoid greasy, spicy, heavy foods. If nausea and/or vomiting occur, drink only clear liquids until the nausea and/or vomiting subsides. Call your physician if vomiting continues.  Special Instructions/Symptoms: Your throat may feel dry or sore from the anesthesia or the breathing tube placed in your throat during surgery. If this causes discomfort, gargle with warm salt water. The discomfort should disappear within 24 hours.  If you had a scopolamine patch placed behind your ear for the management of post- operative nausea and/or vomiting:  1. The medication in the patch is effective for 72 hours, after which it should be removed.  Wrap patch in a tissue and discard in the trash. Wash hands thoroughly with soap and water. 2. You may remove the  patch earlier than 72 hours if you experience unpleasant side effects which may include dry mouth, dizziness or visual disturbances. 3. Avoid touching the patch. Wash your hands with soap and water after contact with the patch.      Regional Anesthesia Blocks  1. Numbness or the inability to move the "blocked" extremity may last from 3-48 hours after placement. The length of time depends on the medication injected and your individual response to the medication. If the numbness is not going away after 48 hours, call your surgeon.  2. The extremity that is blocked will need to be protected until the numbness is gone and the  Strength has returned. Because you cannot feel it, you will need to take extra care to avoid injury. Because it may be weak, you may have difficulty moving it or using it. You may not know what  position it is in without looking at it while the block is in effect.  3. For blocks in the legs and feet, returning to weight bearing and walking needs to be done carefully. You will need to wait until the numbness is entirely gone and the strength has returned. You should be able to move your leg and foot normally before you try and bear weight or walk. You will need someone to be with you when you first try to ensure you do not fall and possibly risk injury.  4. Bruising and tenderness at the needle site are common side effects and will resolve in a few days.  5. Persistent numbness or new problems with movement should be communicated to the surgeon or the [facility] [phone] [facility] [phone]).

## 2020-03-30 NOTE — Op Note (Signed)
Procedure(s): RIGHT SHOULDER CAPSULAR RELEASE AND MANIPULATION UNDER ANESTHESIA , INJECTION Procedure Note  Angelica Stone female 64 y.o. 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  Postoperative diagnosis: Same  Procedure(s) and Anesthesia Type: #1   right shoulder arthroscopic capsular release with manipulation under anesthesia #2 right shoulder intra-articular corticosteroid injection  Surgeon(s) and Role:    Jones Broom, MD - Primary     Surgeon: Berline Lopes   Assistants: None  Anesthesia: General endotracheal anesthesia with preoperative interscalene block given by the attending anesthesiologist     Procedure Detail  RIGHT SHOULDER CAPSULAR RELEASE AND MANIPULATION UNDER ANESTHESIA , INJECTION  Estimated Blood Loss: Min         Drains: none  Blood Given: none         Specimens: none        Complications:  * No complications entered in OR log *         Disposition: PACU - hemodynamically stable.         Condition: stable    Procedure:   INDICATIONS FOR SURGERY: The patient is 64 y.o. female who has had a long history of right shoulder pain and stiffness which has been refractory to nonoperative management with injections and physical therapy.  Indicated for arthroscopic capsular release.  OPERATIVE FINDINGS: Examination under anesthesia: She was noted to have severe stiffness limited to 0 degrees external rotation at the side and 60 degrees forward flexion.  She only comes about 45 degrees of abduction and in this position had very little external or internal rotation.  DESCRIPTION OF PROCEDURE: The patient was identified in preoperative  holding area where I personally marked the operative site after  verifying site, side, and procedure with the patient. An interscalene block was given by the attending anesthesiologist the holding area.  The patient was taken back to the operating room where general anesthesia  was induced without complication and was placed in the beach-chair position with the back  elevated about 60 degrees and all extremities and head and neck carefully padded and  positioned.   The right upper extremity was then prepped and  draped in a standard sterile fashion. The appropriate time-out  procedure was carried out. The patient did receive IV antibiotics  within 30 minutes of incision.   A small posterior portal incision was made and the arthroscope was introduced into the joint. An anterior portal was then established above the subscapularis using needle localization. Small cannula was placed anteriorly. Diagnostic arthroscopy was then carried out.  The joint was noted to be extremely tight.  Anteriorly she had extensive scar tissue in the rotator interval and the subscapularis was not immediately able to be identified.  Attention was first turned to the rotator interval where an extensive release was carried out.  The scar tissue overlying the subscapularis was resected and the underlying tendon was identified and preserved.  The capsule was then released down anteriorly sequentially using ArthroCare and a large biter taking great care to stay right on the capsule to avoid any neurovascular injury.  I was able to get down to about 5 o'clock position anteriorly safely.  At this point switching sticks were used to bring the camera anteriorly and the posterior capsule was visualized.  There was extensive erythema and inflammation.  This capsule was also thickened.  The ArthroCare was used to release superiorly and inferiorly down the posterior capsule.  At this point the arthroscopic equipment was removed from the joint  and a manipulation was carried out with satisfactory lysis of adhesions and recovery of motion.  I was able to achieve about 170 degrees forward flexion, external rotation at the side to about 60 degrees, abduction past 90 degrees and in this position I could easily bring up  to 90 degrees of external rotation and about 60 degrees of internal rotation.  The arthroscope was placed back in the joint and the integrity of the rotator cuff was verified.  The remainder of the joint showed no new injury with the manipulation.  At this point the spinal needle was placed through the anterior portal and verified to be in the joint.  The arthroscopic equipment was removed and through the anterior spinal needle an injection was given containing 7 mL of quarter percent Marcaine without epinephrine and 1 mL of 40 mg Kenalog.    the portals were closed with 3-0 nylon in an interrupted fashion. Sterile dressings were then applied including Xeroform 4 x 4's ABDs and tape. The patient was then allowed to awaken from general anesthesia, placed in a sling, transferred to the stretcher and taken to the recovery room in stable condition.   POSTOPERATIVE PLAN: The patient will be discharged home today and will followup in one week for suture removal and wound check.  We will get her back into therapy right away.

## 2020-03-30 NOTE — Progress Notes (Signed)
Assisted Dr. Miller with right, ultrasound guided, interscalene  block. Side rails up, monitors on throughout procedure. See vital signs in flow sheet. Tolerated Procedure well. 

## 2020-03-30 NOTE — Anesthesia Preprocedure Evaluation (Signed)
Anesthesia Evaluation  Patient identified by MRN, date of birth, ID band Patient awake    Reviewed: Allergy & Precautions, NPO status , Patient's Chart, lab work & pertinent test results  History of Anesthesia Complications Negative for: history of anesthetic complications  Airway Mallampati: II  TM Distance: >3 FB Neck ROM: Full    Dental  (+) Missing,    Pulmonary former smoker,    Pulmonary exam normal        Cardiovascular negative cardio ROS Normal cardiovascular exam     Neuro/Psych  Headaches, Anxiety Depression    GI/Hepatic negative GI ROS, Neg liver ROS,   Endo/Other  Hypothyroidism   Renal/GU negative Renal ROS  negative genitourinary   Musculoskeletal  (+) Arthritis ,   Abdominal   Peds  Hematology negative hematology ROS (+)   Anesthesia Other Findings Day of surgery medications reviewed with patient.  Reproductive/Obstetrics negative OB ROS                             Anesthesia Physical  Anesthesia Plan  ASA: II  Anesthesia Plan: General   Post-op Pain Management:    Induction: Intravenous  PONV Risk Score and Plan: 3 and Treatment may vary due to age or medical condition, Ondansetron, Dexamethasone and Midazolam  Airway Management Planned: LMA  Additional Equipment: None  Intra-op Plan:   Post-operative Plan: Extubation in OR  Informed Consent: I have reviewed the patients History and Physical, chart, labs and discussed the procedure including the risks, benefits and alternatives for the proposed anesthesia with the patient or authorized representative who has indicated his/her understanding and acceptance.       Plan Discussed with: CRNA  Anesthesia Plan Comments:         Anesthesia Quick Evaluation

## 2020-03-30 NOTE — H&P (Signed)
Angelica Stone is an 64 y.o. female.   Chief Complaint: R shoulder stiffness HPI: Severe R frozen shoulder, failed conservative tx.  Past Medical History:  Diagnosis Date  . Anxiety   . Arthritis   . Collagenous colitis   . Depression   . Gallstone   . History of kidney stones   . Hypothyroidism   . Migraines   . MVP (mitral valve prolapse)    early 20's  . Pneumonia   . Swelling of both ankles   . Varicose veins of left leg with edema     Past Surgical History:  Procedure Laterality Date  . BARTHOLIN GLAND CYST EXCISION Bilateral   . COLONOSCOPY    . HIP RESURFACING Left 2018  . SALPINGECTOMY Left   . TOTAL ABDOMINAL HYSTERECTOMY  2006  . TOTAL HIP REVISION Left 12/30/2019   Procedure: LEFT TOTAL HIP REVISION;  Surgeon: Gean Birchwood, MD;  Location: [facility];  Service: Orthopedics;  Laterality: Left;  . UPPER GI ENDOSCOPY      Family History  Problem Relation Age of Onset  . Heart disease Mother   . Dementia Mother   . Arthritis Mother   . Bladder Cancer Father   . Diabetes Father   . Heart disease Father   . Arthritis Father   . Diabetes Sister   . Arthritis Sister   . Dementia Maternal Grandmother   . Heart disease Maternal Grandfather   . Diabetes Paternal Grandfather   . Diabetes Sister   . Arthritis Sister   . Arthritis Sister   . Dementia Maternal Aunt        x 2   Social History:  reports that she quit smoking about 4 years ago. Her smoking use included cigarettes. She has never used smokeless tobacco. She reports current alcohol use. She reports that she does not use drugs.  Allergies:  Allergies  Allergen Reactions  . Sulfa Antibiotics Swelling and Rash    Swelling around mouth and throat    Medications Prior to Admission  Medication Sig Dispense Refill  . budesonide (ENTOCORT EC) 3 MG 24 hr capsule Take 2 capsules alternating with 1 capsule x 1 month 60 capsule 6  . calcium carbonate (OSCAL) 1500 (600 Ca) MG TABS tablet Take 600 mg of elemental  calcium by mouth daily.     . Cholecalciferol (VITAMIN D3) 125 MCG (5000 UT) CAPS Take 5,000 Units by mouth daily.     . citalopram (CELEXA) 40 MG tablet Take 40 mg by mouth daily.    . cyanocobalamin 2000 MCG tablet Take 2,000 mcg by mouth daily.    . cyclobenzaprine (FLEXERIL) 10 MG tablet Take 10 mg by mouth at bedtime. For shoulder pain    . gabapentin (NEURONTIN) 100 MG capsule Take 1 capsule (100 mg total) by mouth at bedtime. 30 capsule 2  . levothyroxine (SYNTHROID) 112 MCG tablet Take 112 mcg by mouth daily before breakfast.     . Multiple Vitamin (MULTIVITAMIN) tablet Take 1 tablet by mouth daily.    [name] Kitchen topiramate (TOPAMAX) 50 MG tablet Take 50 mg by mouth 2 (two) times daily.    [name] Kitchen ZOLMitriptan (ZOMIG) 2.5 MG tablet Take 2.5 mg by mouth as needed for migraine.       No results found for this or any previous visit (from the past 48 hour(s)). No results found.  Review of Systems  All other systems reviewed and are negative.   Blood pressure 110/75, pulse 84, temperature 97.6 F (36.4 C),  temperature source Oral, resp. rate 13, height 5\' 9"  (1.753 m), weight 53 kg, SpO2 100 %. Physical Exam HENT:     Head: Atraumatic.  Eyes:     Extraocular Movements: Extraocular movements intact.  Cardiovascular:     Pulses: Normal pulses.  Pulmonary:     Effort: Pulmonary effort is normal.  Musculoskeletal:     Comments: Pain and decreased ROM R shoulder  Neurological:     Mental Status: She is alert.  Psychiatric:        Mood and Affect: Mood normal.      Assessment/Plan Severe R frozen shoulder, failed conservative tx. Plan arth caps release, MUA, steroid injection. Risks / benefits of surgery discussed Consent on chart  NPO for OR Preop antibiotics   , MD 03/30/2020, 1:59 PM

## 2020-03-30 NOTE — Anesthesia Procedure Notes (Signed)
Anesthesia Regional Block: Interscalene brachial plexus block   Pre-Anesthetic Checklist: ,, timeout performed, Correct Patient, Correct Site, Correct Laterality, Correct Procedure, Correct Position, site marked, Risks and benefits discussed,  Surgical consent,  Pre-op evaluation,  At surgeon's request and post-op pain management  Laterality: Right  Prep: chloraprep       Needles:  Injection technique: Single-shot  Needle Type: Stimiplex     Needle Length: 9cm  Needle Gauge: 21     Additional Needles:   Procedures:,,,, ultrasound used (permanent image in chart),,,,  Narrative:  Start time: 03/30/2020 12:43 PM End time: 03/30/2020 12:48 PM Injection made incrementally with aspirations every 5 mL.  Performed by: Personally  Anesthesiologist: Lowella Curb, MD

## 2020-03-30 NOTE — Anesthesia Postprocedure Evaluation (Signed)
Anesthesia Post Note  Patient: Angelica Stone  Procedure(s) Performed: RIGHT SHOULDER CAPSULAR RELEASE AND MANIPULATION UNDER ANESTHESIA , INJECTION (Right Shoulder)     Patient location during evaluation: PACU Anesthesia Type: General Level of consciousness: awake and alert Pain management: pain level controlled Vital Signs Assessment: post-procedure vital signs reviewed and stable Respiratory status: spontaneous breathing, nonlabored ventilation and respiratory function stable Cardiovascular status: blood pressure returned to baseline and stable Postop Assessment: no apparent nausea or vomiting Anesthetic complications: no   No complications documented.  Last Vitals:  Vitals:   03/30/20 1520 03/30/20 1530  BP:  127/78  Pulse:  88  Resp:  13  Temp: (!) 35.7 C   SpO2: 100% 98%    Last Pain:  Vitals:   03/30/20 1213  TempSrc: Oral  PainSc: 7                  Lowella Curb

## 2020-03-31 ENCOUNTER — Encounter (HOSPITAL_BASED_OUTPATIENT_CLINIC_OR_DEPARTMENT_OTHER): Payer: Self-pay | Admitting: Orthopedic Surgery

## 2020-08-06 ENCOUNTER — Other Ambulatory Visit: Payer: Self-pay | Admitting: Family Medicine

## 2020-08-06 DIAGNOSIS — Z1231 Encounter for screening mammogram for malignant neoplasm of breast: Secondary | ICD-10-CM

## 2021-10-23 IMAGING — MR MR HIP*L* W/O CM
5 of 6 series · 33 of 40 positions shown · non-contrast
Comparison: Left hip x-rays dated October 25, 2019. CT abdomen pelvis
dated September 04, 2019.

CLINICAL DATA: Chronic left hip pain.

EXAM:
MR OF THE LEFT HIP WITHOUT CONTRAST
TECHNIQUE: Multiplanar, multisequence MR imaging was performed. No intravenous
contrast was administered.

[Series 3: T1 · axial · 4.0mm · 1.29mm/px · z∈[-184,+61]mm · 9 of 50 slices shown (1 of 2)]
[im 1/50]
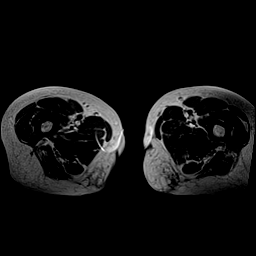
[im 7/50]
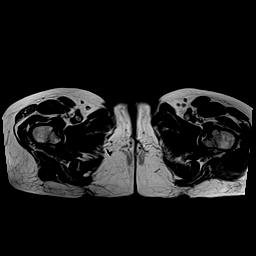
[im 13/50]
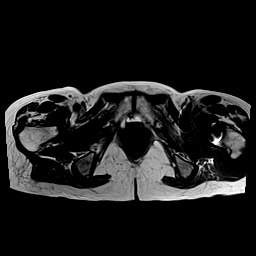
[im 19/50]
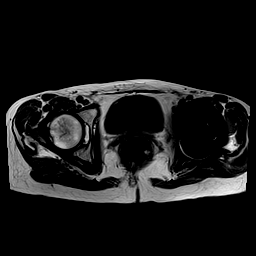
[im 25/50]
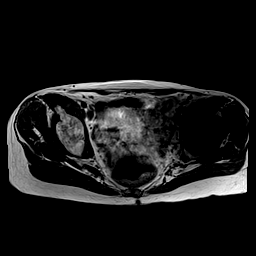
[im 31/50]
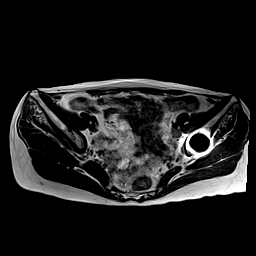
[im 37/50]
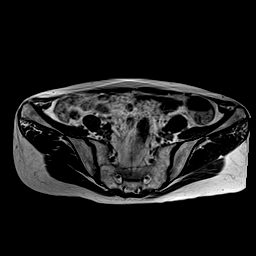
[im 43/50]
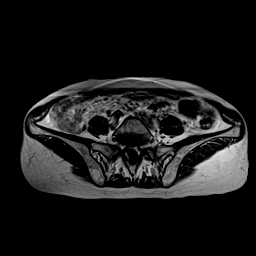
[im 50/50]
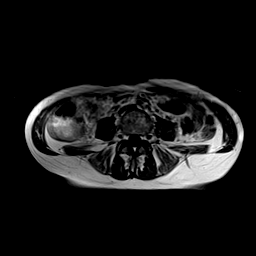

[Series 4: T2 · axial · 4.0mm · 0.64mm/px · z∈[-184,+61]mm · 9 of 50 slices shown]
[im 1/50]
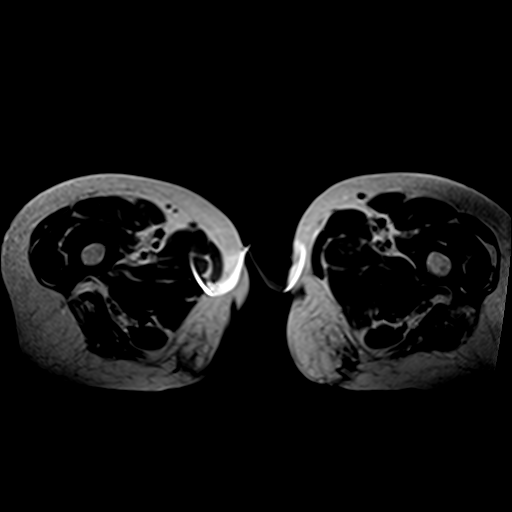
[im 7/50]
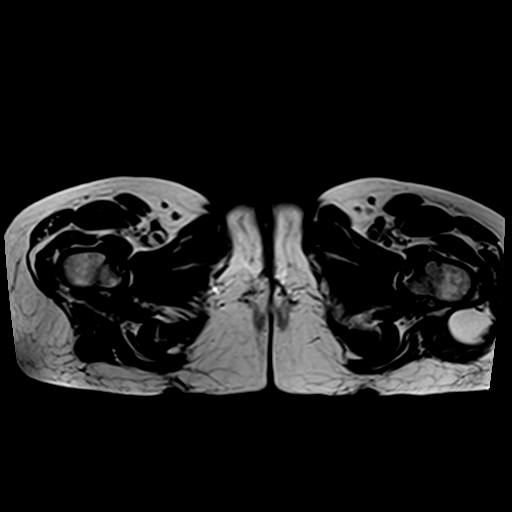
[im 13/50]
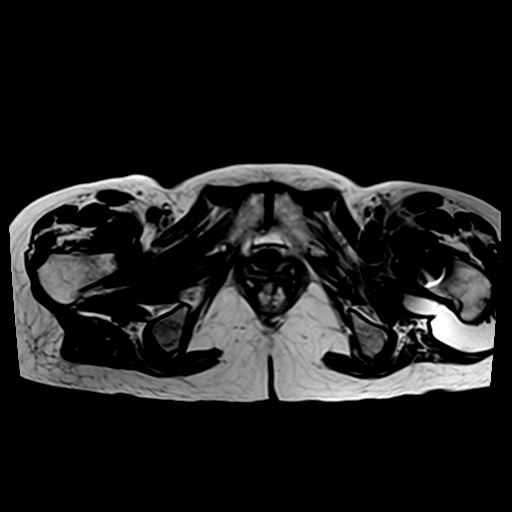
[im 19/50]
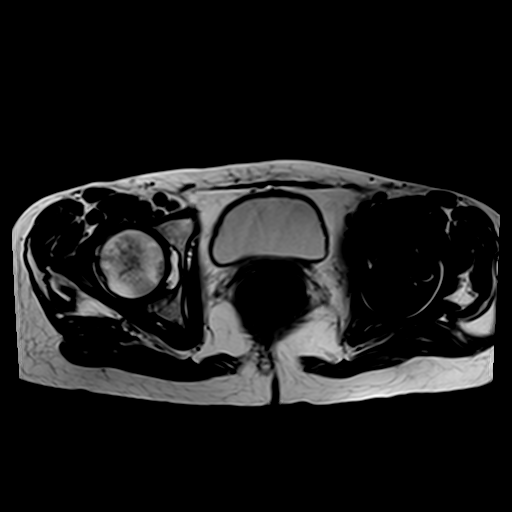
[im 25/50]
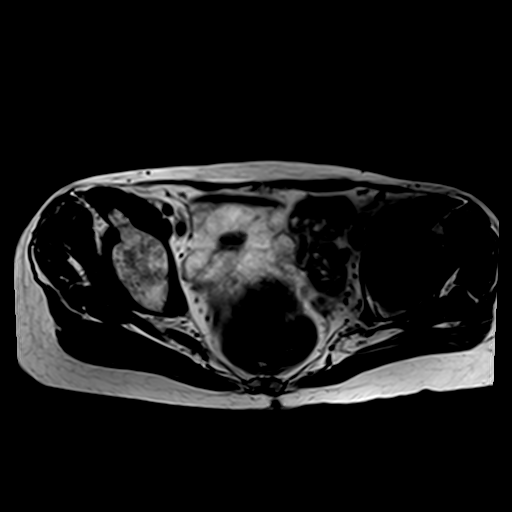
[im 31/50]
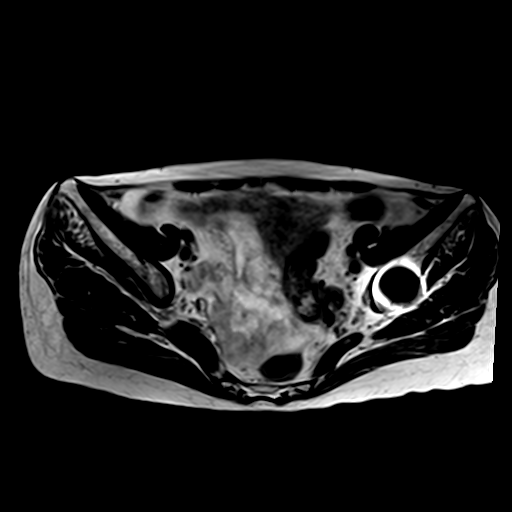
[im 37/50]
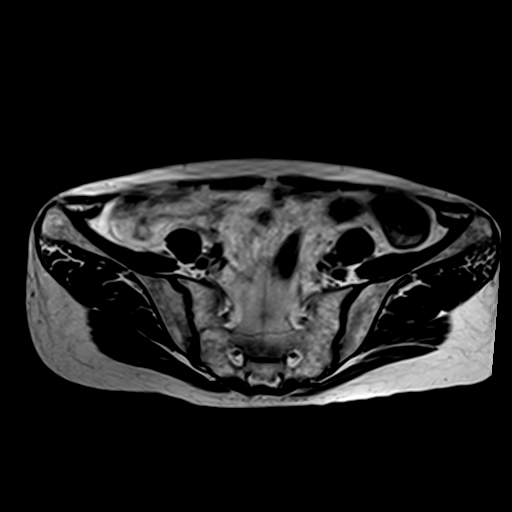
[im 43/50]
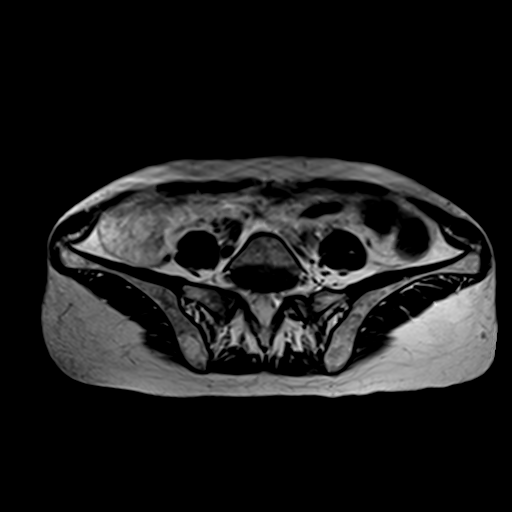
[im 50/50]
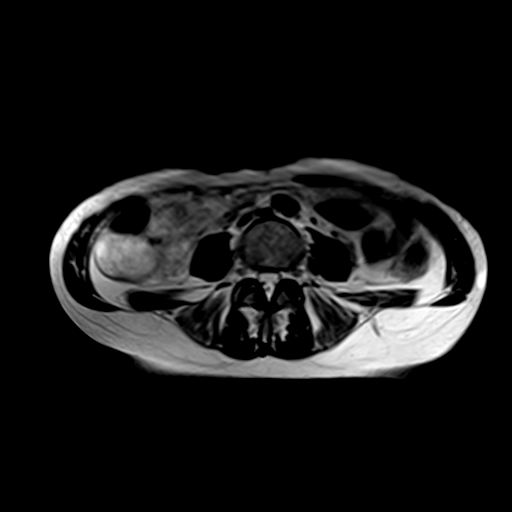

[Series 5: STIR · axial · 4.0mm · 1.29mm/px · z∈[-184,+61]mm · 9 of 50 slices shown (1 of 2)]
[im 1/50]
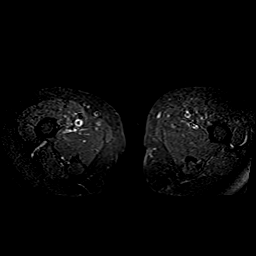
[im 7/50]
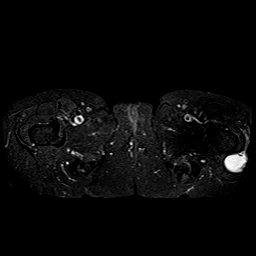
[im 13/50]
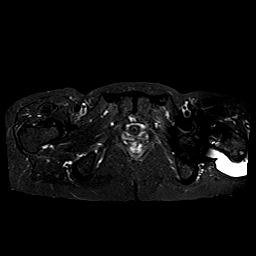
[im 19/50]
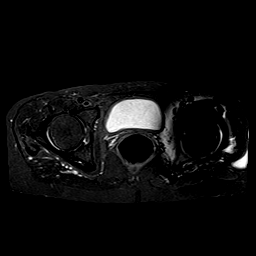
[im 25/50]
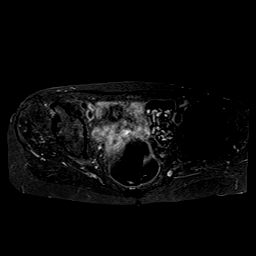
[im 31/50]
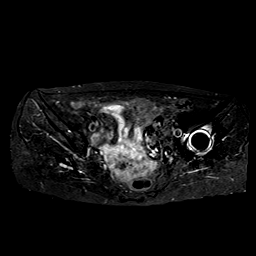
[im 37/50]
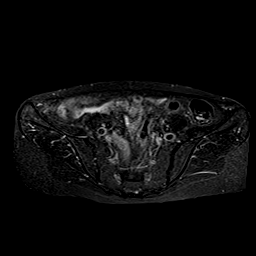
[im 43/50]
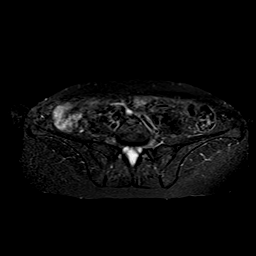
[im 50/50]
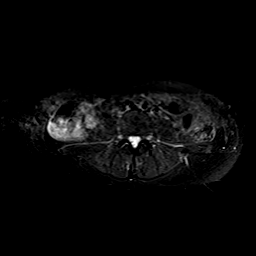

[Series 6: T1 · coronal · 4.0mm · 1.25mm/px · 4 of 24 slices shown (2 of 2)]
[im 1/24]
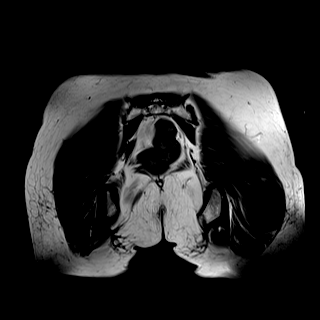
[im 8/24]
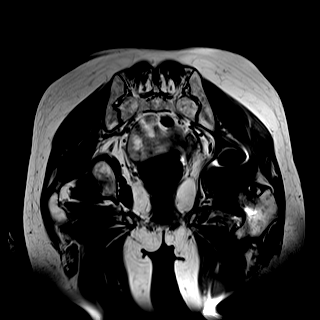
[im 16/24]
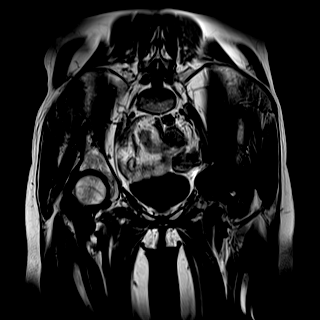
[im 24/24]
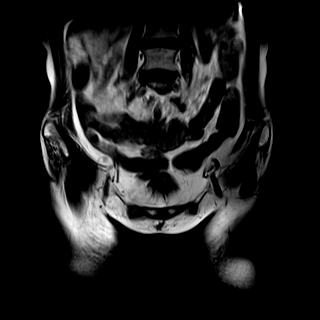

[Series 7: STIR · coronal · 4.0mm · 0.78mm/px · 2 of 24 slices shown (2 of 2)]
[im 1/24]
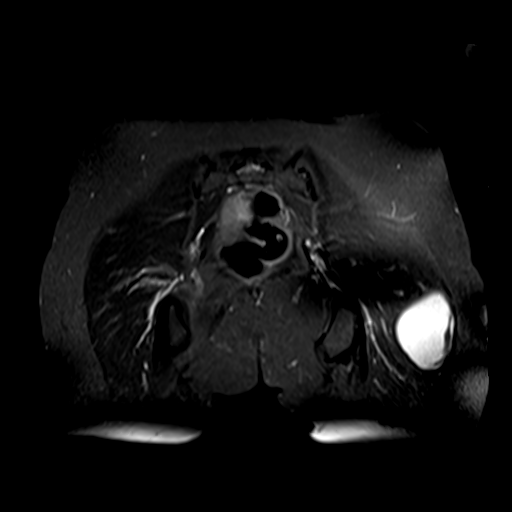
[im 8/24]
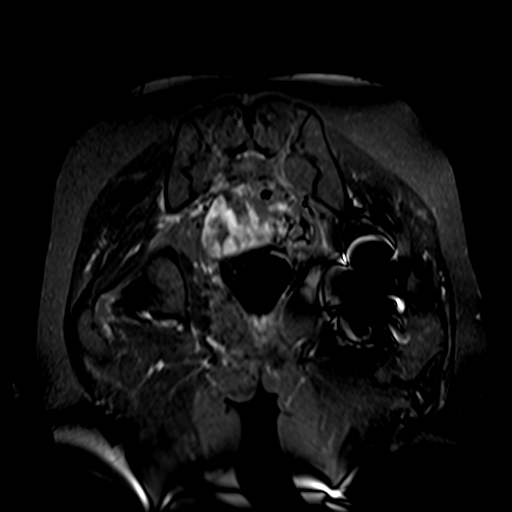

[33 of 40 positions shown; findings below may reference images not displayed]

FINDINGS: Bones: Susceptibility artifact related to left hip resurfacing
arthroplasty. There is no evidence of acute fracture, dislocation or
avascular necrosis. No focal bone lesion. The visualized sacroiliac
joints and symphysis pubis appear normal.

Articular cartilage and labrum

Articular cartilage: No focal chondral defect or subchondral signal
abnormality identified in the right hip joint.

Labrum: The right labrum is grossly intact, although evaluation is
limited due to lack of intra-articular fluid. No paralabral
abnormality.

Joint or bursal effusion

Left hip joint effusion communicating with an incompletely
visualized 7.2 x 8.0 x 7.3 cm fluid collection posterior to the
proximal femur and wrapping around the greater trochanter laterally
and anteriorly. The fluid collection has a low signal rim and there
are small foci of low T1 and T2 signal in the inferolateral portion
of fluid collection.

No right hip joint effusion or periarticular fluid collection.

Muscles and tendons

Muscles and tendons: The visualized gluteus, hamstring and iliopsoas
tendons appear normal. No muscle edema or atrophy.

Other findings

Miscellaneous: Prior hysterectomy. Sigmoid diverticulosis. The
visualized internal pelvic contents otherwise appear unremarkable.
IMPRESSION: 1. Prior left hip resurfacing arthroplasty with incompletely
visualized 7.2 x 8.0 x 7.3 cm periarticular fluid collection
posterior to the proximal femur and wrapping around the greater
trochanter laterally and anteriorly, consistent with pseudotumor.

## 2022-03-01 DIAGNOSIS — K219 Gastro-esophageal reflux disease without esophagitis: Secondary | ICD-10-CM | POA: Diagnosis not present

## 2022-03-01 DIAGNOSIS — G4700 Insomnia, unspecified: Secondary | ICD-10-CM | POA: Diagnosis not present

## 2022-03-01 DIAGNOSIS — R69 Illness, unspecified: Secondary | ICD-10-CM | POA: Diagnosis not present

## 2022-03-29 DIAGNOSIS — E782 Mixed hyperlipidemia: Secondary | ICD-10-CM | POA: Diagnosis not present

## 2022-03-29 DIAGNOSIS — R002 Palpitations: Secondary | ICD-10-CM | POA: Diagnosis not present

## 2022-03-29 DIAGNOSIS — E039 Hypothyroidism, unspecified: Secondary | ICD-10-CM | POA: Diagnosis not present

## 2022-03-29 DIAGNOSIS — G44009 Cluster headache syndrome, unspecified, not intractable: Secondary | ICD-10-CM | POA: Diagnosis not present

## 2022-03-29 DIAGNOSIS — R519 Headache, unspecified: Secondary | ICD-10-CM | POA: Diagnosis not present

## 2022-03-29 DIAGNOSIS — R69 Illness, unspecified: Secondary | ICD-10-CM | POA: Diagnosis not present

## 2022-03-29 DIAGNOSIS — G43909 Migraine, unspecified, not intractable, without status migrainosus: Secondary | ICD-10-CM | POA: Diagnosis not present

## 2022-03-29 DIAGNOSIS — G43109 Migraine with aura, not intractable, without status migrainosus: Secondary | ICD-10-CM | POA: Diagnosis not present

## 2022-03-30 DIAGNOSIS — R002 Palpitations: Secondary | ICD-10-CM | POA: Diagnosis not present

## 2022-03-30 DIAGNOSIS — E039 Hypothyroidism, unspecified: Secondary | ICD-10-CM | POA: Diagnosis not present

## 2022-03-30 DIAGNOSIS — E559 Vitamin D deficiency, unspecified: Secondary | ICD-10-CM | POA: Diagnosis not present

## 2022-04-06 DIAGNOSIS — Z23 Encounter for immunization: Secondary | ICD-10-CM | POA: Diagnosis not present

## 2022-04-06 DIAGNOSIS — Z7185 Encounter for immunization safety counseling: Secondary | ICD-10-CM | POA: Diagnosis not present

## 2022-04-06 DIAGNOSIS — E782 Mixed hyperlipidemia: Secondary | ICD-10-CM | POA: Diagnosis not present

## 2022-04-06 DIAGNOSIS — R002 Palpitations: Secondary | ICD-10-CM | POA: Diagnosis not present

## 2022-04-06 DIAGNOSIS — E039 Hypothyroidism, unspecified: Secondary | ICD-10-CM | POA: Diagnosis not present

## 2022-04-13 ENCOUNTER — Emergency Department (HOSPITAL_COMMUNITY): Payer: 59

## 2022-04-13 ENCOUNTER — Encounter (HOSPITAL_COMMUNITY): Payer: Self-pay

## 2022-04-13 ENCOUNTER — Other Ambulatory Visit: Payer: Self-pay

## 2022-04-13 ENCOUNTER — Emergency Department (HOSPITAL_COMMUNITY)
Admission: EM | Admit: 2022-04-13 | Discharge: 2022-04-13 | Disposition: A | Discharge status: Home / Self Care | Payer: 59 | Attending: Student | Admitting: Student

## 2022-04-13 DIAGNOSIS — R079 Chest pain, unspecified: Secondary | ICD-10-CM | POA: Diagnosis not present

## 2022-04-13 DIAGNOSIS — Z743 Need for continuous supervision: Secondary | ICD-10-CM | POA: Diagnosis not present

## 2022-04-13 DIAGNOSIS — R0789 Other chest pain: Secondary | ICD-10-CM | POA: Diagnosis not present

## 2022-04-13 DIAGNOSIS — Z7982 Long term (current) use of aspirin: Secondary | ICD-10-CM | POA: Insufficient documentation

## 2022-04-13 DIAGNOSIS — R110 Nausea: Secondary | ICD-10-CM | POA: Diagnosis not present

## 2022-04-13 DIAGNOSIS — R0602 Shortness of breath: Secondary | ICD-10-CM | POA: Diagnosis not present

## 2022-04-13 DIAGNOSIS — R52 Pain, unspecified: Secondary | ICD-10-CM | POA: Diagnosis not present

## 2022-04-13 DIAGNOSIS — N200 Calculus of kidney: Secondary | ICD-10-CM | POA: Diagnosis not present

## 2022-04-13 DIAGNOSIS — R002 Palpitations: Secondary | ICD-10-CM

## 2022-04-13 DIAGNOSIS — K8020 Calculus of gallbladder without cholecystitis without obstruction: Secondary | ICD-10-CM | POA: Diagnosis not present

## 2022-04-13 DIAGNOSIS — R531 Weakness: Secondary | ICD-10-CM | POA: Diagnosis not present

## 2022-04-13 LAB — BASIC METABOLIC PANEL
Anion gap: 14 (ref 5–15)
BUN: 22 mg/dL (ref 8–23)
CO2: 21 mmol/L — ABNORMAL LOW (ref 22–32)
Calcium: 10.2 mg/dL (ref 8.9–10.3)
Chloride: 103 mmol/L (ref 98–111)
Creatinine, Ser: 0.88 mg/dL (ref 0.44–1.00)
GFR, Estimated: >60 mL/min (ref >60)
Glucose, Bld: 103 mg/dL — ABNORMAL HIGH (ref 70–99)
Potassium: 4.6 mmol/L (ref 3.5–5.1)
Sodium: 138 mmol/L (ref 135–145)

## 2022-04-13 LAB — CBC
HCT: 47.2 % — ABNORMAL HIGH (ref 36.0–46.0)
Hemoglobin: 15.9 g/dL — ABNORMAL HIGH (ref 12.0–15.0)
MCH: 31.9 pg (ref 26.0–34.0)
MCHC: 33.7 g/dL (ref 30.0–36.0)
MCV: 94.8 fL (ref 80.0–100.0)
Platelets: 310 10*3/uL (ref 150–400)
RBC: 4.98 MIL/uL (ref 3.87–5.11)
RDW: 11.6 % (ref 11.5–15.5)
WBC: 8.5 10*3/uL (ref 4.0–10.5)
nRBC: 0 % (ref 0.0–0.2)

## 2022-04-13 LAB — TSH: TSH: 0.024 u[IU]/mL — ABNORMAL LOW (ref 0.350–4.500)

## 2022-04-13 LAB — BRAIN NATRIURETIC PEPTIDE: B Natriuretic Peptide: 16 pg/mL (ref 0.0–100.0)

## 2022-04-13 LAB — TROPONIN I (HIGH SENSITIVITY): Troponin I (High Sensitivity): 4 ng/L (ref <18)

## 2022-04-13 LAB — MAGNESIUM: Magnesium: 2.5 mg/dL — ABNORMAL HIGH (ref 1.7–2.4)

## 2022-04-13 MED ORDER — IOHEXOL 350 MG/ML SOLN
75.0000 mL | Freq: Once | INTRAVENOUS | Status: AC | PRN
  Administered 2022-04-13: 75 mL via INTRAVENOUS

## 2022-04-13 NOTE — ED Provider Triage Note (Signed)
Emergency Medicine Provider Triage Evaluation Note  Angelica Stone , a 66 y.o. female  was evaluated in triage.  Pt complains of chest pain. Starting this morning which has been constant.  Is worse with inspiration.  No nausea or vomiting.  She is like she is having palpitations like her heart is skipping a beat, no history of the same.  Brought by EMS, aspirin prior to arrival.  No history of PE or ACS. denies recent surgery or travel  Review of Systems  Per HPI  Physical Exam  BP 105/75 (BP Location: Right Arm)   Pulse 99   Temp (!) 97.4 F (36.3 C) (Oral)   Resp 18   Ht 5\' 9"  (1.753 m)   Wt 56.7 kg   SpO2 99%   BMI 18.46 kg/m  Gen:   Awake, no distress   Resp:  Normal effort  MSK:   Moves extremities without difficulty  Other:  Tachycardic   Medical Decision Making  Medically screening exam initiated at 2:38 PM.  Appropriate orders placed.  Angelica Stone was informed that the remainder of the evaluation will be completed by another provider, this initial triage assessment does not replace that evaluation, and the importance of remaining in the ED until their evaluation is complete.     Jari Pigg, PA-C 04/13/22 1439

## 2022-04-13 NOTE — ED Provider Notes (Signed)
[facility] EMERGENCY DEPARTMENT Provider Note   CSN: [id number] Arrival date & time: 04/13/22  1401     History  No chief complaint on file.   Angelica Stone is a 66 y.o. female.  HPI   Patient presents due to chest pain starting this morning around 10 AM.  Is been intermittent, worse when she breathes.  Denies any exertional component, nausea, vomiting.  It does not radiate.  No history of ACS or PE.  Denies any recent travel or surgeries.  She does have a family history of cardiac events but none personal.  She was given aspirin by EMS, no change in symptoms.  Home Medications Prior to Admission medications   Medication Sig Start Date End Date Taking? Authorizing Provider  budesonide (ENTOCORT EC) 3 MG 24 hr capsule Take 2 capsules alternating with 1 capsule x 1 month 11/08/19   Rachael Fee, MD  calcium carbonate (OSCAL) 1500 (600 Ca) MG TABS tablet Take 600 mg of elemental calcium by mouth daily.     [provider]  Cholecalciferol (VITAMIN D3) 125 MCG (5000 UT) CAPS Take 5,000 Units by mouth daily.     [provider]  citalopram (CELEXA) 40 MG tablet Take 40 mg by mouth daily.    [provider]  cyanocobalamin 2000 MCG tablet Take 2,000 mcg by mouth daily.    [provider]  cyclobenzaprine (FLEXERIL) 10 MG tablet Take 10 mg by mouth at bedtime. For shoulder pain    [provider]  gabapentin (NEURONTIN) 100 MG capsule Take 1 capsule (100 mg total) by mouth at bedtime. 01/01/20 12/31/20  Allena Katz, PA-C  levothyroxine (SYNTHROID) 112 MCG tablet Take 112 mcg by mouth daily before breakfast.     [provider]  Multiple Vitamin (MULTIVITAMIN) tablet Take 1 tablet by mouth daily.    [provider]  topiramate (TOPAMAX) 50 MG tablet Take 50 mg by mouth 2 (two) times daily.    [provider]  ZOLMitriptan (ZOMIG) 2.5 MG tablet Take 2.5 mg by mouth as needed for migraine.     [provider]       Allergies    Sulfa antibiotics    Review of Systems   Review of Systems  Physical Exam Updated Vital Signs BP 116/83   Pulse 85   Temp (!) 97.5 F (36.4 C) (Oral)   Resp 15   Ht 5\' 9"  (1.753 m)   Wt 56.7 kg   SpO2 97%   BMI 18.46 kg/m  Physical Exam Vitals and nursing note reviewed. Exam conducted with a chaperone present.  Constitutional:      Appearance: Normal appearance.  HENT:     Head: Normocephalic and atraumatic.  Eyes:     General: No scleral icterus.       Right eye: No discharge.        Left eye: No discharge.     Extraocular Movements: Extraocular movements intact.     Pupils: Pupils are equal, round, and reactive to light.  Cardiovascular:     Rate and Rhythm: Regular rhythm. Tachycardia present.     Pulses: Normal pulses.     Heart sounds: Normal heart sounds. No murmur heard.    No friction rub. No gallop.     Comments: Mildly tachycardic with regular rhythm Pulmonary:     Effort: Pulmonary effort is normal. No respiratory distress.     Breath sounds: Normal breath sounds.  Abdominal:     General: Abdomen  is flat. Bowel sounds are normal. There is no distension.     Palpations: Abdomen is soft.     Tenderness: There is no abdominal tenderness.  Skin:    General: Skin is warm and dry.     Coloration: Skin is not jaundiced.  Neurological:     Mental Status: She is alert. Mental status is at baseline.     Coordination: Coordination normal.     ED Results / Procedures / Treatments   Labs (all labs ordered are listed, but only abnormal results are displayed) Labs Reviewed  BASIC METABOLIC PANEL - Abnormal; Notable for the following components:      Result Value   CO2 21 (*)    Glucose, Bld 103 (*)    All other components within normal limits  CBC - Abnormal; Notable for the following components:   Hemoglobin 15.9 (*)    HCT 47.2 (*)    All other components within normal limits  MAGNESIUM - Abnormal; Notable for the following  components:   Magnesium 2.5 (*)    All other components within normal limits  TSH - Abnormal; Notable for the following components:   TSH 0.024 (*)    All other components within normal limits  BRAIN NATRIURETIC PEPTIDE  TROPONIN I (HIGH SENSITIVITY)    EKG None  Radiology CT Angio Chest PE W/Cm &/Or Wo Cm  Result Date: 04/13/2022 CLINICAL DATA:  Chest pain palpitation EXAM: CT ANGIOGRAPHY CHEST WITH CONTRAST TECHNIQUE: Multidetector CT imaging of the chest was performed using the standard protocol during bolus administration of intravenous contrast. Multiplanar CT image reconstructions and MIPs were obtained to evaluate the vascular anatomy. RADIATION DOSE REDUCTION: This exam was performed according to the departmental dose-optimization program which includes automated exposure control, adjustment of the mA and/or kV according to patient size and/or use of iterative reconstruction technique. CONTRAST:  31mL OMNIPAQUE IOHEXOL 350 MG/ML SOLN COMPARISON:  Chest x-ray 04/13/2022 FINDINGS: Cardiovascular: Satisfactory opacification of the pulmonary arteries to the segmental level. No evidence of pulmonary embolism. Nonaneurysmal aorta. Mild atherosclerosis. No dissection is seen. Normal cardiac size. No pericardial effusion Mediastinum/Nodes: No enlarged mediastinal, hilar, or axillary lymph nodes. Thyroid gland, trachea, and esophagus demonstrate no significant findings. Lungs/Pleura: Lungs are clear. No pleural effusion or pneumothorax. Upper Abdomen: No acute abnormality. Right kidney stone. Gallstone incompletely visualized. Musculoskeletal: No chest wall abnormality. No acute or significant osseous findings. Review of the MIP images confirms the above findings. IMPRESSION: 1. Negative for acute pulmonary embolus or aortic dissection. Clear lung fields. 2. Gallstone. Right kidney stone. Aortic Atherosclerosis (ICD10-I70.0). Electronically Signed   By: Jasmine Pang M.D.   On: 04/13/2022 20:04   DG  Chest 2 View  Result Date: 04/13/2022 CLINICAL DATA:  Pleuritic chest pain starting at 10 a.m. EXAM: CHEST - 2 VIEW COMPARISON:  12/26/2019 FINDINGS: The lungs appear clear. Cardiac and mediastinal contours normal. No pleural effusion identified. No significant bony abnormality identified. IMPRESSION: 1. No active cardiopulmonary disease is radiographically apparent. Electronically Signed   By: Gaylyn Rong M.D.   On: 04/13/2022 14:48    Procedures Procedures    Medications Ordered in ED Medications  iohexol (OMNIPAQUE) 350 MG/ML injection 75 mL (75 mLs Intravenous Contrast Given 04/13/22 1950)    ED Course/ Medical Decision Making/ A&P                           Medical Decision Making Amount and/or Complexity of Data Reviewed Labs: ordered.  Radiology: ordered.  Risk Prescription drug management.   Patient presents due to chest pain.  Differential includes but limited to ACS, PE, pneumonia, dissection, metabolic derangement, thyroid dysfunction,, arrhythmia.  I ordered, viewed and interpreted laboratory work-up. CBC without leukocytosis or anemia. Negative delta troponin. BMP within normal limits, not consistent with a new onset of CHF.  Magnesium mildly elevated at 2.5. BMP unremarkable without AKI or gross electrolyte derangement. TSH is decreased but not contributable.  EKG shows sinus rhythm, patient is in sinus rhythm on the monitor.  I ordered and viewed imaging studies. Chest x-ray is negative for any acute process. CT angio chest is negative for PE or acute process.  Agree with radiologist.  I reevaluate patient with entirely chest pain-free at this point.  I considered admission but given reassuring work-up, negative delta troponin I do not think is ACS.  No signs of PE, pneumonia, electrolyte abnormality or underlying arrhythmia.  Given this I think she is appropriate for close outpatient follow-up with cardiology.  I discussed return precautions with the  patient also put in ambulatory referral for cardiology.        Final Clinical Impression(s) / ED Diagnoses Final diagnoses:  None    Rx / DC Orders ED Discharge Orders     None         Theron Arista, Cordelia Poche 04/13/22 2115    Glendora Score, MD 04/15/22 [phone]

## 2022-04-13 NOTE — ED Triage Notes (Signed)
CP on inspiration. Started around 1000am.  Given 4 81 aspirin VS EMS  Hr 107 Bp 112/78

## 2022-04-13 NOTE — ED Notes (Signed)
See triage notes. Pt c/o pain to chest this am before going to work. Came home early. Sister states found pt in room in fetal position and was mumbling and slurring her speech. Pt states she does not remember getting home or the wait for ems to get there. Pt states she feels a lot better now.  Pt states all this has been going on for weeks now intermittent and feels like her heart races and sob with activity. Pt slightly pale. Non diaphoretic.

## 2022-04-13 NOTE — Discharge Instructions (Signed)
You were seen today in the emergency department due to chest pain.  Your work-up today was very reassuring, no signs of blood clot, heart attack, emergent process.  I do think you will need to follow-up with a cardiologist, referral was placed to they should call you and schedule point within 72 hours.  If you do not hear from them please call and schedule an appointment yourself.  Return to the ED if the chest pain returns, becomes persistent, you have new or concerning symptoms.

## 2022-04-13 NOTE — ED Notes (Signed)
Report given to [place], paramedic

## 2022-04-27 ENCOUNTER — Ambulatory Visit: Payer: 59 | Admitting: Internal Medicine

## 2022-05-18 ENCOUNTER — Ambulatory Visit: Payer: 59 | Attending: Internal Medicine | Admitting: Internal Medicine

## 2022-05-18 ENCOUNTER — Other Ambulatory Visit: Payer: Self-pay | Admitting: Internal Medicine

## 2022-05-18 ENCOUNTER — Encounter: Payer: Self-pay | Admitting: *Deleted

## 2022-05-18 ENCOUNTER — Encounter: Payer: Self-pay | Admitting: Internal Medicine

## 2022-05-18 ENCOUNTER — Ambulatory Visit (INDEPENDENT_AMBULATORY_CARE_PROVIDER_SITE_OTHER): Payer: 59

## 2022-05-18 VITALS — BP 118/80 | HR 90 | Ht 69.0 in | Wt 128.8 lb

## 2022-05-18 DIAGNOSIS — R002 Palpitations: Secondary | ICD-10-CM

## 2022-05-18 DIAGNOSIS — R42 Dizziness and giddiness: Secondary | ICD-10-CM

## 2022-05-18 DIAGNOSIS — R079 Chest pain, unspecified: Secondary | ICD-10-CM | POA: Diagnosis not present

## 2022-05-18 DIAGNOSIS — I739 Peripheral vascular disease, unspecified: Secondary | ICD-10-CM

## 2022-05-18 DIAGNOSIS — Z8679 Personal history of other diseases of the circulatory system: Secondary | ICD-10-CM

## 2022-05-18 MED ORDER — NITROGLYCERIN 0.4 MG SL SUBL: 0.4000 mg | SUBLINGUAL_TABLET | SUBLINGUAL | 1 refills | Status: AC | PRN

## 2022-05-18 NOTE — Progress Notes (Signed)
Cardiology Office Note  Date: 05/18/2022   ID: Angelica Stone, DOB 1956-01-05, MRN [id number]  PCP:  Lewis Moccasin, MD  Cardiologist:  Marjo Bicker, MD Electrophysiologist:  None   Reason for Office Visit: Chest pain evaluation at the request of Doree Fudge, PA-C   History of Present Illness: Angelica Stone is a 66 y.o. female known to have MVP, anxiety was referred to cardiology clinic for evaluation of chest pain post ER visit.  Patient had ER visit on 04/13/2022 for chest pain evaluation associated with dizziness and palpitations. EKG, troponins and labs were within normal limits. She was eventually discharged from the ER with cardiology outpatient follow-up. She has been having substernal chest pain, nonradiating, 2-3 times per week, occurs with exertion, worsened with deep breath and resolves spontaneously after few minutes.  Associated with dizziness and palpitations. Sometimes, palpitations occur isolated and last for a few minutes. Frequency is 2-3 times per week.  She will also was having cramps in her lower extremities when she walks and had to stop walking to feel better.  Denied any DOE, LE swelling or syncope. 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  Denies smoking cigarettes, alcohol use or illicit drug abuse.  She quit smoking 6 years ago.  She has a family history of premature ASCVD (mother had triple-vessel bypass at 49 and father had triple-vessel bypass in his 78s).   Past Medical History:  Diagnosis Date   Anxiety    Arthritis    Collagenous colitis    Depression    Gallstone    History of kidney stones    Hypothyroidism    Migraines    MVP (mitral valve prolapse)    early 20's   Pneumonia    Swelling of both ankles    Varicose veins of left leg with edema     Past Surgical History:  Procedure Laterality Date   BARTHOLIN GLAND CYST EXCISION Bilateral    CAPSULAR RELEASE Right 03/30/2020   Procedure: RIGHT SHOULDER CAPSULAR RELEASE AND  MANIPULATION UNDER ANESTHESIA , INJECTION;  Surgeon: Jones Broom, MD;  Location: [facility];  Service: Orthopedics;  Laterality: Right;   COLONOSCOPY     HIP RESURFACING Left 2018   SALPINGECTOMY Left    TOTAL ABDOMINAL HYSTERECTOMY  2006   TOTAL HIP REVISION Left 12/30/2019   Procedure: LEFT TOTAL HIP REVISION;  Surgeon: Gean Birchwood, MD;  Location: [facility];  Service: Orthopedics;  Laterality: Left;   UPPER GI ENDOSCOPY      Current Outpatient Medications  Medication Sig Dispense Refill   ALPRAZolam (XANAX) 0.25 MG tablet Take 0.25-0.75 mg by mouth at bedtime.     budesonide (ENTOCORT EC) 3 MG 24 hr capsule Take 2 capsules alternating with 1 capsule x 1 month (Patient taking differently: Take 3 mg by mouth 2 (two) times a week. Take 2 capsules alternating with 1 capsule x 1 month) 60 capsule 6   Cholecalciferol (VITAMIN D3) 125 MCG (5000 UT) CAPS Take 5,000 Units by mouth daily.      citalopram (CELEXA) 40 MG tablet Take 40 mg by mouth daily.     cyanocobalamin 2000 MCG tablet Take 2,000 mcg by mouth daily.     Galcanezumab-gnlm (EMGALITY, 300 MG DOSE,) 100 MG/ML SOSY Inject 300 mg into the skin every 30 (thirty) days.     levothyroxine (SYNTHROID) 150 MCG tablet Take 150 mcg by mouth every morning.     Multiple Vitamin (MULTIVITAMIN) tablet Take 1 tablet  by mouth daily.     nitroGLYCERIN (NITROSTAT) 0.4 MG SL tablet Place 1 tablet (0.4 mg total) under the tongue every 5 (five) minutes x 3 doses as needed for chest pain (if no relief after 3rd dose, proceed to ED or call 911). 25 tablet 1   ZOLMitriptan (ZOMIG) 2.5 MG tablet Take 2.5 mg by mouth once. May repeat in 2 hours if headache persists or recurs.     No current facility-administered medications for this visit.   Allergies:  Sulfa antibiotics   Social History: The patient  reports that she quit smoking about 6 years ago. Her smoking use included cigarettes. She has never used smokeless tobacco. She reports current  alcohol use. She reports that she does not use drugs.   Family History: The patient's family history includes Arthritis in her father, mother, sister, sister, and sister; Bladder Cancer in her father; Dementia in her maternal aunt, maternal grandmother, and mother; Diabetes in her father, paternal grandfather, sister, and sister; Heart disease in her father, maternal grandfather, and mother.   ROS:  Please see the history of present illness. Otherwise, complete review of systems is positive for none.  All other systems are reviewed and negative.   Physical Exam: VS:  BP 118/80   Pulse 90   Ht 5\' 9"  (1.753 m)   Wt 128 lb 12.8 oz (58.4 kg)   SpO2 98%   BMI 19.02 kg/m , BMI Body mass index is 19.02 kg/m.  Wt Readings from Last 3 Encounters:  05/18/22 128 lb 12.8 oz (58.4 kg)  04/13/22 125 lb (56.7 kg)  03/30/20 116 lb 13.5 oz (53 kg)    General: Patient appears comfortable at rest. HEENT: Conjunctiva and lids normal, oropharynx clear with moist mucosa. Neck: Supple, no elevated JVP or carotid bruits, no thyromegaly. Lungs: Clear to auscultation, nonlabored breathing at rest. Cardiac: Regular rate and rhythm, no S3 or significant systolic murmur, no pericardial rub. Abdomen: Soft, nontender, no hepatomegaly, bowel sounds present, no guarding or rebound. Extremities: No pitting edema, distal pulses 2+. Skin: Warm and dry. Musculoskeletal: No kyphosis. Neuropsychiatric: Alert and oriented x3, affect grossly appropriate.  ECG: Normal sinus rhythm and no ST-T changes  Recent Labwork: 04/13/2022: B Natriuretic Peptide 16.0; BUN 22; Creatinine, Ser 0.88; Hemoglobin 15.9; Magnesium 2.5; Platelets 310; Potassium 4.6; Sodium 138; TSH 0.024  No results found for: "CHOL", "TRIG", "HDL", "CHOLHDL", "VLDL", "LDLCALC", "LDLDIRECT"  Other Studies Reviewed Today:   Assessment and Plan:  Patient is a 66 year old F known to have MVP, anxiety was referred to cardiology clinic for evaluation of  chest pain  # Possibly cardiac chest pain -Obtain exercise Myoview -SL NTG 0.4 mg as needed  # Palpitations # Dizziness -Obtain 1 week event monitor to rule out malignant atrial/ventricular arrhythmias and any conduction abnormalities.  # Bilateral lower EXTR claudication (Distal peripheral pulses palpable) -Obtain USG arterial lower extremities with ABI  # History of mitral valve prolapse -Obtain 2D echocardiogram  I have spent a total of 44 minutes with patient reviewing chart, EKGs, labs and examining patient as well as establishing an assessment and plan that was discussed with the patient.  > 50% of time was spent in direct patient care.     Medication Adjustments/Labs and Tests Ordered: Current medicines are reviewed at length with the patient today.  Concerns regarding medicines are outlined above.   Tests Ordered: Orders Placed This Encounter  Procedures   NM Myocar Multi W/Spect W/Wall Motion / EF   ECHOCARDIOGRAM COMPLETE  VAS [place] LOWER EXT ART SEG MULTI (SEGMENTALS & LE RAYNAUDS)    Medication Changes: Meds ordered this encounter  Medications   nitroGLYCERIN (NITROSTAT) 0.4 MG SL tablet    Sig: Place 1 tablet (0.4 mg total) under the tongue every 5 (five) minutes x 3 doses as needed for chest pain (if no relief after 3rd dose, proceed to ED or call 911).    Dispense:  25 tablet    Refill:  1    Disposition:  Follow up  6 months  Signed Sian Rockers Verne Spurr, MD, 05/18/2022 12:09 PM    [facility] at [facility] [address] [place], [place], [place] [address]

## 2022-05-18 NOTE — Patient Instructions (Addendum)
Medication Instructions:  Your physician has recommended you make the following change in your medication:  Start nitroglycerin 0.4 mg. Dissolve one under tongue for chest pain every 5 minutes up to 3 doses. If no relief, proceed to ED.(Do not take if you are dizzy) Continue other medications the same  Labwork: none  Testing/Procedures: Your physician has requested that you have an echocardiogram. Echocardiography is a painless test that uses sound waves to create images of your heart. It provides your doctor with information about the size and shape of your heart and how well your heart's chambers and valves are working. This procedure takes approximately one hour. There are no restrictions for this procedure. Please do NOT wear cologne, perfume, aftershave, or lotions (deodorant is allowed). Please arrive 15 minutes prior to your appointment time. Your physician has requested that you have a lexiscan myoview. For further information please visit [email or web address] Please follow instruction sheet, as given. Your physician has requested that you have a lower extremity arterial exercise duplex. During this test, exercise and ultrasound are used to evaluate arterial blood flow in the legs. Allow one hour for this exam. There are no restrictions or special instructions. Your physician has recommended that you wear a Zio monitor.   This monitor is a medical device that records the heart's electrical activity. Doctors most often use these monitors to diagnose arrhythmias. Arrhythmias are problems with the speed or rhythm of the heartbeat. The monitor is a small device applied to your chest. You can wear one while you do your normal daily activities. While wearing this monitor if you have any symptoms to push the button and record what you felt. Once you have worn this monitor for the period of time provider prescribed (for 7 days), you will return the monitor device in the postage paid box. Once it is  returned they will download the data collected and provide [place] with a report which the provider will then review and we will call you with those results. Important tips:  Avoid showering during the first 24 hours of wearing the monitor. Avoid excessive sweating to help maximize wear time. Do not submerge the device, no hot tubs, and no swimming pools. Keep any lotions or oils away from the patch. After 24 hours you may shower with the patch on. Take brief showers with your back facing the shower head.  Do not remove patch once it has been placed because that will interrupt data and decrease adhesive wear time. Push the button when you have any symptoms and write down what you were feeling. Once you have completed wearing your monitor, remove and place into box which has postage paid and place in your outgoing mailbox.  If for some reason you have misplaced your box then call our office and we can provide another box and/or mail it off for you.  Follow-Up: Your physician recommends that you schedule a follow-up appointment in: 6 months  Any Other Special Instructions Will Be Listed Below (If Applicable).  If you need a refill on your cardiac medications before your next appointment, please call your pharmacy.

## 2022-05-24 DIAGNOSIS — E782 Mixed hyperlipidemia: Secondary | ICD-10-CM | POA: Diagnosis not present

## 2022-05-24 DIAGNOSIS — E039 Hypothyroidism, unspecified: Secondary | ICD-10-CM | POA: Diagnosis not present

## 2022-05-24 DIAGNOSIS — R079 Chest pain, unspecified: Secondary | ICD-10-CM | POA: Diagnosis not present

## 2022-05-24 DIAGNOSIS — R000 Tachycardia, unspecified: Secondary | ICD-10-CM | POA: Diagnosis not present

## 2022-05-25 ENCOUNTER — Other Ambulatory Visit: Payer: Self-pay | Admitting: Internal Medicine

## 2022-05-25 DIAGNOSIS — I739 Peripheral vascular disease, unspecified: Secondary | ICD-10-CM

## 2022-05-27 ENCOUNTER — Encounter (HOSPITAL_COMMUNITY)
Admission: RE | Admit: 2022-05-27 | Discharge: 2022-05-27 | Disposition: A | Discharge status: Home / Self Care | Payer: 59 | Source: Ambulatory Visit | Attending: Internal Medicine | Admitting: Internal Medicine

## 2022-05-27 ENCOUNTER — Ambulatory Visit (HOSPITAL_COMMUNITY)
Admission: RE | Admit: 2022-05-27 | Discharge: 2022-05-27 | Disposition: A | Discharge status: Home / Self Care | Payer: 59 | Source: Ambulatory Visit | Attending: Internal Medicine | Admitting: Internal Medicine

## 2022-05-27 DIAGNOSIS — R079 Chest pain, unspecified: Secondary | ICD-10-CM | POA: Insufficient documentation

## 2022-05-27 LAB — NM MYOCAR MULTI W/SPECT W/WALL MOTION / EF
Angina Index: 1
Duke Treadmill Score: 4
Estimated workload: 5.6
Exercise duration (min): 7 min
Exercise duration (sec): 37 s
LV dias vol: 71 mL (ref 46–106)
LV sys vol: 23 mL
MPHR: 156 {beats}/min
Nuc Stress EF: 67 %
Peak HR: 134 {beats}/min
Percent HR: 85 %
RATE: 0.4
RPE: 14
Rest HR: 83 {beats}/min
Rest Nuclear Isotope Dose: 10.3 mCi
SDS: 1
SRS: 1
SSS: 2
ST Depression (mm): 0 mm
Stress Nuclear Isotope Dose: 27 mCi
TID: 1.2

## 2022-05-27 MED ORDER — TECHNETIUM TC 99M TETROFOSMIN IV KIT
30.0000 | PACK | Freq: Once | INTRAVENOUS | Status: AC | PRN
  Administered 2022-05-27: 27 via INTRAVENOUS

## 2022-05-27 MED ORDER — SODIUM CHLORIDE FLUSH 0.9 % IV SOLN
INTRAVENOUS | Status: AC
  Filled 2022-05-27: qty 10

## 2022-05-27 MED ORDER — TECHNETIUM TC 99M TETROFOSMIN IV KIT
10.0000 | PACK | Freq: Once | INTRAVENOUS | Status: AC | PRN
  Administered 2022-05-27: 10.3 via INTRAVENOUS

## 2022-05-27 MED ORDER — REGADENOSON 0.4 MG/5ML IV SOLN
INTRAVENOUS | Status: AC
  Filled 2022-05-27: qty 5

## 2022-05-31 ENCOUNTER — Telehealth: Payer: Self-pay

## 2022-05-31 DIAGNOSIS — Z0000 Encounter for general adult medical examination without abnormal findings: Secondary | ICD-10-CM | POA: Diagnosis not present

## 2022-05-31 DIAGNOSIS — R2230 Localized swelling, mass and lump, unspecified upper limb: Secondary | ICD-10-CM | POA: Diagnosis not present

## 2022-05-31 DIAGNOSIS — L989 Disorder of the skin and subcutaneous tissue, unspecified: Secondary | ICD-10-CM | POA: Diagnosis not present

## 2022-05-31 NOTE — Telephone Encounter (Signed)
-----   Message from Marjo Bicker, MD sent at 05/31/2022  9:47 AM EST ----- No evidence of ischemia. Normal stress test.

## 2022-05-31 NOTE — Telephone Encounter (Signed)
Patient notified and verbalized understanding. Patient had no questions or concerns at this time. PCP copied 

## 2022-06-01 ENCOUNTER — Other Ambulatory Visit: Payer: Self-pay | Admitting: Physician Assistant

## 2022-06-01 ENCOUNTER — Other Ambulatory Visit: Payer: Self-pay | Admitting: Family Medicine

## 2022-06-01 ENCOUNTER — Other Ambulatory Visit: Payer: Self-pay | Admitting: Internal Medicine

## 2022-06-01 DIAGNOSIS — I739 Peripheral vascular disease, unspecified: Secondary | ICD-10-CM

## 2022-06-01 DIAGNOSIS — R2232 Localized swelling, mass and lump, left upper limb: Secondary | ICD-10-CM

## 2022-06-01 DIAGNOSIS — Z1231 Encounter for screening mammogram for malignant neoplasm of breast: Secondary | ICD-10-CM

## 2022-06-01 DIAGNOSIS — R002 Palpitations: Secondary | ICD-10-CM | POA: Diagnosis not present

## 2022-06-07 ENCOUNTER — Ambulatory Visit (INDEPENDENT_AMBULATORY_CARE_PROVIDER_SITE_OTHER): Payer: 59

## 2022-06-07 ENCOUNTER — Ambulatory Visit: Payer: 59 | Attending: Internal Medicine

## 2022-06-07 DIAGNOSIS — R079 Chest pain, unspecified: Secondary | ICD-10-CM

## 2022-06-07 DIAGNOSIS — I739 Peripheral vascular disease, unspecified: Secondary | ICD-10-CM | POA: Diagnosis not present

## 2022-06-07 LAB — ECHOCARDIOGRAM COMPLETE
AR max vel: 2.36 cm2
AV Peak grad: 2 mmHg
Ao pk vel: 0.71 m/s
Area-P 1/2: 3.99 cm2
Calc EF: 58.2 %
S' Lateral: 3.1 cm
Single Plane A2C EF: 60.8 %
Single Plane A4C EF: 55.7 %

## 2022-06-08 ENCOUNTER — Other Ambulatory Visit: Payer: Self-pay | Admitting: Family Medicine

## 2022-06-08 DIAGNOSIS — N644 Mastodynia: Secondary | ICD-10-CM

## 2022-06-08 DIAGNOSIS — N6320 Unspecified lump in the left breast, unspecified quadrant: Secondary | ICD-10-CM

## 2022-06-23 ENCOUNTER — Ambulatory Visit
Admission: RE | Admit: 2022-06-23 | Discharge: 2022-06-23 | Disposition: A | Discharge status: Home / Self Care | Payer: 59 | Source: Ambulatory Visit | Attending: Family Medicine | Admitting: Family Medicine

## 2022-06-23 DIAGNOSIS — N644 Mastodynia: Secondary | ICD-10-CM

## 2022-06-23 DIAGNOSIS — N6320 Unspecified lump in the left breast, unspecified quadrant: Secondary | ICD-10-CM

## 2022-06-23 DIAGNOSIS — R922 Inconclusive mammogram: Secondary | ICD-10-CM | POA: Diagnosis not present

## 2022-06-23 DIAGNOSIS — N6489 Other specified disorders of breast: Secondary | ICD-10-CM | POA: Diagnosis not present

## 2022-06-29 ENCOUNTER — Other Ambulatory Visit (HOSPITAL_COMMUNITY): Payer: Self-pay | Admitting: Family Medicine

## 2022-06-29 DIAGNOSIS — R2232 Localized swelling, mass and lump, left upper limb: Secondary | ICD-10-CM

## 2022-07-01 NOTE — Progress Notes (Signed)
Mir, Paula Libra, MD  Donita Brooks D Approved for US guided aspiration/biopsy of left axillary mass.  Mir

## 2022-07-01 NOTE — Progress Notes (Signed)
Markus Daft, MD  Tamala Bari for US guided biopsy.  [place]enn

## 2022-07-11 ENCOUNTER — Other Ambulatory Visit: Payer: Self-pay | Admitting: Student

## 2022-07-11 DIAGNOSIS — R2232 Localized swelling, mass and lump, left upper limb: Secondary | ICD-10-CM

## 2022-07-12 ENCOUNTER — Encounter (HOSPITAL_COMMUNITY): Payer: Self-pay

## 2022-07-12 ENCOUNTER — Ambulatory Visit (HOSPITAL_COMMUNITY)
Admission: RE | Admit: 2022-07-12 | Discharge: 2022-07-12 | Disposition: A | Discharge status: Home / Self Care | Payer: 59 | Source: Ambulatory Visit | Attending: Family Medicine | Admitting: Family Medicine

## 2022-07-12 DIAGNOSIS — R2232 Localized swelling, mass and lump, left upper limb: Secondary | ICD-10-CM | POA: Diagnosis not present

## 2022-07-12 DIAGNOSIS — M7989 Other specified soft tissue disorders: Secondary | ICD-10-CM | POA: Diagnosis not present

## 2022-07-12 DIAGNOSIS — R222 Localized swelling, mass and lump, trunk: Secondary | ICD-10-CM | POA: Insufficient documentation

## 2022-07-12 MED ORDER — SODIUM CHLORIDE 0.9 % IV SOLN: INTRAVENOUS | Status: DC

## 2022-07-12 MED ORDER — FENTANYL CITRATE (PF) 100 MCG/2ML IJ SOLN
INTRAMUSCULAR | Status: AC
  Filled 2022-07-12: qty 2

## 2022-07-12 MED ORDER — MIDAZOLAM HCL 2 MG/2ML IJ SOLN
INTRAMUSCULAR | Status: AC | PRN
  Administered 2022-07-12: 1 mg via INTRAVENOUS

## 2022-07-12 MED ORDER — LIDOCAINE HCL (PF) 1 % IJ SOLN
8.0000 mL | Freq: Once | INTRAMUSCULAR | Status: AC
  Administered 2022-07-12: 8 mL via INTRADERMAL

## 2022-07-12 MED ORDER — FENTANYL CITRATE (PF) 100 MCG/2ML IJ SOLN
INTRAMUSCULAR | Status: AC | PRN
  Administered 2022-07-12: 25 ug via INTRAVENOUS

## 2022-07-12 MED ORDER — MIDAZOLAM HCL 2 MG/2ML IJ SOLN
INTRAMUSCULAR | Status: AC
  Filled 2022-07-12: qty 2

## 2022-07-12 NOTE — H&P (Signed)
Chief Complaint: Patient was seen in consultation today for left axillary mass   Referring Physician(s): Dewey,Elizabeth R  Supervising Physician: Aletta Edouard  Patient Status: [facility] - Out-pt  History of Present Illness: Angelica Stone is a 67 y.o. female being worked up for left axillary mass. She is referred for image guided biopsy. PMHx, meds, labs, imaging, allergies reviewed. Feels well, no recent fevers, chills, illness. Has been NPO today as directed.   Past Medical History:  Diagnosis Date   Anxiety    Arthritis    Collagenous colitis    Depression    Gallstone    History of kidney stones    Hypothyroidism    Migraines    MVP (mitral valve prolapse)    early 20's   Pneumonia    Swelling of both ankles    Varicose veins of left leg with edema     Past Surgical History:  Procedure Laterality Date   BARTHOLIN GLAND CYST EXCISION Bilateral    CAPSULAR RELEASE Right 03/30/2020   Procedure: RIGHT SHOULDER CAPSULAR RELEASE AND MANIPULATION UNDER ANESTHESIA , INJECTION;  Surgeon: Tania Ade, MD;  Location: [facility];  Service: Orthopedics;  Laterality: Right;   COLONOSCOPY     HIP RESURFACING Left 2018   SALPINGECTOMY Left    TOTAL ABDOMINAL HYSTERECTOMY  2006   TOTAL HIP REVISION Left 12/30/2019   Procedure: LEFT TOTAL HIP REVISION;  Surgeon: Frederik Pear, MD;  Location: [facility];  Service: Orthopedics;  Laterality: Left;   UPPER GI ENDOSCOPY      Allergies: Sulfa antibiotics  Medications: Prior to Admission medications   Medication Sig Start Date End Date Taking? Authorizing Provider  ALPRAZolam (XANAX) 0.25 MG tablet Take 0.25-0.75 mg by mouth at bedtime. 05/14/22  Yes [provider]  budesonide (ENTOCORT EC) 3 MG 24 hr capsule Take 2 capsules alternating with 1 capsule x 1 month Patient taking differently: Take 3 mg by mouth 2 (two) times a week. Take 2 capsules alternating with 1 capsule x 1 month 11/08/19  Yes Milus Banister, MD  Cholecalciferol (VITAMIN D3) 125 MCG (5000 UT) CAPS Take 5,000 Units by mouth daily.    Yes [provider]  citalopram (CELEXA) 40 MG tablet Take 40 mg by mouth daily.   Yes [provider]  cyanocobalamin 2000 MCG tablet Take 2,000 mcg by mouth daily.   Yes [provider]  Galcanezumab-gnlm (EMGALITY, 300 MG DOSE,) 100 MG/ML SOSY Inject 300 mg into the skin every 30 (thirty) days.   Yes [provider]  levothyroxine (SYNTHROID) 150 MCG tablet Take 150 mcg by mouth every morning. 05/14/22  Yes [provider]  Multiple Vitamin (MULTIVITAMIN) tablet Take 1 tablet by mouth daily.   Yes [provider]  ZOLMitriptan (ZOMIG) 2.5 MG tablet Take 2.5 mg by mouth once. May repeat in 2 hours if headache persists or recurs.   Yes [provider]  nitroGLYCERIN (NITROSTAT) 0.4 MG SL tablet Place 1 tablet (0.4 mg total) under the tongue every 5 (five) minutes x 3 doses as needed for chest pain (if no relief after 3rd dose, proceed to ED or call 911). 05/18/22   Mallipeddi, Quenten Raven, MD     Family History  Problem Relation Age of Onset   Heart disease Mother    Dementia Mother    Arthritis Mother    Bladder Cancer Father    Diabetes Father    Heart disease Father    Arthritis Father  Diabetes Sister    Arthritis Sister    Dementia Maternal Grandmother    Heart disease Maternal Grandfather    Diabetes Paternal Grandfather    Diabetes Sister    Arthritis Sister    Arthritis Sister    Dementia Maternal Aunt        x 2    Social History   Socioeconomic History   Marital status: Single    Spouse name: Not on file   Number of children: 0   Years of education: Not on file   Highest education level: Not on file  Occupational History   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  Tobacco Use   Smoking status: Former    Types: Cigarettes    Quit date: 02/20/2016    Years since quitting: 6.3   Smokeless tobacco: Never  Vaping  Use   Vaping Use: Never used  Substance and Sexual Activity   Alcohol use: Not Currently    Comment: rarely on occasions   Drug use: Never   Sexual activity: Not on file  Other Topics Concern   Not on file  Social History Narrative   Not on file   Social Determinants of Health   Financial Resource Strain: Not on file  Food Insecurity: Not on file  Transportation Needs: Not on file  Physical Activity: Not on file  Stress: Not on file  Social Connections: Not on file    Review of Systems: A 12 point ROS discussed and pertinent positives are indicated in the HPI above.  All other systems are negative.  Review of Systems  Vital Signs: Pulse 77   Temp 97.9 F (36.6 C) (Oral)   Resp 16   Ht 5\' 9"  (1.753 m)   Wt 121 lb (54.9 kg)   SpO2 100%   BMI 17.87 kg/m   Physical Exam Constitutional:      Appearance: Normal appearance. She is not ill-appearing.  HENT:     Mouth/Throat:     Mouth: Mucous membranes are moist.     Pharynx: Oropharynx is clear.  Cardiovascular:     Rate and Rhythm: Normal rate and regular rhythm.     Heart sounds: Normal heart sounds.  Pulmonary:     Effort: Pulmonary effort is normal. No respiratory distress.     Breath sounds: Normal breath sounds.  Musculoskeletal:     Comments: Firm palpable nodule left posterior axillary region.  Firm palpable nodule left medial elbow region  No overlying erythema or skin changes.  Skin:    General: Skin is warm and dry.  Neurological:     General: No focal deficit present.     Mental Status: She is alert and oriented to person, place, and time.  Psychiatric:        Mood and Affect: Mood normal.        Thought Content: Thought content normal.     Imaging: MM DIAG BREAST TOMO BILATERAL  Result Date: 06/23/2022 CLINICAL DATA:  67 year old female with a palpable lump in the left axilla/proximal arm. EXAM: DIGITAL DIAGNOSTIC BILATERAL MAMMOGRAM WITH TOMOSYNTHESIS; [place] AXILLARY LEFT TECHNIQUE: Bilateral  digital diagnostic mammography and breast tomosynthesis was performed.; Targeted ultrasound examination of the left axilla was performed. COMPARISON:  None available. ACR Breast Density Category c: The breast tissue is heterogeneously dense, which may obscure small masses. FINDINGS: No focal or suspicious mammographic findings are identified in either breast. On physical exam, I palpate a smooth, round 2-3 cm mobile mass in the posterior proximal left arm. Targeted ultrasound  is performed, showing an oval, circumscribed hypoechoic mass with slightly indistinct margins along the posterior proximal left arm. It measures 2.2 x 1.6 x 1.3 cm without internal vascularity. Evaluation of the left axilla demonstrates no suspicious lymphadenopathy. IMPRESSION: 1. Indeterminate 2.2 cm mass in the proximal left arm. 2. No mammographic evidence of malignancy in either breast. 3. No suspicious left axillary lymphadenopathy. RECOMMENDATION: 1. Given the location of the patient's palpable mass, recommendation is for biopsy via interventional radiology. 2. Routine bilateral mammography in 1 year. I have discussed the findings and recommendations with the patient. If applicable, a reminder letter will be sent to the patient regarding the next appointment. BI-RADS CATEGORY  4: Suspicious. Electronically Signed   By: Kristopher Oppenheim M.D.   On: 06/23/2022 09:50  [place] AXILLA LEFT  Result Date: 06/23/2022 CLINICAL DATA:  67 year old female with a palpable lump in the left axilla/proximal arm. EXAM: DIGITAL DIAGNOSTIC BILATERAL MAMMOGRAM WITH TOMOSYNTHESIS; [place] AXILLARY LEFT TECHNIQUE: Bilateral digital diagnostic mammography and breast tomosynthesis was performed.; Targeted ultrasound examination of the left axilla was performed. COMPARISON:  None available. ACR Breast Density Category c: The breast tissue is heterogeneously dense, which may obscure small masses. FINDINGS: No focal or suspicious mammographic findings are identified in  either breast. On physical exam, I palpate a smooth, round 2-3 cm mobile mass in the posterior proximal left arm. Targeted ultrasound is performed, showing an oval, circumscribed hypoechoic mass with slightly indistinct margins along the posterior proximal left arm. It measures 2.2 x 1.6 x 1.3 cm without internal vascularity. Evaluation of the left axilla demonstrates no suspicious lymphadenopathy. IMPRESSION: 1. Indeterminate 2.2 cm mass in the proximal left arm. 2. No mammographic evidence of malignancy in either breast. 3. No suspicious left axillary lymphadenopathy. RECOMMENDATION: 1. Given the location of the patient's palpable mass, recommendation is for biopsy via interventional radiology. 2. Routine bilateral mammography in 1 year. I have discussed the findings and recommendations with the patient. If applicable, a reminder letter will be sent to the patient regarding the next appointment. BI-RADS CATEGORY  4: Suspicious. Electronically Signed   By: Kristopher Oppenheim M.D.   On: 06/23/2022 09:50   Labs:  CBC: Recent Labs    04/13/22 1511  WBC 8.5  HGB 15.9*  HCT 47.2*  PLT 310    COAGS: No results for input(s): "INR", "APTT" in the last 8760 hours.  BMP: Recent Labs    04/13/22 1511  NA 138  K 4.6  CL 103  CO2 21*  GLUCOSE 103*  BUN 22  CALCIUM 10.2  CREATININE 0.88  GFRNONAA >60     Assessment and Plan: Left axillary mass For US guided biopsy Labs reviewed. Risks and benefits of left axillary mass biopsy was discussed with the patient and/or patient's family including, but not limited to bleeding, infection, damage to adjacent structures or low yield requiring additional tests.  All of the questions were answered and there is agreement to proceed.  Consent signed and in chart.    Electronically Signed: Ascencion Dike, PA-C 07/12/2022, 7:32 AM   I spent a total of 20 minutes in face to face in clinical consultation, greater than 50% of which was  counseling/coordinating care for left axillary mass biopsy

## 2022-07-12 NOTE — Procedures (Signed)
Interventional Radiology Procedure Note  Procedure: US Guided Biopsy of left posterior upper arm subcu mass  Complications: None  Estimated Blood Loss: < 10 mL  Findings: 18 G core biopsy of 1.9 cm left posterior upper arm subcu mass performed under US guidance.  Multiple core samples obtained and sent to Pathology.  Venetia Night. Kathlene Cote, M.D Pager:  [phone]

## 2022-07-14 DIAGNOSIS — E559 Vitamin D deficiency, unspecified: Secondary | ICD-10-CM | POA: Diagnosis not present

## 2022-07-14 DIAGNOSIS — E782 Mixed hyperlipidemia: Secondary | ICD-10-CM | POA: Diagnosis not present

## 2022-07-14 DIAGNOSIS — E039 Hypothyroidism, unspecified: Secondary | ICD-10-CM | POA: Diagnosis not present

## 2022-07-18 LAB — SURGICAL PATHOLOGY

## 2022-07-20 DIAGNOSIS — E782 Mixed hyperlipidemia: Secondary | ICD-10-CM | POA: Diagnosis not present

## 2022-07-20 DIAGNOSIS — G43E09 Chronic migraine with aura, not intractable, without status migrainosus: Secondary | ICD-10-CM | POA: Diagnosis not present

## 2022-07-20 DIAGNOSIS — E039 Hypothyroidism, unspecified: Secondary | ICD-10-CM | POA: Diagnosis not present

## 2022-07-20 DIAGNOSIS — G43109 Migraine with aura, not intractable, without status migrainosus: Secondary | ICD-10-CM | POA: Diagnosis not present

## 2022-07-20 DIAGNOSIS — E559 Vitamin D deficiency, unspecified: Secondary | ICD-10-CM | POA: Diagnosis not present

## 2022-08-10 DIAGNOSIS — D179 Benign lipomatous neoplasm, unspecified: Secondary | ICD-10-CM | POA: Diagnosis not present

## 2022-09-20 ENCOUNTER — Other Ambulatory Visit: Payer: Self-pay | Admitting: General Surgery

## 2022-09-20 DIAGNOSIS — M7989 Other specified soft tissue disorders: Secondary | ICD-10-CM | POA: Diagnosis not present

## 2022-09-20 DIAGNOSIS — D1721 Benign lipomatous neoplasm of skin and subcutaneous tissue of right arm: Secondary | ICD-10-CM | POA: Diagnosis not present

## 2022-09-20 DIAGNOSIS — D1722 Benign lipomatous neoplasm of skin and subcutaneous tissue of left arm: Secondary | ICD-10-CM | POA: Diagnosis not present

## 2022-10-10 DIAGNOSIS — H1010 Acute atopic conjunctivitis, unspecified eye: Secondary | ICD-10-CM | POA: Diagnosis not present

## 2022-11-04 DIAGNOSIS — F419 Anxiety disorder, unspecified: Secondary | ICD-10-CM | POA: Diagnosis not present

## 2022-11-17 ENCOUNTER — Ambulatory Visit: Payer: 59 | Admitting: Internal Medicine

## 2023-04-17 ENCOUNTER — Emergency Department (HOSPITAL_COMMUNITY): Payer: Medicare Other

## 2023-04-17 ENCOUNTER — Other Ambulatory Visit: Payer: Self-pay

## 2023-04-17 ENCOUNTER — Encounter (HOSPITAL_COMMUNITY): Payer: Self-pay | Admitting: Emergency Medicine

## 2023-04-17 ENCOUNTER — Emergency Department (HOSPITAL_COMMUNITY)
Admission: EM | Admit: 2023-04-17 | Discharge: 2023-04-17 | Disposition: A | Discharge status: Home / Self Care | Payer: Medicare Other | Attending: Emergency Medicine | Admitting: Emergency Medicine

## 2023-04-17 DIAGNOSIS — K5792 Diverticulitis of intestine, part unspecified, without perforation or abscess without bleeding: Secondary | ICD-10-CM | POA: Insufficient documentation

## 2023-04-17 DIAGNOSIS — E039 Hypothyroidism, unspecified: Secondary | ICD-10-CM | POA: Insufficient documentation

## 2023-04-17 DIAGNOSIS — Z79899 Other long term (current) drug therapy: Secondary | ICD-10-CM | POA: Insufficient documentation

## 2023-04-17 DIAGNOSIS — R1032 Left lower quadrant pain: Secondary | ICD-10-CM | POA: Diagnosis present

## 2023-04-17 LAB — URINALYSIS, ROUTINE W REFLEX MICROSCOPIC
Bacteria, UA: NONE SEEN
Bilirubin Urine: NEGATIVE
Glucose, UA: NEGATIVE mg/dL
Hgb urine dipstick: NEGATIVE
Ketones, ur: NEGATIVE mg/dL
Nitrite: NEGATIVE
Protein, ur: NEGATIVE mg/dL
Specific Gravity, Urine: 1.009 (ref 1.005–1.030)
pH: 7 (ref 5.0–8.0)

## 2023-04-17 LAB — COMPREHENSIVE METABOLIC PANEL
ALT: 12 U/L (ref 0–44)
AST: 16 U/L (ref 15–41)
Albumin: 3.7 g/dL (ref 3.5–5.0)
Alkaline Phosphatase: 94 U/L (ref 38–126)
Anion gap: 11 (ref 5–15)
BUN: 12 mg/dL (ref 8–23)
CO2: 15 mmol/L — ABNORMAL LOW (ref 22–32)
Calcium: 8.9 mg/dL (ref 8.9–10.3)
Chloride: 108 mmol/L (ref 98–111)
Creatinine, Ser: 0.81 mg/dL (ref 0.44–1.00)
GFR, Estimated: >60 mL/min (ref >60)
Glucose, Bld: 94 mg/dL (ref 70–99)
Potassium: 3.7 mmol/L (ref 3.5–5.1)
Sodium: 134 mmol/L — ABNORMAL LOW (ref 135–145)
Total Bilirubin: 0.8 mg/dL (ref <1.2)
Total Protein: 7.3 g/dL (ref 6.5–8.1)

## 2023-04-17 LAB — CBC
HCT: 39.8 % (ref 36.0–46.0)
Hemoglobin: 12.9 g/dL (ref 12.0–15.0)
MCH: 31.9 pg (ref 26.0–34.0)
MCHC: 32.4 g/dL (ref 30.0–36.0)
MCV: 98.5 fL (ref 80.0–100.0)
Platelets: 241 10*3/uL (ref 150–400)
RBC: 4.04 MIL/uL (ref 3.87–5.11)
RDW: 13.1 % (ref 11.5–15.5)
WBC: 8.2 10*3/uL (ref 4.0–10.5)
nRBC: 0 % (ref 0.0–0.2)

## 2023-04-17 LAB — LIPASE, BLOOD: Lipase: 24 U/L (ref 11–51)

## 2023-04-17 MED ORDER — IOHEXOL 350 MG/ML SOLN
75.0000 mL | Freq: Once | INTRAVENOUS | Status: AC | PRN
  Administered 2023-04-17: 75 mL via INTRAVENOUS

## 2023-04-17 MED ORDER — AMOXICILLIN-POT CLAVULANATE 875-125 MG PO TABS
1.0000 | ORAL_TABLET | Freq: Once | ORAL | Status: AC
  Administered 2023-04-17: 1 via ORAL
  Filled 2023-04-17: qty 1

## 2023-04-17 MED ORDER — AMOXICILLIN-POT CLAVULANATE 875-125 MG PO TABS: 1.0000 | ORAL_TABLET | Freq: Two times a day (BID) | ORAL | 0 refills | Status: DC

## 2023-04-17 NOTE — ED Provider Notes (Signed)
[place] EMERGENCY DEPARTMENT AT [facility] Provider Note   CSN: [id number] Arrival date & time: 04/17/23  1116     History  Chief Complaint  Patient presents with   Abdominal Pain   Angelica Stone is a 67 y.o. female.  67 year old female with medical history of hypothyroidism, arthritis who presents with left lower quadrant abdominal pain since last night.  She notes history of colonoscopy approximately 5 years ago with collagenous colitis in which she was started on budesonide.  She was able to have a very small bowel movement yesterday morning but was having trouble passing flatus last night.  She has been able to pass flatus while in the ED.  Denies fevers at home.  She has not had much interest in eating but is been able to drink well.  Denies chest pain/pressure and shortness of breath.  Denies alcohol use.  She stopped smoking approximately 4 months ago.  She has not tried stool softeners.  She states it was difficult last night to sleep as it hurt just to change positions.   Abdominal Pain      Home Medications Prior to Admission medications   Medication Sig Start Date End Date Taking? Authorizing Provider  budesonide (ENTOCORT EC) 3 MG 24 hr capsule Take 2 capsules alternating with 1 capsule x 1 month Patient taking differently: Take 3 mg by mouth 2 (two) times a week. Take 2 capsules alternating with 1 capsule x 1 month 11/08/19  Yes Rachael Fee, MD  Cholecalciferol (VITAMIN D3) 125 MCG (5000 UT) CAPS Take 5,000 Units by mouth daily.    Yes [provider]  citalopram (CELEXA) 40 MG tablet Take 40 mg by mouth daily.   Yes [provider]  cyanocobalamin 2000 MCG tablet Take 2,000 mcg by mouth daily.   Yes [provider]  levothyroxine (SYNTHROID) 150 MCG tablet Take 150 mcg by mouth every morning. 05/14/22  Yes [provider]  Multiple Vitamin (MULTIVITAMIN) tablet Take 1 tablet by mouth daily.   Yes [provider]  nitroGLYCERIN (NITROSTAT) 0.4 MG SL tablet Place 1 tablet (0.4 mg total) under the tongue every 5 (five) minutes x 3 doses as needed for chest pain (if no relief after 3rd dose, proceed to ED or call 911). 05/18/22  Yes Mallipeddi, Vishnu P, MD  topiramate (TOPAMAX) 100 MG tablet Take 100 mg by mouth 2 (two) times daily.   Yes [provider]  ZOLMitriptan (ZOMIG) 2.5 MG tablet Take 2.5 mg by mouth once. May repeat in 2 hours if headache persists or recurs.   Yes [provider]      Allergies    Sulfa antibiotics    Review of Systems   Review of Systems  Gastrointestinal:  Positive for abdominal pain.    Physical Exam Updated Vital Signs BP 101/62   Pulse 72   Temp 98.4 F (36.9 C) (Oral)   Resp 14   SpO2 100%  Physical Exam Constitutional:      Appearance: She is well-developed.  Abdominal:     Tenderness: There is abdominal tenderness in the left lower quadrant.  Neurological:     Mental Status: She is alert.    ED Results / Procedures / Treatments   Labs (all labs ordered are listed, but only abnormal results are displayed) Labs Reviewed  COMPREHENSIVE METABOLIC PANEL - Abnormal; Notable for the following components:      Result Value   Sodium 134 (*)  CO2 15 (*)    All other components within normal limits  URINALYSIS, ROUTINE W REFLEX MICROSCOPIC - Abnormal; Notable for the following components:   Leukocytes,Ua SMALL (*)    All other components within normal limits  LIPASE, BLOOD  CBC   EKG None  Radiology No results found.  Procedures Procedures    Medications Ordered in ED Medications - No data to display  ED Course/ Medical Decision Making/ A&P                                 Medical Decision Making 67 year old female with medical history of hypothyroidism, total abdominal hysterectomy, salpingectomy presenting with left lower quadrant pain x 1 day in which she is having trouble with bowel movements.   Reassuringly, she has been able to pass flatus.  Further chart review reveals last colonoscopy in 2016 reflects diagnosis of collagenous colitis in which she is currently taking budesonide.  Physical exam reveals well-appearing female although she does have tender left lower quadrant and avoids movement.  Given her history of collagenous colitis, most concerning on differential diagnosis is diverticulitis.  Otherwise, most likely constipation.  CBC, CMP, lipase, UA grossly unremarkable.  Will investigate further with CT abdomen pelvis with contrast.  Signed out to oncoming team.  Awaiting CT abdomen pelvis with contrast.  Amount and/or Complexity of Data Reviewed Labs: ordered. Radiology: ordered.         Final Clinical Impression(s) / ED Diagnoses Final diagnoses:  None    Rx / DC Orders ED Discharge Orders     None         Shelby Mattocks, DO 04/17/23 1511    Benjiman Core, MD 04/24/23 1450

## 2023-04-17 NOTE — Discharge Instructions (Signed)
You may have a reticulitis.  Potentially this could be related to your collagenous colitis also.  You will be treated with antibiotics.  Follow-up with your gastroenterologist.

## 2023-04-17 NOTE — ED Provider Notes (Signed)
  Physical Exam  BP 102/67   Pulse 70   Temp 98.3 F (36.8 C) (Oral)   Resp 17   SpO2 100%   Physical Exam  Procedures  Procedures  ED Course / MDM    Medical Decision Making Amount and/or Complexity of Data Reviewed Labs: ordered. Radiology: ordered.  Risk Prescription drug management.   Received in signout.  Abdominal pain.  CT scan shows likely diverticulitis.  Uncomplicated.  Well-appearing.  Will discharge home with antibiotics.  Reviewing notes has had previous collagenous colitis.  Can follow-up with her gastroenterologist but will just treat with antibiotics this time.  Will discharge home.       Benjiman Core, MD 04/17/23 [phone]

## 2023-04-17 NOTE — ED Triage Notes (Signed)
Pt here from home with c/o left lower abd pain , some nausea no vomiting and some sob

## 2023-04-18 ENCOUNTER — Encounter: Payer: Self-pay | Admitting: Gastroenterology

## 2023-05-10 ENCOUNTER — Ambulatory Visit: Payer: Medicare Other | Admitting: Gastroenterology

## 2023-07-21 ENCOUNTER — Other Ambulatory Visit (HOSPITAL_COMMUNITY): Payer: Self-pay

## 2023-07-21 ENCOUNTER — Telehealth: Payer: Self-pay | Admitting: Pharmacy Technician

## 2023-07-21 ENCOUNTER — Ambulatory Visit: Payer: Medicare Other | Admitting: Gastroenterology

## 2023-07-21 ENCOUNTER — Encounter: Payer: Self-pay | Admitting: Gastroenterology

## 2023-07-21 VITALS — BP 100/60 | HR 96 | Wt 112.0 lb

## 2023-07-21 DIAGNOSIS — R1310 Dysphagia, unspecified: Secondary | ICD-10-CM

## 2023-07-21 DIAGNOSIS — K219 Gastro-esophageal reflux disease without esophagitis: Secondary | ICD-10-CM

## 2023-07-21 DIAGNOSIS — Z8719 Personal history of other diseases of the digestive system: Secondary | ICD-10-CM | POA: Diagnosis not present

## 2023-07-21 DIAGNOSIS — K52831 Collagenous colitis: Secondary | ICD-10-CM

## 2023-07-21 MED ORDER — BUDESONIDE ER 9 MG PO CP24: 1.0000 | ORAL_CAPSULE | Freq: Every day | ORAL | 2 refills | Status: DC

## 2023-07-21 MED ORDER — PANTOPRAZOLE SODIUM 40 MG PO TBEC: 40.0000 mg | DELAYED_RELEASE_TABLET | Freq: Every day | ORAL | 3 refills | Status: DC

## 2023-07-21 MED ORDER — SUFLAVE 178.7 G PO SOLR: 1.0000 | Freq: Once | ORAL | 0 refills | Status: AC

## 2023-07-21 NOTE — Patient Instructions (Addendum)
Budesonide 9 mg once daily x 8 weeks, if improving can start to taper with 6 mg daily for 2 weeks follow by 3 mg daily for another 2 weeks.  If not better, please call. -Avoid NSAIDs, stop smoking.  -Can use imodium AD on as needed basis. -Avoid lactose and high fructose  You have been scheduled for a colonoscopy. Please follow written instructions given to you at your visit today.   If you use inhalers (even only as needed), please bring them with you on the day of your procedure.  DO NOT TAKE 7 DAYS PRIOR TO TEST- Trulicity (dulaglutide) Ozempic, Wegovy (semaglutide) Mounjaro (tirzepatide) Bydureon Bcise (exanatide extended release)  DO NOT TAKE 1 DAY PRIOR TO YOUR TEST Rybelsus (semaglutide) Adlyxin (lixisenatide) Victoza (liraglutide) Byetta (exanatide) ___________________________________________________________________________  Bonita Quin will receive your bowel preparation through [facility], which ensures the lowest copay and home delivery, with outreach via text or call from an [phone] number. Please respond promptly to avoid rescheduling of your procedure. If you are interested in alternative options or have any questions regarding your prep, please contact them at [phone] ____________________________________________________________________________  Your Provider Has Sent Your Bowel Prep Regimen To [facility]   [facility] will contact you to verify your information and collect your copay, if applicable. Enjoy the comfort of your home while your prescription is mailed to you, FREE of any shipping charges.   [facility] accepts all major insurance benefits and applies discounts & coupons.  Have additional questions?   Chat: [email or web address] Call: [phone] Email: [email or web address] NCPDP: [id number]  How will [facility] contact you?  With a Welcome phone call,  a Welcome text and a checkout link in text form.  Texts you receive from [phone] Are NOT  Spam.  *To set up delivery, you must complete the checkout process via link or speak to one of the patient care representatives. If [facility] is unable to reach you, your prescription may be delayed.  To avoid long hold times on the phone, you may also utilize the secure chat feature on the [facility] website to request that they call you back for transaction completion or to expedite your concerns.  _______________________________________________________  If your blood pressure at your visit was 140/90 or greater, please contact your primary care physician to follow up on this.  _______________________________________________________  If you are age 66 or older, your body mass index should be between 23-30. Your Body mass index is 16.54 kg/m. If this is out of the aforementioned range listed, please consider follow up with your Primary Care Provider.  If you are age 14 or younger, your body mass index should be between 19-25. Your Body mass index is 16.54 kg/m. If this is out of the aformentioned range listed, please consider follow up with your Primary Care Provider.   ________________________________________________________  The [facility] providers would like to encourage you to use [facility] to communicate with providers for non-urgent requests or questions.  Due to long hold times on the telephone, sending your provider a message by [facility] may be a faster and more efficient way to get a response.  Please allow 48 business hours for a response.  Please remember that this is for non-urgent requests.  _______________________________________________________ It was a pleasure to see you today!  Thank you for trusting me with your gastrointestinal care!

## 2023-07-21 NOTE — Telephone Encounter (Signed)
Pharmacy Patient Advocate Encounter  Received notification from [facility] that Prior Authorization for BUDESONIDE ER 9MG  has been DENIED.  Full denial letter will be uploaded to the media tab. See denial reason below.   PA #/Case ID/Reference #: [id number]

## 2023-07-21 NOTE — Progress Notes (Signed)
Chief Complaint: Recent episode of diverticulitis Primary GI MD: Dr. Christella Hartigan  HPI: 68 year old female history of collagenous colitis and others as listed below presents for evaluation of recent episode of diverticulitis  Last seen June 2021 by Dr. Christella Hartigan.  History of collagenous colitis confirmed on random biopsies with colonoscopy August 2016 in [place].  Patient recently seen in ED 04/17/2023 for abdominal pain with CT scan showing uncomplicated diverticulitis.  Patient was discharged home on antibiotics with recommendation to follow-up with GI.  Labs 04/17/2023 with normal CBC, CMP, lipase  Patient never had an episode of diverticulitis.  November was her first episode.  She had resolution of symptoms after antibiotics.    She has a longstanding history of collagenous colitis in which she was previously on desonide flare.  She was able to get into today otherwise twice per week and was doing well but has not had budesonide since 2021.  She was doing okay until over the last few months she has had increased loose stools, episodes of fecal incontinence, and profuse gas.  She also notes having worsening GERD symptoms and dysphagia.  She is not on any antacid.  She states if she swallows solids she feels like they get stuck in her suprasternal notch or mid sternum.  She also has significant dental issues in which she recently had implants and now does not have an upper set of teeth which has made it difficult for her to chew   PREVIOUS GI WORKUP   EGD September 2018 for melena was normal.  EGD August 2016  showed mild gastritis biopsies of the stomach and duodenum were normal.  Biopsies were negative for H. pylori or celiac sprue. colonoscopy August 2016 in [place], Dr. Lovell Sheehan for diarrhea and weight loss showed normal mucosa throughout except for scattered diverticulosis.  Random biopsies showed "collagenous colitis".  Establish care [facility]  gastroenterology September 2020.    Colonoscopy October 2011 for lower abdominal pain and diarrhea.  Examination was essentially normal.  Biopsies were taken from the right and left colon and were all completely normal on pathology.  Past Medical History:  Diagnosis Date   Anxiety    Arthritis    Collagenous colitis    Depression    Gallstone    History of kidney stones    Hypothyroidism    Migraines    MVP (mitral valve prolapse)    early 20's   Pneumonia    Swelling of both ankles    Varicose veins of left leg with edema     Past Surgical History:  Procedure Laterality Date   BARTHOLIN GLAND CYST EXCISION Bilateral    CAPSULAR RELEASE Right 03/30/2020   Procedure: RIGHT SHOULDER CAPSULAR RELEASE AND MANIPULATION UNDER ANESTHESIA , INJECTION;  Surgeon: Jones Broom, MD;  Location:  SURGERY CENTER;  Service: Orthopedics;  Laterality: Right;   COLONOSCOPY     HIP RESURFACING Left 2018   SALPINGECTOMY Left    TOTAL ABDOMINAL HYSTERECTOMY  2006   TOTAL HIP REVISION Left 12/30/2019   Procedure: LEFT TOTAL HIP REVISION;  Surgeon: Gean Birchwood, MD;  Location: [facility];  Service: Orthopedics;  Laterality: Left;   UPPER GI ENDOSCOPY      Current Outpatient Medications  Medication Sig Dispense Refill   Cholecalciferol (VITAMIN D3) 125 MCG (5000 UT) CAPS Take 5,000 Units by mouth daily.      citalopram (CELEXA) 40 MG tablet Take 40 mg by mouth daily.     cyanocobalamin 2000 MCG  tablet Take 2,000 mcg by mouth daily.     levothyroxine (SYNTHROID) 150 MCG tablet Take 150 mcg by mouth every morning.     Multiple Vitamin (MULTIVITAMIN) tablet Take 1 tablet by mouth daily.     nitroGLYCERIN (NITROSTAT) 0.4 MG SL tablet Place 1 tablet (0.4 mg total) under the tongue every 5 (five) minutes x 3 doses as needed for chest pain (if no relief after 3rd dose, proceed to ED or call 911). 25 tablet 1   SUFLAVE 178.7 g SOLR Take 1 kit by mouth once for 1 dose. 1 each 0   topiramate  (TOPAMAX) 100 MG tablet Take 100 mg by mouth 2 (two) times daily.     ZOLMitriptan (ZOMIG) 2.5 MG tablet Take 2.5 mg by mouth once. May repeat in 2 hours if headache persists or recurs.     budesonide (ENTOCORT EC) 3 MG 24 hr capsule Take 2 capsules alternating with 1 capsule x 1 month (Patient not taking: Reported on 07/21/2023) 60 capsule 6   No current facility-administered medications for this visit.    Allergies as of 07/21/2023 - Review Complete 07/21/2023  Allergen Reaction Noted   Sulfa antibiotics Swelling and Rash 02/20/2019    Family History  Problem Relation Age of Onset   Heart disease Mother    Dementia Mother    Arthritis Mother    Bladder Cancer Father    Diabetes Father    Heart disease Father    Arthritis Father    Diabetes Sister    Arthritis Sister    Dementia Maternal Grandmother    Heart disease Maternal Grandfather    Diabetes Paternal Grandfather    Diabetes Sister    Arthritis Sister    Arthritis Sister    Dementia Maternal Aunt        x 2    Social History   Socioeconomic History   Marital status: Single    Spouse name: Not on file   Number of children: 0   Years of education: Not on file   Highest education level: Not on file  Occupational History   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  Tobacco Use   Smoking status: Former    Current packs/day: 0.00    Types: Cigarettes    Quit date: 02/20/2016    Years since quitting: 7.4   Smokeless tobacco: Never  Vaping Use   Vaping status: Never Used  Substance and Sexual Activity   Alcohol use: Not Currently    Comment: rarely on occasions   Drug use: Never   Sexual activity: Not on file  Other Topics Concern   Not on file  Social History Narrative   Not on file   Social Drivers of Health   Financial Resource Strain: Not on file  Food Insecurity: Not on file  Transportation Needs: Not on file  Physical Activity: Not on file  Stress: Not on file  Social Connections: Not on file  Intimate  Partner Violence: Not on file    Review of Systems:    Constitutional: No weight loss, fever, chills, weakness or fatigue HEENT: Eyes: No change in vision               Ears, Nose, Throat:  No change in hearing or congestion Skin: No rash or itching Cardiovascular: No chest pain, chest pressure or palpitations   Respiratory: No SOB or cough Gastrointestinal: See HPI and otherwise negative Genitourinary: No dysuria or change in urinary frequency Neurological: No headache, dizziness or syncope Musculoskeletal: No  new muscle or joint pain Hematologic: No bleeding or bruising Psychiatric: No history of depression or anxiety    Physical Exam:  Vital signs: BP 100/60   Pulse 96   Wt 112 lb (50.8 kg)   BMI 16.54 kg/m   Constitutional: NAD, Well developed, Well nourished, alert and cooperative Head:  Normocephalic and atraumatic. Eyes:   PEERL, EOMI. No icterus. Conjunctiva pink. Respiratory: Respirations even and unlabored. Lungs clear to auscultation bilaterally.   No wheezes, crackles, or rhonchi.  Cardiovascular:  Regular rate and rhythm. No peripheral edema, cyanosis or pallor.  Gastrointestinal:  Soft, nondistended, nontender. No rebound or guarding. Normal bowel sounds. No appreciable masses or hepatomegaly. Rectal:  Not performed.  Msk:  Symmetrical without gross deformities. Without edema, no deformity or joint abnormality.  Neurologic:  Alert and  oriented x4;  grossly normal neurologically.  Skin:   Dry and intact without significant lesions or rashes. Psychiatric: Oriented to person, place and time. Demonstrates good judgement and reason without abnormal affect or behaviors.   RELEVANT LABS AND IMAGING: CBC    Component Value Date/Time   WBC 8.2 04/17/2023 1205   RBC 4.04 04/17/2023 1205   HGB 12.9 04/17/2023 1205   HCT 39.8 04/17/2023 1205   PLT 241 04/17/2023 1205   MCV 98.5 04/17/2023 1205   MCH 31.9 04/17/2023 1205   MCHC 32.4 04/17/2023 1205   RDW 13.1  04/17/2023 1205   LYMPHSABS 1.9 12/26/2019 0849   MONOABS 0.8 12/26/2019 0849   EOSABS 0.1 12/26/2019 0849   BASOSABS 0.1 12/26/2019 0849    CMP     Component Value Date/Time   NA 134 (L) 04/17/2023 1205   K 3.7 04/17/2023 1205   CL 108 04/17/2023 1205   CO2 15 (L) 04/17/2023 1205   GLUCOSE 94 04/17/2023 1205   BUN 12 04/17/2023 1205   CREATININE 0.81 04/17/2023 1205   CALCIUM 8.9 04/17/2023 1205   PROT 7.3 04/17/2023 1205   ALBUMIN 3.7 04/17/2023 1205   AST 16 04/17/2023 1205   ALT 12 04/17/2023 1205   ALKPHOS 94 04/17/2023 1205   BILITOT 0.8 04/17/2023 1205   GFRNONAA >60 04/17/2023 1205   GFRAA >60 12/31/2019 8295     Assessment/Plan:   History of diverticulitis History of collagenous colitis Diverticulitis treated with antibiotics November 2024 (initial episode) with resolution.  History of collagenous colitis previously well-controlled on budesonide has not had budesonide since 2021 and currently having symptoms.  Last colonoscopy in 2016. - Schedule colonoscopy - I thoroughly discussed the procedure with the patient (at bedside) to include nature of the procedure, alternatives, benefits, and risks (including but not limited to bleeding, infection, perforation, anesthesia/cardiac pulmonary complications).  Patient verbalized understanding and gave verbal consent to proceed with procedure.  --Budesonide 9 mg once daily x 8 weeks, if improving can start to taper with 6 mg daily for 2 weeks follow by 3 mg daily for another 2 weeks.  If not better, please call. -Avoid NSAIDs, stop smoking.  -Can use imodium AD on as needed basis. -Avoid lactose and high fructose  Dysphagia GERD History of dysphagia to solids and increasing GERD.  Currently not on any antacid.  She does have history of dental problems likely worsened by nightly dysphagia.  DDx includes esophagitis, gastritis, PUD, strictures.  No extensive NSAID use. -- EGD with possible dilation for further evaluation -  Trial of pantoprazole 40 Mg once daily - Educated patient on lifestyle modifications of provide patient had a possible yesterday  Boone Master, PA-C [facility] 07/21/2023, 2:17 PM  Cc: Irena Reichmann, DO

## 2023-07-21 NOTE — Telephone Encounter (Signed)
Pharmacy Patient Advocate Encounter   Received notification from [employer]eds that prior authorization for BUDESONE ER 9MG  is required/requested.   Insurance verification completed.   The patient is insured through [facility] .   Per test claim: PA required; PA submitted to above mentioned insurance via CoverMyMeds Key/confirmation #/EOC [id number] Status is pending

## 2023-07-22 ENCOUNTER — Other Ambulatory Visit: Payer: Self-pay | Admitting: Gastroenterology

## 2023-07-24 MED ORDER — BUDESONIDE 3 MG PO CPEP: ORAL_CAPSULE | ORAL | 0 refills | Status: AC

## 2023-07-24 NOTE — Telephone Encounter (Signed)
 Rx should be for budesonide 3 mg capsules so they can be tapered.  9 mg x 8 weeks, 6 mg x 2 weeks, 3 mg x 2 weeks.  Corrected rx sent.

## 2023-08-29 ENCOUNTER — Encounter: Payer: Self-pay | Admitting: Gastroenterology

## 2023-08-29 ENCOUNTER — Ambulatory Visit: Payer: Medicare Other | Admitting: Gastroenterology

## 2023-08-29 VITALS — BP 101/60 | HR 93 | Temp 97.9°F | Resp 16 | Ht 69.0 in | Wt 112.0 lb

## 2023-08-29 DIAGNOSIS — K52831 Collagenous colitis: Secondary | ICD-10-CM

## 2023-08-29 DIAGNOSIS — D122 Benign neoplasm of ascending colon: Secondary | ICD-10-CM | POA: Diagnosis not present

## 2023-08-29 DIAGNOSIS — K222 Esophageal obstruction: Secondary | ICD-10-CM

## 2023-08-29 DIAGNOSIS — R197 Diarrhea, unspecified: Secondary | ICD-10-CM

## 2023-08-29 DIAGNOSIS — K635 Polyp of colon: Secondary | ICD-10-CM | POA: Diagnosis not present

## 2023-08-29 DIAGNOSIS — K5730 Diverticulosis of large intestine without perforation or abscess without bleeding: Secondary | ICD-10-CM

## 2023-08-29 DIAGNOSIS — K529 Noninfective gastroenteritis and colitis, unspecified: Secondary | ICD-10-CM | POA: Diagnosis not present

## 2023-08-29 DIAGNOSIS — R1310 Dysphagia, unspecified: Secondary | ICD-10-CM

## 2023-08-29 DIAGNOSIS — K644 Residual hemorrhoidal skin tags: Secondary | ICD-10-CM

## 2023-08-29 DIAGNOSIS — K648 Other hemorrhoids: Secondary | ICD-10-CM | POA: Diagnosis not present

## 2023-08-29 MED ORDER — PANTOPRAZOLE SODIUM 40 MG PO TBEC
40.0000 mg | DELAYED_RELEASE_TABLET | Freq: Every day | ORAL | 3 refills | Status: AC
Start: 2023-08-29 — End: ?

## 2023-08-29 MED ORDER — SODIUM CHLORIDE 0.9 % IV SOLN: 500.0000 mL | INTRAVENOUS | Status: DC

## 2023-08-29 NOTE — Progress Notes (Signed)
 Pt's states no medical or surgical changes since previsit or office visit.

## 2023-08-29 NOTE — Patient Instructions (Addendum)
 - Continue present medications. - Follow an antireflux regimen. - Use Protonix (pantoprazole) 40 mg PO daily. Rx for 90 days with 3 refills - Repeat colonoscopy in 3 years for surveillance based on pathology results.  YOU HAD AN ENDOSCOPIC PROCEDURE TODAY AT THE [facility]:   Refer to the procedure report that was given to you for any specific questions about what was found during the examination.  If the procedure report does not answer your questions, please call your gastroenterologist to clarify.  If you requested that your care partner not be given the details of your procedure findings, then the procedure report has been included in a sealed envelope for you to review at your convenience later.  YOU SHOULD EXPECT: Some feelings of bloating in the abdomen. Passage of more gas than usual.  Walking can help get rid of the air that was put into your GI tract during the procedure and reduce the bloating. If you had a lower endoscopy (such as a colonoscopy or flexible sigmoidoscopy) you may notice spotting of blood in your stool or on the toilet paper. If you underwent a bowel prep for your procedure, you may not have a normal bowel movement for a few days.  Please Note:  You might notice some irritation and congestion in your nose or some drainage.  This is from the oxygen used during your procedure.  There is no need for concern and it should clear up in a day or so.  SYMPTOMS TO REPORT IMMEDIATELY:  Following lower endoscopy (colonoscopy or flexible sigmoidoscopy):  Excessive amounts of blood in the stool  Significant tenderness or worsening of abdominal pains  Swelling of the abdomen that is new, acute  Fever of 100F or higher  Following upper endoscopy (EGD)  Vomiting of blood or coffee ground material  New chest pain or pain under the shoulder blades  Painful or persistently difficult swallowing  New shortness of breath  Fever of 100F or higher  Black, tarry-looking  stools  For urgent or emergent issues, a gastroenterologist can be reached at any hour by calling [phone]. Do not use MyChart messaging for urgent concerns.    DIET:  We do recommend a small meal at first, but then you may proceed to your regular diet.  Drink plenty of fluids but you should avoid alcoholic beverages for 24 hours.  ACTIVITY:  You should plan to take it easy for the rest of today and you should NOT DRIVE or use heavy machinery until tomorrow (because of the sedation medicines used during the test).    FOLLOW UP: Our staff will call the number listed on your records the next business day following your procedure.  We will call around 7:15- 8:00 am to check on you and address any questions or concerns that you may have regarding the information given to you following your procedure. If we do not reach you, we will leave a message.     If any biopsies were taken you will be contacted by phone or by letter within the next 1-3 weeks.  Please call us at [phone] if you have not heard about the biopsies in 3 weeks.    SIGNATURES/CONFIDENTIALITY: You and/or your care partner have signed paperwork which will be entered into your electronic medical record.  These signatures attest to the fact that that the information above on your After Visit Summary has been reviewed and is understood.  Full responsibility of the confidentiality of this discharge information  lies with you and/or your care-partner.

## 2023-08-29 NOTE — Progress Notes (Signed)
 [facility] History and Physical   Primary Care Physician:  Irena Reichmann, DO   Reason for Procedure:  Dysphagia, history of collagenous colitis, chronic diarrhea, s/p acute diverticulitis  Plan:    EGD and colonoscopy with possible interventions as needed     HPI: Angelica Stone is a very pleasant 68 y.o. female here for EGD and colonoscopy for evaluation of dysphagia,, s/p acute diverticulitis, chronic diarrhea with history of collagenous colitis. Please refer to office visit note by Cira Servant July 21, 2023 for additional details  The risks and benefits as well as alternatives of endoscopic procedure(s) have been discussed and reviewed. All questions answered. The patient agrees to proceed.    Past Medical History:  Diagnosis Date   Allergy    Anxiety    Arthritis    Collagenous colitis    Depression    Gallstone    History of kidney stones    Hypothyroidism    Migraines    MVP (mitral valve prolapse)    early 20's   Pneumonia    Swelling of both ankles    Varicose veins of left leg with edema     Past Surgical History:  Procedure Laterality Date   BARTHOLIN GLAND CYST EXCISION Bilateral    CAPSULAR RELEASE Right 03/30/2020   Procedure: RIGHT SHOULDER CAPSULAR RELEASE AND MANIPULATION UNDER ANESTHESIA , INJECTION;  Surgeon: Jones Broom, MD;  Location: [facility];  Service: Orthopedics;  Laterality: Right;   COLONOSCOPY     HIP RESURFACING Left 2018   SALPINGECTOMY Left    TOTAL ABDOMINAL HYSTERECTOMY  2006   TOTAL HIP REVISION Left 12/30/2019   Procedure: LEFT TOTAL HIP REVISION;  Surgeon: Gean Birchwood, MD;  Location: [facility];  Service: Orthopedics;  Laterality: Left;   UPPER GI ENDOSCOPY      Prior to Admission medications   Medication Sig Start Date End Date Taking? Authorizing Provider  budesonide (ENTOCORT EC) 3 MG 24 hr capsule Take 3 capsules (9 mg total) by mouth daily for 60 days, THEN 2 capsules (6 mg total)  daily for 14 days, THEN 1 capsule (3 mg total) daily for 14 days. Take 2 capsules alternating with 1 capsule x 1 month. 07/24/23 10/20/23 Yes McMichael, Bayley M, PA-C  Cholecalciferol (VITAMIN D3) 125 MCG (5000 UT) CAPS Take 5,000 Units by mouth daily.    Yes [provider]  citalopram (CELEXA) 40 MG tablet Take 40 mg by mouth daily.   Yes [provider]  levothyroxine (SYNTHROID) 150 MCG tablet Take 150 mcg by mouth every morning. 05/14/22  Yes [provider]  Multiple Vitamin (MULTIVITAMIN) tablet Take 1 tablet by mouth daily.   Yes [provider]  pantoprazole (PROTONIX) 40 MG tablet Take 1 tablet (40 mg total) by mouth daily. 07/21/23  Yes McMichael, Bayley M, PA-C  rosuvastatin (CRESTOR) 20 MG tablet 1 tablet Orally Once a day for 90 days for cholesterol   Yes [provider]  topiramate (TOPAMAX) 100 MG tablet Take 100 mg by mouth 2 (two) times daily.   Yes [provider]  cyanocobalamin 2000 MCG tablet Take 2,000 mcg by mouth daily.    [provider]  nitroGLYCERIN (NITROSTAT) 0.4 MG SL tablet Place 1 tablet (0.4 mg total) under the tongue every 5 (five) minutes x 3 doses as needed for chest pain (if no relief after 3rd dose, proceed to ED or call 911). Patient not taking: Reported on 08/29/2023 05/18/22   Mallipeddi, Orion Modest, MD  ZOLMitriptan (ZOMIG) 2.5 MG tablet Take 2.5 mg by mouth once. May repeat in 2 hours if headache persists or recurs.    [provider]    Current Outpatient Medications  Medication Sig Dispense Refill   budesonide (ENTOCORT EC) 3 MG 24 hr capsule Take 3 capsules (9 mg total) by mouth daily for 60 days, THEN 2 capsules (6 mg total) daily for 14 days, THEN 1 capsule (3 mg total) daily for 14 days. Take 2 capsules alternating with 1 capsule x 1 month. 222 capsule 0   Cholecalciferol (VITAMIN D3) 125 MCG (5000 UT) CAPS Take 5,000 Units by mouth daily.      citalopram (CELEXA) 40 MG tablet Take  40 mg by mouth daily.     levothyroxine (SYNTHROID) 150 MCG tablet Take 150 mcg by mouth every morning.     Multiple Vitamin (MULTIVITAMIN) tablet Take 1 tablet by mouth daily.     pantoprazole (PROTONIX) 40 MG tablet Take 1 tablet (40 mg total) by mouth daily. 30 tablet 3   rosuvastatin (CRESTOR) 20 MG tablet 1 tablet Orally Once a day for 90 days for cholesterol     topiramate (TOPAMAX) 100 MG tablet Take 100 mg by mouth 2 (two) times daily.     cyanocobalamin 2000 MCG tablet Take 2,000 mcg by mouth daily.     nitroGLYCERIN (NITROSTAT) 0.4 MG SL tablet Place 1 tablet (0.4 mg total) under the tongue every 5 (five) minutes x 3 doses as needed for chest pain (if no relief after 3rd dose, proceed to ED or call 911). (Patient not taking: Reported on 08/29/2023) 25 tablet 1   ZOLMitriptan (ZOMIG) 2.5 MG tablet Take 2.5 mg by mouth once. May repeat in 2 hours if headache persists or recurs.     Current Facility-Administered Medications  Medication Dose Route Frequency Provider Last Rate Last Admin   0.9 %  sodium chloride infusion  500 mL Intravenous Continuous Doreatha Offer, Eleonore Chiquito, MD        Allergies as of 08/29/2023 - Review Complete 08/29/2023  Allergen Reaction Noted   Sulfa antibiotics Swelling and Rash 02/20/2019    Family History  Problem Relation Age of Onset   Heart disease Mother    Dementia Mother    Arthritis Mother    Bladder Cancer Father    Diabetes Father    Heart disease Father    Arthritis Father    Diabetes Sister    Arthritis Sister    Diabetes Sister    Arthritis Sister    Arthritis Sister    Dementia Maternal Aunt        x 2   Dementia Maternal Grandmother    Heart disease Maternal Grandfather    Diabetes Paternal Grandfather    Colon cancer Neg Hx    Stomach cancer Neg Hx    Esophageal cancer Neg Hx    Rectal cancer Neg Hx     Social History   Socioeconomic History   Marital status: Single    Spouse name: Not on file   Number of children: 0   Years  of education: Not on file   Highest education level: Not on file  Occupational History   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  Tobacco Use   Smoking status: Former    Current packs/day: 0.00    Types: Cigarettes    Quit date: 02/20/2016    Years since quitting: 7.5   Smokeless tobacco: Never  Vaping Use   Vaping status: Never Used  Substance and  Sexual Activity   Alcohol use: Not Currently    Comment: rarely on occasions   Drug use: Never   Sexual activity: Not on file  Other Topics Concern   Not on file  Social History Narrative   Not on file   Social Drivers of Health   Financial Resource Strain: Not on file  Food Insecurity: Not on file  Transportation Needs: Not on file  Physical Activity: Not on file  Stress: Not on file  Social Connections: Not on file  Intimate Partner Violence: Not on file    Review of Systems:  All other review of systems negative except as mentioned in the HPI.  Physical Exam: Vital signs in last 24 hours: BP (!) 98/57   Pulse 93   Temp 97.9 F (36.6 C)   Ht 5\' 9"  (1.753 m)   Wt 112 lb (50.8 kg)   SpO2 98%   BMI 16.54 kg/m  General:   Alert, NAD Lungs:  Clear .   Heart:  Regular rate and rhythm Abdomen:  Soft, nontender and nondistended. Neuro/Psych:  Alert and cooperative. Normal mood and affect. A and O x 3  Reviewed labs, radiology imaging, old records and pertinent past GI work up  Patient is appropriate for planned procedure(s) and anesthesia in an ambulatory setting   K. Scherry Ran , MD [phone]

## 2023-08-29 NOTE — Progress Notes (Signed)
 To pacu, VSS. Report to [email or web address]

## 2023-08-29 NOTE — Op Note (Signed)
 [facility] Patient Name: Angelica Stone Procedure Date: 08/29/2023 12:22 PM MRN: [id number] Endoscopist: Napoleon Form , MD, [id number] Age: 68 Referring MD:  Date of Birth: 05/05/1956 Gender: Female Account #: [id number] Procedure:                Colonoscopy Indications:              Clinically significant diarrhea of unexplained                            origin, history of collagenous colitis. Follow-up                            of diverticulitis Medicines:                Monitored Anesthesia Care Procedure:                Pre-Anesthesia Assessment:                           - Prior to the procedure, a History and Physical                            was performed, and patient medications and                            allergies were reviewed. The patient's tolerance of                            previous anesthesia was also reviewed. The risks                            and benefits of the procedure and the sedation                            options and risks were discussed with the patient.                            All questions were answered, and informed consent                            was obtained. Prior Anticoagulants: The patient has                            taken no anticoagulant or antiplatelet agents. ASA                            Grade Assessment: II - A patient with mild systemic                            disease. After reviewing the risks and benefits,                            the patient was deemed in satisfactory condition to  undergo the procedure.                           After obtaining informed consent, the colonoscope                            was passed under direct vision. Throughout the                            procedure, the patient's blood pressure, pulse, and                            oxygen saturations were monitored continuously. The                            [id number]-HQ190L Colonoscope [id number] was  introduced                            through the anus and advanced to the the cecum,                            identified by appendiceal orifice and ileocecal                            valve. The colonoscopy was performed without                            difficulty. The patient tolerated the procedure                            well. The quality of the bowel preparation was                            adequate to identify polyps greater than 5 mm in                            size. The terminal ileum, ileocecal valve,                            appendiceal orifice, and rectum were photographed. Scope In: 1:43:39 PM Scope Out: 2:01:22 PM Scope Withdrawal Time: 0 hours 9 minutes 30 seconds  Total Procedure Duration: 0 hours 17 minutes 43 seconds  Findings:                 The perianal and digital rectal examinations were                            normal.                           An 8 mm polyp was found in the ascending colon. The                            polyp was sessile. The polyp was removed with a  cold snare. Resection and retrieval were complete.                           Normal mucosa was found in the entire colon.                            Biopsies for histology were taken with a cold                            forceps from the right colon and left colon for                            evaluation of microscopic colitis.                           Scattered medium-mouthed and small-mouthed                            diverticula were found in the sigmoid colon.                           Non-bleeding external and internal hemorrhoids were                            found during retroflexion. The hemorrhoids were                            medium-sized. Complications:            No immediate complications. Estimated Blood Loss:     Estimated blood loss was minimal. Impression:               - One 8 mm polyp in the ascending colon, removed                             with a cold snare. Resected and retrieved.                           - Normal mucosa in the entire examined colon.                            Biopsied.                           - Moderate diverticulosis in the sigmoid colon.                           - Non-bleeding external and internal hemorrhoids. Recommendation:           - Patient has a contact number available for                            emergencies. The signs and symptoms of potential                            delayed complications were discussed with the  patient. Return to normal activities tomorrow.                            Written discharge instructions were provided to the                            patient.                           - Resume previous diet.                           - Continue present medications.                           - Await pathology results.                           - Repeat colonoscopy in 3 years for surveillance                            based on pathology results.                           - For future colonoscopy the patient will require                            an extended preparation. If there are any                            questions, please contact the gastroenterologist. Napoleon Form, MD 08/29/2023 2:08:39 PM This report has been signed electronically.

## 2023-08-29 NOTE — Op Note (Signed)
 [facility] Patient Name: Angelica Stone Procedure Date: 08/29/2023 1:20 PM MRN: [id number] Endoscopist: Napoleon Form , MD, [id number] Age: 68 Referring MD:  Date of Birth: 18-Feb-1956 Gender: Female Account #: [id number] Procedure:                Upper GI endoscopy Indications:              Dysphagia Medicines:                Monitored Anesthesia Care Procedure:                Pre-Anesthesia Assessment:                           - Prior to the procedure, a History and Physical                            was performed, and patient medications and                            allergies were reviewed. The patient's tolerance of                            previous anesthesia was also reviewed. The risks                            and benefits of the procedure and the sedation                            options and risks were discussed with the patient.                            All questions were answered, and informed consent                            was obtained. Prior Anticoagulants: The patient has                            taken no anticoagulant or antiplatelet agents. ASA                            Grade Assessment: II - A patient with mild systemic                            disease. After reviewing the risks and benefits,                            the patient was deemed in satisfactory condition to                            undergo the procedure.                           After obtaining informed consent, the endoscope was  passed under direct vision. Throughout the                            procedure, the patient's blood pressure, pulse, and                            oxygen saturations were monitored continuously. The                            GIF HQ190 #[id number] was introduced through the                            mouth, and advanced to the second part of duodenum.                            The upper GI endoscopy was accomplished  without                            difficulty. The patient tolerated the procedure                            well. Scope In: Scope Out: Findings:                 One benign-appearing, intrinsic moderate stenosis                            was found 39 to 40 cm from the incisors. This                            stenosis measured 1.2 cm (inner diameter) x less                            than one cm (in length). The stenosis was                            traversed. A TTS dilator was passed through the                            scope. Dilation with a 15-16.5-18 mm x 8 cm CRE                            balloon dilator was performed to 18 mm. The                            dilation site was examined following endoscope                            reinsertion and showed mild mucosal disruption.                           The stomach was normal.  The cardia and gastric fundus were normal on                            retroflexion.                           The examined duodenum was normal. Complications:            No immediate complications. Estimated Blood Loss:     Estimated blood loss was minimal. Impression:               - Benign-appearing esophageal stenosis. Dilated.                           - Normal stomach.                           - Normal examined duodenum.                           - No specimens collected. Recommendation:           - Resume previous diet.                           - Continue present medications.                           - Follow an antireflux regimen.                           - Use Protonix (pantoprazole) 40 mg PO daily. Rx                            for 90 days with 3 refills Napoleon Form, MD 08/29/2023 2:11:53 PM This report has been signed electronically.

## 2023-08-30 ENCOUNTER — Telehealth: Payer: Self-pay | Admitting: *Deleted

## 2023-08-30 NOTE — Telephone Encounter (Signed)
 No answer on  follow up call. Left message.

## 2023-09-04 LAB — SURGICAL PATHOLOGY

## 2023-10-18 ENCOUNTER — Ambulatory Visit: Payer: Self-pay | Admitting: Gastroenterology

## 2023-10-30 ENCOUNTER — Other Ambulatory Visit: Payer: Self-pay

## 2023-10-30 ENCOUNTER — Emergency Department (HOSPITAL_BASED_OUTPATIENT_CLINIC_OR_DEPARTMENT_OTHER): Admission: EM | Admit: 2023-10-30 | Discharge: 2023-10-30 | Disposition: A | Discharge status: Home / Self Care

## 2023-10-30 DIAGNOSIS — E876 Hypokalemia: Secondary | ICD-10-CM

## 2023-10-30 DIAGNOSIS — R5383 Other fatigue: Secondary | ICD-10-CM

## 2023-10-30 DIAGNOSIS — N3000 Acute cystitis without hematuria: Secondary | ICD-10-CM | POA: Diagnosis not present

## 2023-10-30 LAB — COMPREHENSIVE METABOLIC PANEL WITH GFR
ALT: 14 U/L (ref 0–44)
AST: 25 U/L (ref 15–41)
Albumin: 4.5 g/dL (ref 3.5–5.0)
Alkaline Phosphatase: 97 U/L (ref 38–126)
Anion gap: 15 (ref 5–15)
BUN: 15 mg/dL (ref 8–23)
CO2: 21 mmol/L — ABNORMAL LOW (ref 22–32)
Calcium: 9.8 mg/dL (ref 8.9–10.3)
Chloride: 101 mmol/L (ref 98–111)
Creatinine, Ser: 0.86 mg/dL (ref 0.44–1.00)
GFR, Estimated: >60 mL/min (ref >60)
Glucose, Bld: 137 mg/dL — ABNORMAL HIGH (ref 70–99)
Potassium: 3 mmol/L — ABNORMAL LOW (ref 3.5–5.1)
Sodium: 138 mmol/L (ref 135–145)
Total Bilirubin: 0.4 mg/dL (ref 0.0–1.2)
Total Protein: 7.9 g/dL (ref 6.5–8.1)

## 2023-10-30 LAB — URINALYSIS, ROUTINE W REFLEX MICROSCOPIC
Bacteria, UA: NONE SEEN
Bilirubin Urine: NEGATIVE
Glucose, UA: NEGATIVE mg/dL
Hgb urine dipstick: NEGATIVE
Ketones, ur: NEGATIVE mg/dL
Nitrite: NEGATIVE
Protein, ur: 30 mg/dL — AB
Specific Gravity, Urine: 1.018 (ref 1.005–1.030)
pH: 6.5 (ref 5.0–8.0)

## 2023-10-30 LAB — CBC WITH DIFFERENTIAL/PLATELET
Abs Immature Granulocytes: 0.01 10*3/uL (ref 0.00–0.07)
Basophils Absolute: 0 10*3/uL (ref 0.0–0.1)
Basophils Relative: 1 %
Eosinophils Absolute: 0.1 10*3/uL (ref 0.0–0.5)
Eosinophils Relative: 2 %
HCT: 41.3 % (ref 36.0–46.0)
Hemoglobin: 14 g/dL (ref 12.0–15.0)
Immature Granulocytes: 0 %
Lymphocytes Relative: 44 %
Lymphs Abs: 2.4 10*3/uL (ref 0.7–4.0)
MCH: 32.9 pg (ref 26.0–34.0)
MCHC: 33.9 g/dL (ref 30.0–36.0)
MCV: 96.9 fL (ref 80.0–100.0)
Monocytes Absolute: 0.5 10*3/uL (ref 0.1–1.0)
Monocytes Relative: 9 %
Neutro Abs: 2.5 10*3/uL (ref 1.7–7.7)
Neutrophils Relative %: 44 %
Platelets: 215 10*3/uL (ref 150–400)
RBC: 4.26 MIL/uL (ref 3.87–5.11)
RDW: 12.6 % (ref 11.5–15.5)
WBC: 5.5 10*3/uL (ref 4.0–10.5)
nRBC: 0 % (ref 0.0–0.2)

## 2023-10-30 LAB — MAGNESIUM: Magnesium: 2.5 mg/dL — ABNORMAL HIGH (ref 1.7–2.4)

## 2023-10-30 LAB — LIPASE, BLOOD: Lipase: 40 U/L (ref 11–51)

## 2023-10-30 MED ORDER — ONDANSETRON HCL 4 MG/2ML IJ SOLN
4.0000 mg | Freq: Once | INTRAMUSCULAR | Status: AC
  Administered 2023-10-30: 4 mg via INTRAVENOUS
  Filled 2023-10-30: qty 2

## 2023-10-30 MED ORDER — ONDANSETRON 4 MG PO TBDP: 4.0000 mg | ORAL_TABLET | Freq: Three times a day (TID) | ORAL | 0 refills | Status: AC | PRN

## 2023-10-30 MED ORDER — SODIUM CHLORIDE 0.9 % IV BOLUS
1000.0000 mL | Freq: Once | INTRAVENOUS | Status: AC
  Administered 2023-10-30: 1000 mL via INTRAVENOUS

## 2023-10-30 MED ORDER — NITROFURANTOIN MONOHYD MACRO 100 MG PO CAPS: 100.0000 mg | ORAL_CAPSULE | Freq: Two times a day (BID) | ORAL | 0 refills | Status: DC

## 2023-10-30 MED ORDER — POTASSIUM CHLORIDE CRYS ER 20 MEQ PO TBCR
40.0000 meq | EXTENDED_RELEASE_TABLET | Freq: Once | ORAL | Status: AC
  Administered 2023-10-30: 40 meq via ORAL
  Filled 2023-10-30: qty 2

## 2023-10-30 NOTE — Discharge Instructions (Addendum)
 Please read and follow all provided instructions.  Your diagnoses today include:  1. Fatigue, unspecified type   2. Acute cystitis without hematuria   3. Hypokalemia     Tests performed today include: Complete blood cell count: Was normal with normal white blood cells and red blood cells Basic metabolic panel: Potassium was slightly low Urinalysis (urine test): Shows question of urinary infection RMSF and Lyme disease testing: Pending EKG does not show any irritation of the heart or abnormal heart rhythms Vital signs. See below for your results today.   Medications prescribed:  Macrobid - antibiotic for urinary tract infection  You have been prescribed an antibiotic medicine: take the entire course of medicine even if you are feeling better. Stopping early can cause the antibiotic not to work.  Zofran  (ondansetron ) - for nausea and vomiting  Take any prescribed medications only as directed.  Home care instructions:  Follow any educational materials contained in this packet.  BE VERY CAREFUL not to take multiple medicines containing Tylenol  (also called acetaminophen ). Doing so can lead to an overdose which can damage your liver and cause liver failure and possibly death.   Follow-up instructions: Please follow-up with your primary care provider in the next 3 days for further evaluation of your symptoms.   Return instructions:  Please return to the Emergency Department if you experience worsening symptoms.  Return if you develop a fever, persistent vomiting, difficulty breathing. Please return if you have any other emergent concerns.  Additional Information:  Your vital signs today were: BP 116/76   Pulse 62   Temp 97.8 F (36.6 C) (Oral)   Resp 17   Ht 5\' 9"  (1.753 m)   Wt 47.6 kg   SpO2 99%   BMI 15.51 kg/m  If your blood pressure (BP) was elevated above 135/85 this visit, please have this repeated by your doctor within one month. --------------

## 2023-10-30 NOTE — ED Triage Notes (Signed)
 Pt POV reporting reporting increased weakness due to persistent migraine x3 days, hx of same, has caused decreased appetite.

## 2023-10-30 NOTE — ED Provider Notes (Signed)
 [place] [facility] AT [facility] Provider Note   CSN: [id number] Arrival date & time: 10/30/23  1739     History  Chief Complaint  Patient presents with   Fatigue    Angelica Stone is a 67 y.o. female.  Patient with history of migraine headaches, collagenous colitis causing chronic diarrhea --presents to the emergency department today for evaluation of fatigue and nausea.  Patient has felt poorly over the past 3 to 4 days.  She has not wanted to eat or drink anything because of nausea.  She has not vomited.  No abdominal pain.  Stooling at baseline without blood.  She has been weak and very fatigued.  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  Patient does report having a rash starting about a week ago.  She states that she is prone to "sun poisoning" and also contact dermatitis.  She had an itchy rash that developed on her body that has mostly resolved.  Over the past few days she has had headaches, no current headache.  Headache consistent with migraines.  Family concerned about the patient's nutritional status.  Patient denies tick bites [place]  No fevers.  Family reports patient was orthostatic at home.       Home Medications Prior to Admission medications   Medication Sig Start Date End Date Taking? Authorizing Provider  Cholecalciferol (VITAMIN D3) 125 MCG (5000 UT) CAPS Take 5,000 Units by mouth daily.     [provider]  citalopram  (CELEXA ) 40 MG tablet Take 40 mg by mouth daily.    [provider]  cyanocobalamin  2000 MCG tablet Take 2,000 mcg by mouth daily.    [provider]  levothyroxine  (SYNTHROID ) 150 MCG tablet Take 150 mcg by mouth every morning. 05/14/22   [provider]  Multiple Vitamin (MULTIVITAMIN) tablet Take 1 tablet by mouth daily.    [provider]  nitroGLYCERIN  (NITROSTAT ) 0.4 MG SL tablet  Place 1 tablet (0.4 mg total) under the tongue every 5 (five) minutes x 3 doses as needed for chest pain (if no relief after 3rd dose, proceed to ED or call 911). Patient not taking: Reported on 08/29/2023 05/18/22   Mallipeddi, Vishnu P, MD  pantoprazole  (PROTONIX ) 40 MG tablet Take 1 tablet (40 mg total) by mouth daily. 07/21/23   McMichael, Elba Greathouse, PA-C  pantoprazole  (PROTONIX ) 40 MG tablet Take 1 tablet (40 mg total) by mouth daily. 08/29/23   Nandigam, Kavitha V, MD  rosuvastatin (CRESTOR) 20 MG tablet 1 tablet Orally Once a day for 90 days for cholesterol    [provider]  topiramate  (TOPAMAX ) 100 MG tablet Take 100 mg by mouth 2 (two) times daily.    [provider]  ZOLMitriptan (ZOMIG) 2.5 MG tablet Take 2.5 mg by mouth once. May repeat in 2 hours if headache persists or recurs.    [provider]      Allergies    Sulfa antibiotics    Review of Systems   Review of Systems  Physical Exam Updated Vital Signs BP 127/84   Pulse 72   Temp 97.6 F (36.4 C) (Oral)   Resp 18   Ht 5\' 9"  (1.753 m)   Wt 47.6 kg   SpO2 100%   BMI 15.51 kg/m   Physical Exam Vitals and nursing note reviewed.  Constitutional:      General:  She is not in acute distress.    Appearance: She is well-developed. She is not diaphoretic.  HENT:     Head: Normocephalic and atraumatic.     Right Ear: External ear normal.     Left Ear: External ear normal.     Nose: Nose normal.     Mouth/Throat:     Mouth: Mucous membranes are not dry.  Eyes:     Conjunctiva/sclera: Conjunctivae normal.  Neck:     Vascular: Normal carotid pulses. No JVD.     Trachea: Trachea normal. No tracheal deviation.  Cardiovascular:     Rate and Rhythm: Normal rate and regular rhythm.     Pulses: No decreased pulses.          Radial pulses are 2+ on the right side and 2+ on the left side.     Heart sounds: Normal heart sounds, S1 normal and S2 normal. No murmur heard. Pulmonary:     Effort:  Pulmonary effort is normal. No respiratory distress.     Breath sounds: No wheezing, rhonchi or rales.  Chest:     Chest wall: No tenderness.  Abdominal:     General: Bowel sounds are normal.     Palpations: Abdomen is soft.     Tenderness: There is no abdominal tenderness. There is no guarding or rebound.  Musculoskeletal:        General: Normal range of motion.     Cervical back: Normal range of motion and neck supple. No muscular tenderness.     Right lower leg: No edema.     Left lower leg: No edema.  Skin:    General: Skin is warm and dry.     Coloration: Skin is not pale.     Findings: No rash.     Comments: Patient has poorly demarcated macular rash noted on upper extremities.  There are a few areas that appear more petechial, such as on the left wrist/forearm.  Neurological:     General: No focal deficit present.     Mental Status: She is alert. Mental status is at baseline.     Motor: No weakness.  Psychiatric:        Mood and Affect: Mood normal.     ED Results / Procedures / Treatments   Labs (all labs ordered are listed, but only abnormal results are displayed) Labs Reviewed  COMPREHENSIVE METABOLIC PANEL WITH GFR - Abnormal; Notable for the following components:      Result Value   Potassium 3.0 (*)    CO2 21 (*)    Glucose, Bld 137 (*)    All other components within normal limits  URINALYSIS, ROUTINE W REFLEX MICROSCOPIC - Abnormal; Notable for the following components:   Protein, ur 30 (*)    Leukocytes,Ua MODERATE (*)    All other components within normal limits  MAGNESIUM - Abnormal; Notable for the following components:   Magnesium 2.5 (*)    All other components within normal limits  CBC WITH DIFFERENTIAL/PLATELET  LIPASE, BLOOD  ROCKY MTN SPOTTED FVR ABS PNL(IGG+IGM)  LYME DISEASE SEROLOGY W/REFLEX    EKG EKG Interpretation Date/Time:  Monday Oct 30 2023 18:29:44 EDT Ventricular Rate:  68 PR Interval:  189 QRS Duration:  84 QT  Interval:  438 QTC Calculation: 466 R Axis:   88  Text Interpretation: Sinus rhythm Borderline right axis deviation Confirmed by Abner Hoffman [phone]) on 10/30/2023 6:46:21 PM  Radiology No results found.  Procedures Procedures    Medications  Ordered in ED Medications  sodium chloride  0.9 % bolus 1,000 mL (has no administration in time range)    ED Course/ Medical Decision Making/ A&P    Patient seen and examined. History obtained directly from patient and family member at bedside.  Labs/EKG: Ordered CBC, CMP, lipase, UA, magnesium.  Will send for RMSF and Lyme titers given rash.  EKG.  Imaging: None ordered  Medications/Fluids: Ordered: Fluid bolus, Zofran .   Most recent vital signs reviewed and are as follows: BP 127/84   Pulse 72   Temp 97.6 F (36.4 C) (Oral)   Resp 18   Ht 5\' 9"  (1.753 m)   Wt 47.6 kg   SpO2 100%   BMI 15.51 kg/m   Initial impression: Fatigue, nausea.  Patient had headache however this has resolved.  Nonspecific rash.  7:34 PM Reassessment performed. Patient appears comfortable, stable, receiving IV fluids.  Labs personally reviewed and interpreted including: CBC unremarkable; CMP with potassium 3.0, glucose 137 otherwise unremarkable; lipase normal; magnesium 2.5.  Awaiting UA.  Nonischemic EKG.  Reviewed pertinent lab work and imaging with patient at bedside. Questions answered.   Most current vital signs reviewed and are as follows: BP 127/84   Pulse 72   Temp 97.6 F (36.4 C) (Oral)   Resp 18   Ht 5\' 9"  (1.753 m)   Wt 47.6 kg   SpO2 100%   BMI 15.51 kg/m   Plan: Will continue hydration, oral repletion ordered.  Suspect patient will be able to be discharged after IV fluids and UA.  9:56 PM Reassessment performed. Patient appears stable.  Labs personally reviewed and interpreted including: UA with 6-10 white blood cells and moderate leukocyte esterase.  Patient does report having some mild dysuria.  Reviewed pertinent lab work  and imaging with patient at bedside. Questions answered.  We discussed that the UA is not overly compelling for infection, however given no other obvious cause of her fatigue and mild dysuria, would consider treating.  Will give 5 days of Macrobid.  Most current vital signs reviewed and are as follows: BP 116/76   Pulse 62   Temp 97.8 F (36.6 C) (Oral)   Resp 17   Ht 5\' 9"  (1.753 m)   Wt 47.6 kg   SpO2 99%   BMI 15.51 kg/m   Plan: Discharge to home.   Prescriptions written for: Macrobid, Zofran   Other home care instructions discussed: Rest, hydration  ED return instructions discussed: Worsening symptoms occluding fever, vomiting, respiratory symptoms  Follow-up instructions discussed: Patient encouraged to follow-up with their PCP in 3 days.                                 Medical Decision Making Amount and/or Complexity of Data Reviewed Labs: ordered.  Risk Prescription drug management.   Patient with generalized fatigue and some skin changes.  Overall workup demonstrates only mild hypokalemia, normal renal function.  Patient does have a few white blood cells and moderate leuk esterase on her UA.  EKG without arrhythmia or signs of ischemia.  Magnesium mildly elevated.  Patient does seem to be improved a bit with fluids.  She is comfortable discharge home.  I do encourage PCP follow-up for recheck in a few days.  RMSF and Lyme titers pending.  Likely low risk given rash and other unexplained symptoms, feel that it is reasonable to check these.  The patient's vital signs, pertinent lab work  and imaging were reviewed and interpreted as discussed in the ED course. Hospitalization was considered for further testing, treatments, or serial exams/observation. However as patient is well-appearing, has a stable exam, and reassuring studies today, I do not feel that they warrant admission at this time. This plan was discussed with the patient who verbalizes agreement and comfort with this  plan and seems reliable and able to return to the Emergency Department with worsening or changing symptoms.          Final Clinical Impression(s) / ED Diagnoses Final diagnoses:  Fatigue, unspecified type  Acute cystitis without hematuria  Hypokalemia    Rx / DC Orders ED Discharge Orders          Ordered    nitrofurantoin, macrocrystal-monohydrate, (MACROBID) 100 MG capsule  2 times daily        10/30/23 2153    ondansetron  (ZOFRAN -ODT) 4 MG disintegrating tablet  Every 8 hours PRN        10/30/23 2153              Lyna Sandhoff, PA-C 10/30/23 2202    Carin Charleston, MD 10/30/23 2255

## 2023-10-30 NOTE — ED Notes (Signed)
 Pt is aware urine needs to be collected. Pt will provide sample when able.

## 2023-11-01 LAB — LYME DISEASE SEROLOGY W/REFLEX: Lyme Total Antibody EIA: NEGATIVE

## 2023-12-08 ENCOUNTER — Other Ambulatory Visit: Payer: Self-pay | Admitting: Gastroenterology

## 2024-01-05 ENCOUNTER — Emergency Department (HOSPITAL_BASED_OUTPATIENT_CLINIC_OR_DEPARTMENT_OTHER)
Admission: EM | Admit: 2024-01-05 | Discharge: 2024-01-05 | Disposition: A | Discharge status: Home / Self Care | Source: Ambulatory Visit | Attending: Emergency Medicine | Admitting: Emergency Medicine

## 2024-01-05 ENCOUNTER — Other Ambulatory Visit: Payer: Self-pay

## 2024-01-05 ENCOUNTER — Encounter (HOSPITAL_BASED_OUTPATIENT_CLINIC_OR_DEPARTMENT_OTHER): Payer: Self-pay

## 2024-01-05 DIAGNOSIS — Z23 Encounter for immunization: Secondary | ICD-10-CM | POA: Diagnosis not present

## 2024-01-05 DIAGNOSIS — Z203 Contact with and (suspected) exposure to rabies: Secondary | ICD-10-CM | POA: Insufficient documentation

## 2024-01-05 DIAGNOSIS — Z2914 Encounter for prophylactic rabies immune globin: Secondary | ICD-10-CM | POA: Insufficient documentation

## 2024-01-05 MED ORDER — RABIES VACCINE, PCEC IM SUSR
1.0000 mL | Freq: Once | INTRAMUSCULAR | Status: AC
  Administered 2024-01-05: 1 mL via INTRAMUSCULAR
  Filled 2024-01-05: qty 1

## 2024-01-05 MED ORDER — RABIES IMMUNE GLOBULIN 300 UNIT/2ML IJ SOLN
20.0000 [IU]/kg | Freq: Once | INTRAMUSCULAR | Status: AC
  Administered 2024-01-05: 900 [IU] via INTRAMUSCULAR
  Filled 2024-01-05: qty 2

## 2024-01-05 NOTE — ED Triage Notes (Signed)
 Pt reports hearing fluttering in room x3 days ago. Pt reports seeing a bat in their room/house. Pt denies any direct contact with bat. Pt reports bat is out of house.

## 2024-01-05 NOTE — ED Provider Notes (Signed)
 [place] EM[facility]GENCY DEPARTMENT AT [facility] Provider Note   CSN: [id number] Arrival date & time: 01/05/24  8057     Patient presents with: Rabies Injection   Angelica Stone is a 68 y.o. female.   Patient is a 68 year old female presenting here today to get a rabies vaccine.  She reports that there has been a bat in her house since Tuesday.  They finally think they got the bat out of the house but they have been sleeping in the home with the bat.  They are not sure of any direct contact.  However they spoke with her doctor who sent them here to receive the vaccination.  She has no wounds that she is aware of.  No bites or scratches.   The history is provided by the patient.       Prior to Admission medications   Medication Sig Start Date End Date Taking? Authorizing Provider  Cholecalciferol (VITAMIN D3) 125 MCG (5000 UT) CAPS Take 5,000 Units by mouth daily.     [provider]  citalopram  (CELEXA ) 40 MG tablet Take 40 mg by mouth daily.    [provider]  cyanocobalamin  2000 MCG tablet Take 2,000 mcg by mouth daily.    [provider]  levothyroxine  (SYNTHROID ) 150 MCG tablet Take 150 mcg by mouth every morning. 05/14/22   [provider]  Multiple Vitamin (MULTIVITAMIN) tablet Take 1 tablet by mouth daily.    [provider]  nitrofurantoin , macrocrystal-monohydrate, (MACROBID ) 100 MG capsule Take 1 capsule (100 mg total) by mouth 2 (two) times daily. 10/30/23   Geiple, Joshua, PA-C  nitroGLYCERIN  (NITROSTAT ) 0.4 MG SL tablet Place 1 tablet (0.4 mg total) under the tongue every 5 (five) minutes x 3 doses as needed for chest pain (if no relief after 3rd dose, proceed to ED or call 911). Patient not taking: Reported on 08/29/2023 05/18/22   Mallipeddi, Vishnu P, MD  ondansetron  (ZOFRAN -ODT) 4 MG disintegrating tablet Take 1 tablet (4 mg total) by mouth every 8 (eight) hours as needed for nausea or vomiting. 10/30/23   Desiderio Chew, PA-C  pantoprazole  (PROTONIX ) 40 MG tablet Take 1 tablet (40 mg total) by mouth daily. 07/21/23   McMichael, Nestor HERO, PA-C  pantoprazole  (PROTONIX ) 40 MG tablet Take 1 tablet (40 mg total) by mouth daily. 08/29/23   Nandigam, Kavitha V, MD  rosuvastatin (CRESTOR) 20 MG tablet 1 tablet Orally Once a day for 90 days for cholesterol    [provider]  topiramate  (TOPAMAX ) 100 MG tablet Take 100 mg by mouth 2 (two) times daily.    [provider]  ZOLMitriptan (ZOMIG) 2.5 MG tablet Take 2.5 mg by mouth once. May repeat in 2 hours if headache persists or recurs.    [provider]    Allergies: Sulfa antibiotics    Review of Systems  Updated Vital Signs BP 102/66 (BP Location: Right Arm)   Pulse 72   Temp 98.2 F (36.8 C) (Oral)   Resp 17   Ht 5' 9 (1.753 m)   Wt 49.4 kg   SpO2 99%   BMI 16.10 kg/m   Physical Exam Vitals and nursing note reviewed.  HENT:     Head: Normocephalic.  Cardiovascular:     Rate and Rhythm: Normal rate.  Pulmonary:     Effort: Pulmonary effort is normal.  Skin:    General: Skin is warm and dry.  Neurological:     Mental Status: She is alert. Mental  status is at baseline.     (all labs ordered are listed, but only abnormal results are displayed) Labs Reviewed - No data to display  EKG: None  Radiology: No results found.   Procedures   Medications Ordered in the ED  rabies immune globulin (HYPERRAB) injection 900 Units (has no administration in time range)  rabies vaccine (RABAVERT) injection 1 mL (has no administration in time range)                                    Medical Decision Making Risk Prescription drug management.   Patient here for rabies vaccine after being in a house for the last several days with a bat.  The bat is now gone as far as they know but they did not capture the bat and it cannot be tested.  Patient given immunoglobulin and rabies vaccine and will return for the rest of  the series.     Final diagnoses:  Rabies exposure    ED Discharge Orders     None          Doretha Folks, MD 01/05/24 2111

## 2024-01-05 NOTE — Discharge Instructions (Addendum)
 You need to return to the ER 8/3, 8/8 and 8/15 for the rest of the rabies series.

## 2024-01-08 ENCOUNTER — Encounter (HOSPITAL_COMMUNITY): Payer: Self-pay

## 2024-01-08 ENCOUNTER — Ambulatory Visit (HOSPITAL_COMMUNITY)
Admission: EM | Admit: 2024-01-08 | Discharge: 2024-01-08 | Disposition: A | Discharge status: Home / Self Care | Attending: Family Medicine | Admitting: Family Medicine

## 2024-01-08 DIAGNOSIS — Z23 Encounter for immunization: Secondary | ICD-10-CM

## 2024-01-08 DIAGNOSIS — Z203 Contact with and (suspected) exposure to rabies: Secondary | ICD-10-CM

## 2024-01-08 MED ORDER — RABIES VACCINE, PCEC IM SUSR
INTRAMUSCULAR | Status: AC
  Filled 2024-01-08: qty 1

## 2024-01-08 MED ORDER — RABIES VACCINE, PCEC IM SUSR
1.0000 mL | Freq: Once | INTRAMUSCULAR | Status: AC
  Administered 2024-01-08: 1 mL via INTRAMUSCULAR

## 2024-01-08 NOTE — ED Triage Notes (Signed)
 Patient is requesting Rabavert  2nd dose.

## 2024-01-12 ENCOUNTER — Encounter (HOSPITAL_COMMUNITY): Payer: Self-pay

## 2024-01-12 ENCOUNTER — Ambulatory Visit (HOSPITAL_COMMUNITY)
Admission: EM | Admit: 2024-01-12 | Discharge: 2024-01-12 | Disposition: A | Discharge status: Home / Self Care | Attending: Family Medicine | Admitting: Family Medicine

## 2024-01-12 DIAGNOSIS — Z203 Contact with and (suspected) exposure to rabies: Secondary | ICD-10-CM | POA: Diagnosis not present

## 2024-01-12 DIAGNOSIS — Z23 Encounter for immunization: Secondary | ICD-10-CM | POA: Diagnosis not present

## 2024-01-12 MED ORDER — RABIES VACCINE, PCEC IM SUSR
INTRAMUSCULAR | Status: AC
  Filled 2024-01-12: qty 1

## 2024-01-12 MED ORDER — RABIES VACCINE, PCEC IM SUSR
1.0000 mL | Freq: Once | INTRAMUSCULAR | Status: AC
  Administered 2024-01-12: 1 mL via INTRAMUSCULAR

## 2024-01-12 NOTE — ED Triage Notes (Signed)
 Patient presents for her rabies injection #3. Patient tolerated well. NDC # [id number]

## 2024-01-19 ENCOUNTER — Other Ambulatory Visit: Payer: Self-pay

## 2024-01-19 ENCOUNTER — Ambulatory Visit (HOSPITAL_COMMUNITY)
Admission: EM | Admit: 2024-01-19 | Discharge: 2024-01-19 | Disposition: A | Discharge status: Home / Self Care | Attending: Family Medicine | Admitting: Family Medicine

## 2024-01-19 VITALS — BP 97/66 | HR 76 | Temp 97.8°F | Resp 20

## 2024-01-19 DIAGNOSIS — Z203 Contact with and (suspected) exposure to rabies: Secondary | ICD-10-CM | POA: Diagnosis not present

## 2024-01-19 DIAGNOSIS — Z23 Encounter for immunization: Secondary | ICD-10-CM | POA: Diagnosis not present

## 2024-01-19 MED ORDER — RABIES VACCINE, PCEC IM SUSR
INTRAMUSCULAR | Status: AC
  Filled 2024-01-19: qty 1

## 2024-01-19 MED ORDER — RABIES VACCINE, PCEC IM SUSR
1.0000 mL | Freq: Once | INTRAMUSCULAR | Status: AC
  Administered 2024-01-19: 1 mL via INTRAMUSCULAR

## 2024-01-19 NOTE — ED Triage Notes (Signed)
 PT presents for last rabies vaccine.

## 2024-03-05 ENCOUNTER — Other Ambulatory Visit: Payer: Self-pay | Admitting: Family Medicine

## 2024-03-05 DIAGNOSIS — G43909 Migraine, unspecified, not intractable, without status migrainosus: Secondary | ICD-10-CM

## 2024-03-05 DIAGNOSIS — R2689 Other abnormalities of gait and mobility: Secondary | ICD-10-CM

## 2024-03-05 DIAGNOSIS — R634 Abnormal weight loss: Secondary | ICD-10-CM

## 2024-03-12 ENCOUNTER — Encounter (HOSPITAL_BASED_OUTPATIENT_CLINIC_OR_DEPARTMENT_OTHER): Payer: Self-pay | Admitting: Emergency Medicine

## 2024-03-12 ENCOUNTER — Emergency Department (HOSPITAL_BASED_OUTPATIENT_CLINIC_OR_DEPARTMENT_OTHER)

## 2024-03-12 ENCOUNTER — Emergency Department (HOSPITAL_BASED_OUTPATIENT_CLINIC_OR_DEPARTMENT_OTHER)
Admission: EM | Admit: 2024-03-12 | Discharge: 2024-03-12 | Disposition: A | Discharge status: Home / Self Care | Attending: Emergency Medicine | Admitting: Emergency Medicine

## 2024-03-12 DIAGNOSIS — R519 Headache, unspecified: Secondary | ICD-10-CM | POA: Diagnosis present

## 2024-03-12 DIAGNOSIS — E039 Hypothyroidism, unspecified: Secondary | ICD-10-CM | POA: Diagnosis not present

## 2024-03-12 DIAGNOSIS — G44209 Tension-type headache, unspecified, not intractable: Secondary | ICD-10-CM | POA: Insufficient documentation

## 2024-03-12 LAB — CBC WITH DIFFERENTIAL/PLATELET
Abs Immature Granulocytes: 0.01 K/uL (ref 0.00–0.07)
Basophils Absolute: 0 K/uL (ref 0.0–0.1)
Basophils Relative: 1 %
Eosinophils Absolute: 0.1 K/uL (ref 0.0–0.5)
Eosinophils Relative: 1 %
HCT: 41.9 % (ref 36.0–46.0)
Hemoglobin: 14.2 g/dL (ref 12.0–15.0)
Immature Granulocytes: 0 %
Lymphocytes Relative: 41 %
Lymphs Abs: 2.1 K/uL (ref 0.7–4.0)
MCH: 32.7 pg (ref 26.0–34.0)
MCHC: 33.9 g/dL (ref 30.0–36.0)
MCV: 96.5 fL (ref 80.0–100.0)
Monocytes Absolute: 0.4 K/uL (ref 0.1–1.0)
Monocytes Relative: 7 %
Neutro Abs: 2.6 K/uL (ref 1.7–7.7)
Neutrophils Relative %: 50 %
Platelets: 222 K/uL (ref 150–400)
RBC: 4.34 MIL/uL (ref 3.87–5.11)
RDW: 12.2 % (ref 11.5–15.5)
WBC: 5.2 K/uL (ref 4.0–10.5)
nRBC: 0 % (ref 0.0–0.2)

## 2024-03-12 LAB — BASIC METABOLIC PANEL WITH GFR
Anion gap: 14 (ref 5–15)
BUN: 12 mg/dL (ref 8–23)
CO2: 21 mmol/L — ABNORMAL LOW (ref 22–32)
Calcium: 10.2 mg/dL (ref 8.9–10.3)
Chloride: 104 mmol/L (ref 98–111)
Creatinine, Ser: 0.74 mg/dL (ref 0.44–1.00)
GFR, Estimated: >60 mL/min (ref >60)
Glucose, Bld: 92 mg/dL (ref 70–99)
Potassium: 3.5 mmol/L (ref 3.5–5.1)
Sodium: 139 mmol/L (ref 135–145)

## 2024-03-12 MED ORDER — SODIUM CHLORIDE 0.9 % IV BOLUS
500.0000 mL | Freq: Once | INTRAVENOUS | Status: AC
  Administered 2024-03-12: 500 mL via INTRAVENOUS

## 2024-03-12 MED ORDER — PROCHLORPERAZINE EDISYLATE 10 MG/2ML IJ SOLN
10.0000 mg | Freq: Once | INTRAMUSCULAR | Status: AC
  Administered 2024-03-12: 10 mg via INTRAVENOUS
  Filled 2024-03-12: qty 2

## 2024-03-12 MED ORDER — DIPHENHYDRAMINE HCL 50 MG/ML IJ SOLN
12.5000 mg | Freq: Once | INTRAMUSCULAR | Status: AC
  Administered 2024-03-12: 12.5 mg via INTRAVENOUS
  Filled 2024-03-12: qty 1

## 2024-03-12 MED ORDER — DEXAMETHASONE SODIUM PHOSPHATE 10 MG/ML IJ SOLN
10.0000 mg | Freq: Once | INTRAMUSCULAR | Status: AC
  Administered 2024-03-12: 10 mg via INTRAVENOUS
  Filled 2024-03-12: qty 1

## 2024-03-12 MED ORDER — ACETAMINOPHEN 500 MG PO TABS
1000.0000 mg | ORAL_TABLET | Freq: Once | ORAL | Status: AC
  Administered 2024-03-12: 1000 mg via ORAL
  Filled 2024-03-12: qty 2

## 2024-03-12 MED ORDER — KETOROLAC TROMETHAMINE 15 MG/ML IJ SOLN
15.0000 mg | Freq: Once | INTRAMUSCULAR | Status: AC
  Administered 2024-03-12: 15 mg via INTRAVENOUS
  Filled 2024-03-12: qty 1

## 2024-03-12 NOTE — ED Triage Notes (Signed)
 Pt c/o frontal HA, hx of migraines x 4 days. Migraine med zomig not working

## 2024-03-12 NOTE — ED Provider Notes (Signed)
 [place] EMERGENCY DEPARTMENT AT [facility] Provider Note   CSN: [id number] Arrival date & time: 03/12/24  1017     Patient presents with: Headache   Angelica Stone is a 68 y.o. female with history of hypothyroidism, collagenous colitis, mitral valve prolapse, migraines, presents with concern for a frontal headache that has been ongoing for the past 4 days.  She reports this is a typical spot for her migraines.  She reports some light and sound sensitivity with this headache.  Denies any changes in her vision.  She denies any neck pain or fevers.  Denies any recent head trauma.  No slurred speech, facial droop, arm or leg weakness or numbness.  No dizziness.  She presents today since her home medication of Zomig not relieving her symptoms.  She also reports she has been weaned off of her budesonide  over the past 2 weeks which she was using for treatment of her collagenous colitis.  No other recent medication changes.    Headache      Prior to Admission medications   Medication Sig Start Date End Date Taking? Authorizing Provider  Cholecalciferol (VITAMIN D3) 125 MCG (5000 UT) CAPS Take 5,000 Units by mouth daily.     [provider]  citalopram  (CELEXA ) 40 MG tablet Take 40 mg by mouth daily.    [provider]  cyanocobalamin  2000 MCG tablet Take 2,000 mcg by mouth daily.    [provider]  levothyroxine  (SYNTHROID ) 150 MCG tablet Take 150 mcg by mouth every morning. 05/14/22   [provider]  Multiple Vitamin (MULTIVITAMIN) tablet Take 1 tablet by mouth daily.    [provider]  nitrofurantoin , macrocrystal-monohydrate, (MACROBID ) 100 MG capsule Take 1 capsule (100 mg total) by mouth 2 (two) times daily. 10/30/23   Desiderio Chew, PA-C  nitroGLYCERIN  (NITROSTAT ) 0.4 MG SL tablet Place 1 tablet (0.4 mg total) under the tongue every 5 (five) minutes x 3 doses as needed for chest pain (if no relief after 3rd dose, proceed to ED  or call 911). Patient not taking: Reported on 08/29/2023 05/18/22   Mallipeddi, Vishnu P, MD  ondansetron  (ZOFRAN -ODT) 4 MG disintegrating tablet Take 1 tablet (4 mg total) by mouth every 8 (eight) hours as needed for nausea or vomiting. 10/30/23   Desiderio Chew, PA-C  pantoprazole  (PROTONIX ) 40 MG tablet Take 1 tablet (40 mg total) by mouth daily. 07/21/23   McMichael, Nestor HERO, PA-C  pantoprazole  (PROTONIX ) 40 MG tablet Take 1 tablet (40 mg total) by mouth daily. 08/29/23   Nandigam, Kavitha V, MD  rosuvastatin (CRESTOR) 20 MG tablet 1 tablet Orally Once a day for 90 days for cholesterol    [provider]  topiramate  (TOPAMAX ) 100 MG tablet Take 100 mg by mouth 2 (two) times daily.    [provider]  ZOLMitriptan (ZOMIG) 2.5 MG tablet Take 2.5 mg by mouth once. May repeat in 2 hours if headache persists or recurs.    [provider]    Allergies: Sulfa antibiotics    Review of Systems  Neurological:  Positive for headaches.    Updated Vital Signs BP 113/74   Pulse 85   Temp 98.2 F (36.8 C)   Resp 18   Wt 47.6 kg   SpO2 100%   BMI 15.51 kg/m   Physical Exam Vitals and nursing note reviewed.  Constitutional:      General: She is not in acute distress.    Appearance: She is well-developed.  HENT:  Head: Normocephalic and atraumatic.     Mouth/Throat:     Mouth: Mucous membranes are dry.     Pharynx: No oropharyngeal exudate or posterior oropharyngeal erythema.  Eyes:     Extraocular Movements: Extraocular movements intact.     Conjunctiva/sclera: Conjunctivae normal.     Pupils: Pupils are equal, round, and reactive to light.  Neck:     Comments: No nuchal rigidity, bends head up and down, left and right without difficulty Cardiovascular:     Rate and Rhythm: Normal rate and regular rhythm.     Heart sounds: No murmur heard. Pulmonary:     Effort: Pulmonary effort is normal. No respiratory distress.     Breath sounds: Normal breath sounds.   Abdominal:     Palpations: Abdomen is soft.     Tenderness: There is no abdominal tenderness.  Musculoskeletal:        General: No swelling.     Cervical back: Normal range of motion and neck supple. No rigidity.  Skin:    General: Skin is warm and dry.     Capillary Refill: Capillary refill takes less than 2 seconds.  Neurological:     General: No focal deficit present.     Mental Status: She is alert and oriented to person, place, and time.  Psychiatric:        Mood and Affect: Mood normal.     (all labs ordered are listed, but only abnormal results are displayed) Labs Reviewed  BASIC METABOLIC PANEL WITH GFR - Abnormal; Notable for the following components:      Result Value   CO2 21 (*)    All other components within normal limits  CBC WITH DIFFERENTIAL/PLATELET    EKG: None  Radiology: CT Head Wo Contrast Result Date: 03/12/2024 EXAM: CT HEAD WITHOUT CONTRAST 03/12/2024 11:50:00 AM TECHNIQUE: CT of the head was performed without the administration of intravenous contrast. Automated exposure control, iterative reconstruction, and/or weight based adjustment of the mA/kV was utilized to reduce the radiation dose to as low as reasonably achievable. COMPARISON: None available. CLINICAL HISTORY: 68 year old female with increasing frequency/severity frontal headache, history of migraines x 4 days, Zomig ineffective. FINDINGS: BRAIN AND VENTRICLES: No acute hemorrhage. No evidence of acute infarct. No hydrocephalus. No extra-axial collection. No mass effect or midline shift. Cerebral volume appears normal for age. No suspicious intracranial vascular hyperdensity. Mild calcified atherosclerosis at the skull base. Overall, the non-contrast head CT is normal for age. ORBITS: No acute abnormality. SINUSES: No acute abnormality. SOFT TISSUES AND SKULL: Mild superficial skin and scalp soft tissue irregularity at the left vertex on series 3 image 70 is nonspecific. Underlying calvarium intact.  No skull fracture. IMPRESSION: 1. Normal for age non-contrast CT appearance of the brain. 2. Superficial skin / scalp soft tissue irregularity at the left vertex on series 3 image 70 is nonspecific. Correlate with physical exam Electronically signed by: Helayne Hurst MD 03/12/2024 12:37 PM EDT RP Workstation: [id number]     Procedures   Medications Ordered in the ED  ketorolac (TORADOL) 15 MG/ML injection 15 mg (15 mg Intravenous Given 03/12/24 1158)  prochlorperazine (COMPAZINE) injection 10 mg (10 mg Intravenous Given 03/12/24 1200)  diphenhydrAMINE  (BENADRYL ) injection 12.5 mg (12.5 mg Intravenous Given 03/12/24 1159)  dexamethasone  (DECADRON ) injection 10 mg (10 mg Intravenous Given 03/12/24 1204)  acetaminophen  (TYLENOL ) tablet 1,000 mg (1,000 mg Oral Given 03/12/24 1206)  sodium chloride  0.9 % bolus 500 mL (500 mLs Intravenous New Bag/Given 03/12/24 1156)  Medical Decision Making Amount and/or Complexity of Data Reviewed Labs: ordered. Radiology: ordered.  Risk OTC drugs. Prescription drug management.     Differential diagnosis includes but is not limited to Tension headache, migraine, temporal arteritis, trigeminal neuralgia, cluster headache, meningitis, concussion, intracranial mass, intracranial hemorrhage, carbon monoxide poisoning, sinus venous thrombosis   ED Course:  Upon initial evaluation, patient is well-appearing, no acute distress.  Stable vitals.  Reporting headache behind her forehead ongoing for the past 4 days.  This is consistent with previous headaches, but has not been well-controlled with her home Zomig.  No neurologic deficits on exam.  No nuchal rigidity or fevers, no concern for meningitis.  Given headache not well-controlled on typical home medications, will obtain CT head for further evaluation.  Will obtain basic labs and start on treatment with IV medications.  Labs Ordered: I Ordered, and personally interpreted labs.   The pertinent results include:   CBC within normal limits BMP within normal limits  Imaging Studies ordered: I ordered imaging studies including CT head I independently visualized the imaging with scope of interpretation limited to determining acute life threatening conditions related to emergency care. Imaging showed  IMPRESSION:  1. Normal for age non-contrast CT appearance of the brain.  2. Superficial skin / scalp soft tissue irregularity at the left vertex on  series 3 image 70 is nonspecific. Correlate with physical exam   I agree with the radiologist interpretation   Medications Given: 500 mL NS bolus Toradol Benadryl  Compazine Decadron  Tylenol   Upon re-evaluation, patient reports headache improved at about a 5 out of 10.  She reports she is feeling better than she was before and she would like to go home at this time.  Her labs are reassuring with normal CBC and BMP.  At her CT head did not show any acute abnormality.  There was a soft tissue irregularity noted in the left side of the scalp.  I did examine this area and patient scalp which did not reveal any acute abnormality such as lesion, hematoma, abscess, that would need to be addressed today.  Headaches consistent with normal headaches and improved with medications here. Low concern for emergent etiology given reassuring labs, exam, and imaging. Stable and appropriate for discharge home.    Impression: Tension headache  Disposition:  The patient was discharged home with instructions to continue taking her Zomig as prescribed.  Tylenol  and ibuprofen as needed for pain.  Follow-up with PCP within the next week for further management of her migraines. Return precautions given.   This chart was dictated using voice recognition software, Dragon. Despite the best efforts of this provider to proofread and correct errors, errors may still occur which can change documentation meaning.       Final diagnoses:  Tension  headache    ED Discharge Orders     None          Veta Palma, PA-C 03/12/24 1253    [address], DO 03/12/24 1457

## 2024-03-12 NOTE — Discharge Instructions (Addendum)
 You were seen today for your headache No specific cause was found today for your headache. It may have been a migraine or other cause of headache. Stress, anxiety, fatigue, weather changes are common triggers for headaches.   You may take up to 1000mg  of tylenol  every 6 hours as needed for headache. Do not take more then 4g per day.  You may take your next dose at 6 PM tonight  You may use up to 600mg  ibuprofen every 6 hours as needed for headache.  Do not exceed 2.4g of ibuprofen per day.  You may take your next dose at 6 PM tonight   Tests performed today include: CT of your head which was normal and did not show any serious cause of your headache  Your labs, including blood counts and electrolytes, was normal.  Follow-up instructions: Please follow-up with your primary care provider if your headaches are not being controlled with your prescribed Zomig or becoming more frequent.  Return instructions:  Please return to the Emergency Department if you experience worsening symptoms. Return if the medications do not resolve your headache, if it recurs, or if you have multiple episodes of vomiting or cannot keep down fluids. Return if you have a change from your usual headache. RETURN IMMEDIATELY IF you: Develop a sudden, severe headache Develop confusion or become poorly responsive or faint Develop a fever above 100.49F or problem breathing Have a change in speech, vision, swallowing, or understanding Develop new weakness, numbness, tingling, incoordination in your arms or legs Have a seizure Have lots of pain in your neck Please return if you have any other emergent concerns.

## 2024-03-18 ENCOUNTER — Ambulatory Visit
Admission: RE | Admit: 2024-03-18 | Discharge: 2024-03-18 | Disposition: A | Source: Ambulatory Visit | Attending: Family Medicine | Admitting: Family Medicine

## 2024-03-18 DIAGNOSIS — R634 Abnormal weight loss: Secondary | ICD-10-CM

## 2024-03-18 DIAGNOSIS — R2689 Other abnormalities of gait and mobility: Secondary | ICD-10-CM

## 2024-03-18 DIAGNOSIS — G43909 Migraine, unspecified, not intractable, without status migrainosus: Secondary | ICD-10-CM

## 2024-03-18 MED ORDER — GADOPICLENOL 0.5 MMOL/ML IV SOLN
5.0000 mL | Freq: Once | INTRAVENOUS | Status: AC | PRN
  Administered 2024-03-18: 5 mL via INTRAVENOUS

## 2024-04-01 ENCOUNTER — Other Ambulatory Visit (HOSPITAL_COMMUNITY): Payer: Self-pay | Admitting: Family Medicine

## 2024-04-01 DIAGNOSIS — Z96642 Presence of left artificial hip joint: Secondary | ICD-10-CM

## 2024-04-08 ENCOUNTER — Encounter (HOSPITAL_COMMUNITY): Payer: Self-pay

## 2024-04-08 ENCOUNTER — Encounter (HOSPITAL_COMMUNITY)
Admission: RE | Admit: 2024-04-08 | Discharge: 2024-04-08 | Disposition: A | Discharge status: Home / Self Care | Source: Ambulatory Visit | Attending: Family Medicine | Admitting: Family Medicine

## 2024-04-08 ENCOUNTER — Ambulatory Visit: Admitting: Neurology

## 2024-04-08 DIAGNOSIS — Z96642 Presence of left artificial hip joint: Secondary | ICD-10-CM | POA: Insufficient documentation

## 2024-04-08 MED ORDER — TECHNETIUM TC 99M MEDRONATE IV KIT
20.0000 | PACK | Freq: Once | INTRAVENOUS | Status: AC
  Administered 2024-04-08: 20 via INTRAVENOUS

## 2024-04-09 ENCOUNTER — Encounter: Payer: Self-pay | Admitting: Neurology

## 2024-04-09 ENCOUNTER — Ambulatory Visit (INDEPENDENT_AMBULATORY_CARE_PROVIDER_SITE_OTHER): Admitting: Neurology

## 2024-04-09 VITALS — BP 89/69 | HR 96 | Ht 69.0 in | Wt 103.0 lb

## 2024-04-09 DIAGNOSIS — R634 Abnormal weight loss: Secondary | ICD-10-CM | POA: Diagnosis not present

## 2024-04-09 DIAGNOSIS — Z681 Body mass index (BMI) 19 or less, adult: Secondary | ICD-10-CM

## 2024-04-09 DIAGNOSIS — G43019 Migraine without aura, intractable, without status migrainosus: Secondary | ICD-10-CM

## 2024-04-09 MED ORDER — NURTEC 75 MG PO TBDP: 75.0000 mg | ORAL_TABLET | ORAL | 3 refills | Status: DC

## 2024-04-09 NOTE — Patient Instructions (Addendum)
 It was nice to meet you today.   As discussed, your headaches are likely due to a combination of factors.   Here is what we discussed today and my recommendations for you:   Please remember, common headache triggers are: sleep deprivation, dehydration, overheating, stress, hypoglycemia or skipping meals and blood sugar fluctuations, excessive pain medications or excessive alcohol use or caffeine withdrawal. Some people have food triggers such as aged cheese, orange juice or chocolate, especially dark chocolate, or MSG (monosodium glutamate). Try to avoid these headache triggers as much possible. It may be helpful to keep a headache diary to figure out what makes your headaches worse or brings them on and what alleviates them. Some people report headache onset after exercise but studies have shown that regular exercise may actually prevent headaches from coming. If you have exercise-induced headaches, please make sure that you drink plenty of fluid before and after exercising and that you do not over do it and do not overheat. Please reduce your caffeine consumption and limit yourself to 1 serving or less per day on average.   Increase your water intake to about 64 ounces of water per day.   Please get an updated eye exam. You can seen any optometrist or ophthalmologist of your choosing. Strain on the eyes can exacerbate headaches. Please work with your primary care on gradually coming off of the Topamax  as you have lost quite a bit of weight and you have a history of kidney stones.  I would not recommend that you continue with Topamax  chronically.   For as needed use you can continue with the Zomig as needed through your PCP.   For migraine prevention we will start you on a new medication called Nurtec 75 mg strength: Take 1 pill every other day.  Side effects may include sedation and nausea.  We will plan a follow up in about 6 months.

## 2024-04-09 NOTE — Progress Notes (Signed)
 Subjective:    Patient ID: Angelica Stone is a 68 y.o. female.  HPI    True Mar, MD, PhD [facility] [address], [address] [address] [place], [place] [address]  Dear Dr. Gerome,  I saw your patient, Angelica Stone, upon your kind request in my neurologic clinic today for initial consultation of her recurrent headaches, in particular, history of chronic migraines.  The patient is unaccompanied today.  As you know, Angelica Stone is a 68 year old female with an underlying medical history of cholelithiasis, arthritis with status post right shoulder surgery, left hip surgeries, including left hip replacement and subsequent revision, cervical degenerative disc disease, collagenous colitis and diverticulitis, allergy, anxiety, depression, hypothyroidism, history of kidney stones, history of gallstone, mitral valve prolapse, low BMI of less than 16, and leg edema, who reports a longstanding history since childhood of chronic migraines.  She has been on multiple different medications, currently on Zomig as needed and has been on Topamax  for over 25 years.  While she has not had any problem with appetite loss or weight loss on the Topamax  for years, in the past 6 months she has had worsening migraines and also weight loss.  She attributes some of her meat loss to her teeth issues.  She had all teeth extracted and is getting permanent implants, these are still in the works.  She had 2 kidney stones in the past, none recently.  She feels that her migraines are worsening, admits that she does not eat very well.  She has up to 10 migraines per month and they can last up to 3 days.  She does not always hydrate well with water, estimates that she drinks at least 2-3 8 ounce servings per day but drinks quite a bit of [facility], 2 bottles per day, 16.9 ounce size.  She had Botox injections in the 90s and did well with these, saw headache specialist at [facility] at the time.  She has not  been on monthly subcu injectable migraine medications.  She is not sure what all she tried besides the Topamax . She does not drink any alcohol.  She quit smoking about 8 years ago.  She reports a family history of migraines in her father and paternal grandmother. She has an eye examination pending for December 2025.  I reviewed your office note from 11/02/2023.  She had blood work through your office on 11/02/2022 and I was able to review results in her paper chart.  Vitamin D level was 70.3, TSH 2.21, B12 397.  I also reviewed your office note from 06/28/2023.  Of note, she has been on topiramate  for migraine prevention but has lost quite a bit of weight and she has a history of kidney stones.  She was previously on Botox injections through another provider.  Prior neurology records are not available for my review today.  Of note, she presented to the emergency room at [facility] on 03/12/2024 with a chief complaint of headache.  I reviewed the emergency room records.  She was treated symptomatically with IV fluids, ketorolac, diphenhydramine , prochlorperazine, dexamethasone , and acetaminophen . She had a head CT without contrast on 03/12/2024 and I reviewed the results:    IMPRESSION: 1. Normal for age non-contrast CT appearance of the brain. 2. Superficial skin / scalp soft tissue irregularity at the left vertex on series 3 image 70 is nonspecific. Correlate with physical exam   In addition, I personally and independently reviewed images through the  PACS system.  She had a brain MRI with and without contrast on 03/18/2024 and I reviewed the results:  IMPRESSION: 1. No acute intracranial abnormality. 2. 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   In addition, I personally and independently reviewed images through the PACS system.  Her Past Medical History Is Significant For: Past Medical History:  Diagnosis Date   Allergy    Anxiety    Arthritis     Collagenous colitis    Depression    Gallstone    History of kidney stones    Hypothyroidism    Migraines    MVP (mitral valve prolapse)    early 20's   Pneumonia    Swelling of both ankles    Varicose veins of left leg with edema     Her Past Surgical History Is Significant For: Past Surgical History:  Procedure Laterality Date   BARTHOLIN GLAND CYST EXCISION Bilateral    CAPSULAR RELEASE Right 03/30/2020   Procedure: RIGHT SHOULDER CAPSULAR RELEASE AND MANIPULATION UNDER ANESTHESIA , INJECTION;  Surgeon: Dozier Soulier, MD;  Location: [facility];  Service: Orthopedics;  Laterality: Right;   COLONOSCOPY     HIP RESURFACING Left 2018   SALPINGECTOMY Left    TOTAL ABDOMINAL HYSTERECTOMY  2006   TOTAL HIP REVISION Left 12/30/2019   Procedure: LEFT TOTAL HIP REVISION;  Surgeon: Liam Lerner, MD;  Location: [facility];  Service: Orthopedics;  Laterality: Left;   UPPER GI ENDOSCOPY      Her Family History Is Significant For: Family History  Problem Relation Age of Onset   Heart disease Mother    Dementia Mother    Arthritis Mother    Bladder Cancer Father    Diabetes Father    Heart disease Father    Arthritis Father    Migraines Father    Diabetes Sister    Arthritis Sister    Diabetes Sister    Arthritis Sister    Arthritis Sister    Dementia Maternal Aunt        x 2   Dementia Maternal Grandmother    Heart disease Maternal Grandfather    Migraines Paternal Grandmother    Diabetes Paternal Grandfather    Colon cancer Neg Hx    Stomach cancer Neg Hx    Esophageal cancer Neg Hx    Rectal cancer Neg Hx     Her Social History Is Significant For: Social History   Socioeconomic History   Marital status: Single    Spouse name: Not on file   Number of children: 0   Years of education: Not on file   Highest education level: Not on file  Occupational History   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  Tobacco Use   Smoking status: Some Days    Current  packs/day: 0.00    Types: Cigarettes    Last attempt to quit: 02/20/2016    Years since quitting: 8.1   Smokeless tobacco: Never  Vaping Use   Vaping status: Never Used  Substance and Sexual Activity   Alcohol use: Not Currently    Comment: rarely on occasions   Drug use: Never   Sexual activity: Not on file  Other Topics Concern   Not on file  Social History Narrative   Pt lives alone    Pt works    Social Drivers of Corporate Investment Banker Strain: Not on file  Food Insecurity: Not on file  Transportation Needs: Not on file  Physical Activity:  Not on file  Stress: Not on file  Social Connections: Not on file    Her Allergies Are:  Allergies  Allergen Reactions   Sulfa Antibiotics Swelling and Rash    Swelling around mouth and throat  :   Her Current Medications Are:  Outpatient Encounter Medications as of 04/09/2024  Medication Sig   Cholecalciferol (VITAMIN D3) 125 MCG (5000 UT) CAPS Take 5,000 Units by mouth daily.    citalopram  (CELEXA ) 40 MG tablet Take 40 mg by mouth daily.   cyanocobalamin  2000 MCG tablet Take 2,000 mcg by mouth daily.   levothyroxine  (SYNTHROID ) 150 MCG tablet Take 150 mcg by mouth every morning. (Patient taking differently: Take 100 mcg by mouth every morning.)   Multiple Vitamin (MULTIVITAMIN) tablet Take 1 tablet by mouth daily.   nitrofurantoin , macrocrystal-monohydrate, (MACROBID ) 100 MG capsule Take 1 capsule (100 mg total) by mouth 2 (two) times daily.   ondansetron  (ZOFRAN -ODT) 4 MG disintegrating tablet Take 1 tablet (4 mg total) by mouth every 8 (eight) hours as needed for nausea or vomiting.   pantoprazole  (PROTONIX ) 40 MG tablet Take 1 tablet (40 mg total) by mouth daily.   pantoprazole  (PROTONIX ) 40 MG tablet Take 1 tablet (40 mg total) by mouth daily.   rosuvastatin (CRESTOR) 20 MG tablet 1 tablet Orally Once a day for 90 days for cholesterol   topiramate  (TOPAMAX ) 100 MG tablet Take 100 mg by mouth 2 (two) times daily.    ZOLMitriptan (ZOMIG) 2.5 MG tablet Take 2.5 mg by mouth once. May repeat in 2 hours if headache persists or recurs.   nitroGLYCERIN  (NITROSTAT ) 0.4 MG SL tablet Place 1 tablet (0.4 mg total) under the tongue every 5 (five) minutes x 3 doses as needed for chest pain (if no relief after 3rd dose, proceed to ED or call 911). (Patient not taking: Reported on 04/09/2024)   No facility-administered encounter medications on file as of 04/09/2024.  :   Review of Systems:  Out of a complete 14 point review of systems, all are reviewed and negative with the exception of these symptoms as listed below:  Review of Systems  Objective:  Neurological Exam  Physical Exam Physical Examination:   Vitals:   04/09/24 1001  BP: (!) 89/69  Pulse: 96   Blood pressure on the lower side, endorses a little bit of lightheadedness but no other symptoms. General Examination: The patient is a very pleasant 68 y.o. female in no acute distress. She appears well-developed and well-nourished and well groomed.   HEENT: Normocephalic, atraumatic, pupils are equal, round and reactive to light, extraocular tracking is good without limitation to gaze excursion or nystagmus noted. No photophobia.  Funduscopic exam benign, mild cataracts noted bilaterally.  Hearing is grossly intact.  Face is symmetric with normal facial animation and normal facial sensation. Speech is clear without dysarthria. There is no hypophonia. There is no lip, neck/head, jaw or voice tremor. Neck is supple with full range of passive and active motion. There are no carotid bruits on auscultation.  Airway/Oropharynx exam reveals: Moderate mouth dryness, edentulous, with implant stubs in place in the top and bottom jaw.  Tongue protrudes centrally and palate elevates symmetrically.     Chest: Clear to auscultation without wheezing, rhonchi or crackles noted.  Heart: S1+S2+0, regular and normal without murmurs, rubs or gallops noted.   Abdomen: Soft,  non-tender and non-distended.  Extremities: There is no pitting edema in the distal lower extremities bilaterally.   Skin: Warm and dry without trophic  changes noted.   Musculoskeletal: exam reveals no obvious joint deformities.   Neurologically:  Mental status: The patient is awake, alert and oriented in all 4 spheres. Her immediate and remote memory, attention, language skills and fund of knowledge are appropriate. There is no evidence of aphasia, agnosia, apraxia or anomia. Speech is clear with normal prosody and enunciation. Thought process is linear. Mood is normal and affect is normal.  Cranial nerves II - XII are as described above under HEENT exam.  Motor exam: Normal bulk, strength and tone is noted. There is no obvious action or resting tremor.  Fine motor skills and coordination: Intact with finger taps and hand movements and rapid alternating patting, normal foot taps bilaterally in the lower extremities.  Cerebellar testing: No dysmetria or intention tremor. There is no truncal or gait ataxia.  Normal finger-to-nose and normal heel-to-shin. Reflexes about 1+, toes are downgoing. Sensory exam: intact to light touch in the upper and lower extremities.  Gait, station and balance: She stands easily. No veering to one side is noted. No leaning to one side is noted. Posture is age-appropriate and stance is narrow based. Gait shows normal stride length and normal pace. No problems turning are noted.   Assessment and Plan:   In summary, Kelsha Older is a very pleasant 68 y.o.-year old female with an underlying medical history of cholelithiasis, arthritis with status post right shoulder surgery, left hip surgeries, including left hip replacement and subsequent revision, cervical degenerative disc disease, collagenous colitis and diverticulitis, allergy, anxiety, depression, hypothyroidism, history of kidney stones, history of gallstone, mitral valve prolapse, low BMI of less than 16, and  leg edema, who presents for evaluation of her chronic migraines of many years duration.  Neurological exam is nonfocal.  We talked about headache triggers and alleviating factors.  She is advised to talk to you about tapering off Topamax .  We will initiate treatment with Nurtec every other day for migraine prevention.  She is furthermore advised to stay better hydrated with water, keep her appointment for her eye examination, and reduce her caffeine intake.  Good nutrition and regular caloric intake are also important for migraine management.  She is hoping to get her implants soon.   Below is a summary of my recommendations and our discussion points from today's visit, based on chart review, history and examination. They were given these instructions verbally during the visit in detail and also in writing in the MyChart after visit summary (AVS), which they can access electronically. << Please remember, common headache triggers are: sleep deprivation, dehydration, overheating, stress, hypoglycemia or skipping meals and blood sugar fluctuations, excessive pain medications or excessive alcohol use or caffeine withdrawal. Some people have food triggers such as aged cheese, orange juice or chocolate, especially dark chocolate, or MSG (monosodium glutamate). Try to avoid these headache triggers as much possible. It may be helpful to keep a headache diary to figure out what makes your headaches worse or brings them on and what alleviates them. Some people report headache onset after exercise but studies have shown that regular exercise may actually prevent headaches from coming. If you have exercise-induced headaches, please make sure that you drink plenty of fluid before and after exercising and that you do not over do it and do not overheat. Please reduce your caffeine consumption and limit yourself to 1 serving or less per day on average.   Increase your water intake to about 64 ounces of water per day.   Please  get an updated eye exam. You can seen any optometrist or ophthalmologist of your choosing. Strain on the eyes can exacerbate headaches. Please work with your primary care on gradually coming off of the Topamax  as you have lost quite a bit of weight and you have a history of kidney stones.  I would not recommend that you continue with Topamax  chronically.   For as needed use you can continue with the Zomig as needed through your PCP.   For migraine prevention we will start you on a new medication called Nurtec 75 mg strength: Take 1 pill every other day.  Side effects may include sedation and nausea.  We will plan a follow up in about 6 months. >>     Thank you very much for allowing me to participate in the care of this nice patient. If I can be of any further assistance to you please do not hesitate to call me at [phone].  Sincerely,   True Mar, MD, PhD

## 2024-04-12 NOTE — H&P (Signed)
  Cardiac Catheterization Laboratory [place], [place] Tel: [phone]    Fax: [phone]    HISTORY & PHYSICAL ASSESSMENT  PCP: Gerome Zenon Ruth, [facility] Phone:  [phone] Fax:  [phone]  Referring Physicians: Cherie Ardeen Hanger, Md [address] [place],  [place] [address]   Primary Cardiologist: N/a   Procedures to be performed: Coronary angiography, Possible Percutaneous Coronary Intervention, Possible Left Heart Catheterization, Possible Left Ventriculogram  Indication: NSTEMI  Consent: I hereby certify that the nature, purpose, benefits, usual and most frequent risks of, and alternatives to, the operation or procedure have been explained to the patient (or person authorized to sign for the patient) either by a physician or by the provider who is to perform the operation or procedure, that the patient has had an opportunity to ask questions, and that those questions have been answered. The patient or the patient's representative has been advised that selected tasks may be performed by assistants to the primary health care provider(s). I believe that the patient (or person authorized to sign for the patient) understands what has been explained, and has consented to the operation or procedure. _____________________________________________________________________  HISTORY:   68y/o F hx of ongoing tobacco abuse (smokes ~ 1pack per week x 25 years), MVP , anxiety presents to ED Today for substernal chest pain that woke her up from sleep. She continued to have chest pain in the ED , ultimately relieved after aspirin  load, heparin and nitroglycerin . ECG with ST depressions inferiorly and laterally. She reports mostly resolved chest pain at this time. Initial troponin elevated at 368 then rising to 8K. No recent troponin , A1c, lipids on file. Cr 0.8 this morning. Had a normal SPECT stress test in 2023.    Assessments: ECG :  abnormal inferior and lateral ST depressions ,  possible biatrial enlargement     Medications Administered : (pre-procedure) Aspirin  Heparin  Nitroglycerin    Medications Contraindicated :  None documented  Visit Vitals BP 106/78  Pulse 65  Resp 13  Ht 175.3 cm (5' 9)  Wt 47.2 kg (104 lb)  SpO2 100%  BMI 15.36 kg/m   General: Alert, NAD HEENT: Sclera anicteric Cardiac: normal rate, regular rhythm. murmur Pulmonary: symmetric chest rise, no increased WOB Extremities: No LE edema   Labs and imaging were reviewed  The patient's estimated bleeding risk is 2.3%.   Strategies used to mitigate risk include: RRA access

## 2024-04-13 NOTE — BH Treatment Plan (Signed)
 Atrial Tachycardia   Normal Sinus Rhythm   Telemetry HR Graph

## 2024-04-14 NOTE — Progress Notes (Signed)
 ------------------------------------------------------------------------------- Attestation signed by Marvell Augustin CROME, MD at 04/15/24 1405 I saw and evaluated the patient, participating in the key portions of the service.  I reviewed the resident's note.  I agree with the resident's findings and plan.  Augustin Marvell, MD, [facility] Assistant Professor of Medicine [facility] Division of Cardiology  -------------------------------------------------------------------------------  Cardiology - Team 1 [facility]) Progress Note  Assessment & Plan:  Angelica Stone is a 68 y.o. female who is presenting to [facility] with NSTEMI (non-ST elevated myocardial infarction) (CMS-HCC), in the setting of the following pertinent/contributing co-morbidities: hypothyroidism, collagenous colitis, TUD, anxiety, GERD, migraines, and HLD.   Principal Problem:   NSTEMI (non-ST elevated myocardial infarction) (CMS-HCC) Active Problems:   Thyroid  disease   Anxiety   H/O mitral valve prolapse  Active Problems  NSTEMI, now s/p PCI to LAD Presented to OSH ED on 11/7 with CP that woke her from sleep. Initial hsTrop 368, rose to 7900 and then peaked at [address]. EKG showed ST depressions inferiorly and laterally. Received ASA load, nitroglycerin , and heparin gtt and transferred to [facility] for LHC. LHC showed significant 1-vessel CAD with 100% occluded LAD, which was sucessfully treated with DES to LAD. S/p ASA 325mg  and loaded with Ticagrelor 180mg  x1. Patient started on DAPT with ASA 81 mg daily and Brilinta 90 mg PO BID. Risk factors include TUD, family history, hx HLD. Previously had NM SPECT (05/2022) with no evidence of ischemia. Risk stratification labs notable for LDL 59, TSH 0.192, A1c 5.5%.  - Antiplatelet: ASA 81 mg indefinitely, ticagrelor 90 mg BID - Statin: Atorva 80 mg daily  - SLN PRN for chest pain - [place] as needed to maintain O2 sat>90% - Telemetry - Cardiac Rehab referral at discharge  New onset HFrEF TTE on 04/13/24 showed EF of  30-35%, from prior EF 55-60% in 2024. Pro-BNP elevated at 1,265. GDMT will be limited by hypotension - Continue metoprolol succinate 25 mg daily - Hold off on initiation of ARB/ARNI, MRA, and SGLT2 iso hypotension  Concern for small apical thrombus TTE cannot exclude small apical LV mural thrombus. Will want to re-evaluate prior to starting long-term anticoagulation, in part because initiating a DOAC would require switching from Brilinta to Plavix.  - Continue therapeutic lovenox  - Repeat TTE vs cardiac CT on 11/10 - Pharmacy to send test claim for apixaban   Ectopy  Prolonged Qtc Patient with multiple runs of VT as well as atrial tachycardia noted on telemetry during admission. Endorsed shortness of breath during episodes, no chest pain or palpitations. QtcF prolonged to 521 on EKG from 11/7, persistently prolonged at 503 on 11/9. Suspect VT related to repolarization following acute MI. In this setting, will need to stop home Celexa . - Continue metoprolol succinate 25 mg PO daily  - Stop Celexa   - Avoid QTC-prolonging agents  - Repeat EKG daily to follow QTc - Telemetry - Replete K >4, Mag >2   Orthostatic hypotension Patient reports lightheadedness upon standing. Demonstrated orthostatic hypotension on vitals on 11/8 and 11/9. Suspect related to poor PO intake. Of note, patient's BP at home usually runs in SBP upper 80s to 90s.  - 500 mL LR - Encouraged increased PO intake - Vitals q4h - Checked AM cortisol to screen for adrenal insufficiency: wnl at 12.2  Migraines Patient with history of migraines, and has had a prolonged migraine during this admission. Her prior home regimen was Topamax  BID and zolmitriptan prn, but triptans are contraindicated in CAD. - Oxycodone  2.5 mg PRN - Tylenol  650  mg q6h prn - Hold zolmitriptan as triptans contraindicated in CAD - Continue home topiramate  100 mg BID  Unintentional weight loss  BMI 14 Patient endorses unintentional 25 lb weight loss  over last year. Attributes this to dental problems, as she had all of her teeth removed about 10 months ago, which makes it challenging to eat. Currently has temporary dentures and is working toward getting permanent dentures. Denies new cough, abdominal pain, night sweats, endorses fatigue. Chart review shows that she is UTD on colonoscopy and mammogram, and has also had a recent MRI brain without findings concerning for malignancy. - Nutrition consult placed, appreciate recs  - Continue home Vitamin D3, Vitamin B12, and multivitamin   Macrocytosis Folate deficiency MCV elevated at 96. Folate level insufficent at 5.0. Vitamin B12 level is adequate - Start PO folic acid  supplementation  The patient's presentation is complicated by the following clinically significant conditions requiring additional evaluation and treatment: - Malnutrition POA requiring further investigation, treatment, or monitoring - Bleeding in the setting of medications including at least one of the following: DOAC, warfarin, clopidogrel, dual antiplatelet therapy, or other anticoagulant therapy causing medication-induced coagulopathy POA requiring further treatment, investigation or monitoring  Issues Impacting Complexity of Management: <redacted file path> -The patient is at high risk of complications from post-MI  Chronic Problems  Collagenous Colitis Per patient, had a colonoscopy earlier this year [facility]) that was improved from prior. Previously took budesonide , not currently taking any medication.    Tobacco use disorder Has smoked 1 pack per day for 25 years.  - Tobacco cessation consult placed   Anxiety - Stopped home citalopram  iso Qtc prolongation - May be able to restart citalopram  in the outpatient setting in the future, with close EKG monitoring.    Hypothyroidism TSH 0.192 (L), T3/T4 WNL. Consider repeat TSH and titration of levothyroxine  as indicated.  - Continue home levothyroxine  100 mcg     GERD - Continue home pantoprazole  40 mg daily   HLD Previously took rosuvastatin 20 mg outpatient - Increase intensity to atorvastatin 80 mg as above.  Daily Checklist: Diet: Regular Diet DVT PPx: Patient Already on Full Anticoagulation with therapeautic dosing of lovenox  Electrolytes: Replete Potassium to >/=4 and Magnesium to >/=2 Code Status: Full Code Dispo: Floor  Team Contact Information:  Primary Team: Cardiology - Team 1 (MEDC1) Primary Resident: Richardson VEAR Sanes, MD Resident's Pager: [phone] (Cardiology Team 1 Intern)  Interval History:   No acute events overnight. The patient reports that she started to feel palpitations and SOB at about 4 AM this morning, but no further episodes of NSVT were observed. She also felt lightheaded and dizzy upon standing this morning. Her migraine continues to bother her, but she has had some improvement with oxycodone .  Discussed new onset heart failure with the patient and her sisters, including that GDMT is limited by hypotension. The patient notes that she has felt SOB when laying flat for her entire life.    Objective:  Temp:  [36.3 C (97.3 F)-36.8 C (98.2 F)] 36.3 C (97.3 F) Pulse:  [59-81] 72 Resp:  [18-19] 18 BP: (66-91)/(50-57) 91/54 SpO2:  [98 %-100 %] 98 %,  Intake/Output Summary (Last 24 hours) at 04/14/2024 2222 Last data filed at 04/14/2024 1745 Gross per 24 hour  Intake --  Output 1250 ml  Net -1250 ml   Gen: Cachectic, NAD, converses  HENT: Atraumatic, normocephalic, edentulous Heart: RRR Lungs: CTA bilaterally Abdomen: Soft, NTND Extremities: No edema, warm extremities. Neuro: Grossly symmetric, non-focal  Skin:  No rashes, lesions on clothed exam Psych: Alert, oriented

## 2024-04-16 NOTE — Care Plan (Signed)
 Shift Summary Cardiac monitoring was maintained and heart rate and rhythm remained within expected parameters throughout the shift.   Skin protection and pressure reduction interventions were implemented, with no new skin issues documented.   GI pathogen panel and C. difficile assay were negative, and stool output was minimal.   Ambulation to the bathroom was achieved with minimal assistance, and activities of daily living were supported.   Overall, the shift was stable with no significant changes in cardiac, skin, or gastrointestinal status.  Stable Heart Rate and Rhythm: Heart rate and rhythm remained within expected parameters throughout the shift, with cardiac monitoring in place and no significant changes noted in rhythm or heart sounds.  Improved Ability to Complete Activities of Daily Living: Minimal assistance was required for hygiene and bathing, and ambulation to the bathroom was achieved during the shift, with occasional walking and no limitation in mobility documented.  Improved Nutritional Intake: Nutrition was probably inadequate, but independent feeding with setup assistance was maintained; no changes in intake were documented during the shift.  Skin Health and Integrity: Skin protection interventions such as pressure-redistributing mattress, frequent weight shifts, and limited adhesive use were implemented, with skin noted as thin and some loss of subcutaneous tissue; no new skin issues were documented.  Effective Diarrhea Management: Stool output was minimal and GI pathogen panel and C. difficile assay were negative; no new symptoms or interventions for diarrhea were required during the shift.

## 2024-04-18 ENCOUNTER — Telehealth (HOSPITAL_COMMUNITY): Payer: Self-pay

## 2024-04-18 NOTE — Telephone Encounter (Signed)
 Received an outside referral from [facility] for pt to participate in the cardiac rehab program. Pt sees Dr. Zenaida on 11/21 I advised pt to get Dr. Zenaida to place that cardiac rehab referral. Pt stated that she wanted to wait anyway to see what Dr. Zenaida wanted her to do.

## 2024-04-26 ENCOUNTER — Ambulatory Visit (HOSPITAL_COMMUNITY)
Admission: RE | Admit: 2024-04-26 | Discharge: 2024-04-26 | Disposition: A | Discharge status: Home / Self Care | Source: Ambulatory Visit | Attending: Cardiology | Admitting: Cardiology

## 2024-04-26 ENCOUNTER — Other Ambulatory Visit (HOSPITAL_COMMUNITY): Payer: Self-pay | Admitting: Cardiology

## 2024-04-26 ENCOUNTER — Encounter (HOSPITAL_COMMUNITY): Payer: Self-pay | Admitting: Cardiology

## 2024-04-26 VITALS — BP 96/64 | HR 66 | Ht 69.0 in | Wt 104.4 lb

## 2024-04-26 DIAGNOSIS — I5022 Chronic systolic (congestive) heart failure: Secondary | ICD-10-CM

## 2024-04-26 DIAGNOSIS — R0600 Dyspnea, unspecified: Secondary | ICD-10-CM | POA: Diagnosis not present

## 2024-04-26 DIAGNOSIS — R002 Palpitations: Secondary | ICD-10-CM | POA: Diagnosis present

## 2024-04-26 DIAGNOSIS — Z79899 Other long term (current) drug therapy: Secondary | ICD-10-CM | POA: Insufficient documentation

## 2024-04-26 DIAGNOSIS — R0602 Shortness of breath: Secondary | ICD-10-CM | POA: Insufficient documentation

## 2024-04-26 DIAGNOSIS — I2510 Atherosclerotic heart disease of native coronary artery without angina pectoris: Secondary | ICD-10-CM | POA: Diagnosis not present

## 2024-04-26 DIAGNOSIS — I252 Old myocardial infarction: Secondary | ICD-10-CM | POA: Diagnosis not present

## 2024-04-26 DIAGNOSIS — Z7902 Long term (current) use of antithrombotics/antiplatelets: Secondary | ICD-10-CM | POA: Diagnosis not present

## 2024-04-26 MED ORDER — PRASUGREL HCL 10 MG PO TABS: ORAL_TABLET | ORAL | 3 refills | Status: AC

## 2024-04-26 NOTE — Progress Notes (Signed)
   ADVANCED HEART FAILURE NEW PATIENT CLINIC NOTE  Referring Physician: Gerome Brunet, DO  Primary Care: Gerome Brunet, DO Primary Cardiologist:  HPI: Angelica Stone is a 68 y.o. female with a PMH of hypothyroidism, colitis, anxiety, migraines who presents for initial visit for further evaluation and treatment of heart failure/cardiomyopathy.      Presented 04/2024 to [facility] with chest pain that woke her from sleep, found to have LAD occlusion, treated with PCI. Loaded on aspirin  and ticagrelor, initial echo with concern for LV thrombus, gone on repeat. Orthostatic during admission, given fluids during admit. Course complicated by watery diarrhea, GI consulted.   LVEF 30-35% at discharge.     SUBJECTIVE: Patient reports overall feeling fair since discharge.  Her diarrhea has significantly improved.  Denies any lower extremity swelling or orthopnea, she does report that she has been short of breath, remarking that it has been difficult to catch her breath recently.  She does drink some caffeine but has limited things recently.  Denies any bleeding episodes, dizziness, light headedness, syncopal episodes.  She has been having ongoing palpitations, which have been associated with shortness of breath and have been lasting for some time.  PMH, current medications, allergies, social history, and family history reviewed in epic.  PHYSICAL EXAM: Vitals:   04/26/24 1104  BP: 96/64  Pulse: 66  SpO2: 100%   GENERAL: Frail PULM:  Normal work of breathing, clear to auscultation bilaterally. Respirations are unlabored.  CARDIAC:  JVP: Flat         Normal rate with regular rhythm. No murmurs, rubs or gallops.  No edema. Warm and well perfused extremities. ABDOMEN: Soft, non-tender, non-distended. NEUROLOGIC: Patient is oriented x3 with no focal or lateralizing neurologic deficits.    DATA REVIEW  ECG: 10/2023: Normal sinus rhythm, normal QRS  ECHO: 06/07/2022: LVEF 55 to 60%, relatively normal  echocardiogram 04/2024: From Care Everywhere, LVEF 30 to 35%, anterior wall motion abnormalities  CATH: Prior normal SPECT in 2023.  Recent cath with LAD occlusion with PCI   ASSESSMENT & PLAN:  Chronic systolic heart failure: Recent LAD infarction, anterior wall motion abnormalities noted on outside echocardiogram.  Appears euvolemic, NYHA class III but multifactorial, suspect some contribution from ticagrelor as below. - Wearing LifeVest, continue until follow-up echo - Cardiac rehab referral sent - Continue metoprolol succinate 25 mg daily - Blood pressure and low volume status limit further titration of medical therapy - No SGLT2 with history of UTI  Coronary artery disease: - Recent LAD STEMI - Given continued dyspnea despite euvolemia on exam we will trial transition to prasugrel  from ticagrelor - Load prasugrel  60 mg tomorrow, continue 10 mg daily thereafter - Stop ticagrelor after dose tonight - Continue Crestor 20 mg daily  Reported NSVT/palpitations: Also reportedly with long QT during admission, on multiple agents. - Still wearing LifeVest, no events - Given ongoing issues with palpitations we will obtain 14-day Zio patch - Discussed starting mexiletine if worsening symptoms, holding off given significant GI issues and reassuring exam currently - Labs today  Follow up in 3 months with echo  Morene Brownie, MD Advanced Heart Failure Mechanical Circulatory Support 04/28/24

## 2024-04-26 NOTE — Patient Instructions (Signed)
 Medication Changes:  STOP Ticagrelor (Brilinta)  START Prasagrel 10 mg Daily **Take 60 mg (6 tabs) first day only  Testing/Procedures:  Your provider has recommended that  you wear a Zio Patch for 14 days.  This monitor will record your heart rhythm for our review.  IF you have any symptoms while wearing the monitor please press the button.  If you have any issues with the patch or you notice a red or orange light on it please call the company at [phone].  Once you remove the patch please mail it back to the company as soon as possible so we can get the results.   Your physician has requested that you have an echocardiogram. Echocardiography is a painless test that uses sound waves to create images of your heart. It provides your doctor with information about the size and shape of your heart and how well your heart's chambers and valves are working. This procedure takes approximately one hour. There are no restrictions for this procedure. Please do NOT wear cologne, perfume, aftershave, or lotions (deodorant is allowed). Please arrive 15 minutes prior to your appointment time. IN 3 MONTHS  Please note: We ask at that you not bring children with you during ultrasound (echo/ vascular) testing. Due to room size and safety concerns, children are not allowed in the ultrasound rooms during exams. Our front office staff cannot provide observation of children in our lobby area while testing is being conducted. An adult accompanying a patient to their appointment will only be allowed in the ultrasound room at the discretion of the ultrasound technician under special circumstances. We apologize for any inconvenience.  Referrals:  You have been referred to Cardiac Rehab, they will call you to schedule  Special Instructions // Education:  Do the following things EVERYDAY: Weigh yourself in the morning before breakfast. Write it down and keep it in a log. Take your medicines as prescribed Eat  low salt foods--Limit salt (sodium) to 2000 mg per day.  Stay as active as you can everyday Limit all fluids for the day to less than 2 liters   Follow-Up in:   Please follow up with our heart failure pharmacist in 1 month  Your physician recommends that you schedule a follow-up appointment in: 3 months with an echocardiogram    At the [facility], you and your health needs are our priority. We have a designated team specialized in the treatment of Heart Failure. This Care Team includes your primary Heart Failure Specialized Cardiologist (physician), Advanced Practice Providers (APPs- Physician Assistants and Nurse Practitioners), and Pharmacist who all work together to provide you with the care you need, when you need it.   You may see any of the following providers on your designated Care Team at your next follow up:  Dr. Toribio Fuel Dr. Ezra Shuck Dr. Odis Brownie Greig Mosses, NP Caffie Shed, [place] [facility] [place], [place] Beckey Coe, NP Jordan Lee, NP Tinnie Redman, PharmD   Please be sure to bring in all your medications bottles to every appointment.   Need to Contact Us :  If you have any questions or concerns before your next appointment please send us  a message through [place] or call our office at [phone].    TO LEAVE A MESSAGE FOR THE NURSE SELECT OPTION 2, PLEASE LEAVE A MESSAGE INCLUDING: YOUR NAME DATE OF BIRTH CALL BACK NUMBER REASON FOR CALL**this is important as we prioritize the call backs  YOU WILL RECEIVE A CALL BACK  THE SAME DAY AS LONG AS YOU CALL BEFORE 4:00 PM

## 2024-04-30 ENCOUNTER — Other Ambulatory Visit: Payer: Self-pay | Admitting: Neurology

## 2024-04-30 ENCOUNTER — Telehealth: Payer: Self-pay

## 2024-04-30 ENCOUNTER — Other Ambulatory Visit (HOSPITAL_COMMUNITY): Payer: Self-pay

## 2024-04-30 DIAGNOSIS — G43019 Migraine without aura, intractable, without status migrainosus: Secondary | ICD-10-CM

## 2024-04-30 DIAGNOSIS — R634 Abnormal weight loss: Secondary | ICD-10-CM

## 2024-04-30 DIAGNOSIS — Z681 Body mass index (BMI) 19 or less, adult: Secondary | ICD-10-CM

## 2024-04-30 NOTE — Telephone Encounter (Signed)
 Pharmacy Patient Advocate Encounter  Received notification from [employer] that Prior Authorization for Nurtec has been APPROVED from 04/16/2024 to until further notice. Ran test claim, Copay is $623.72. This test claim was processed through [facility]- copay amounts may vary at other pharmacies due to pharmacy/plan contracts, or as the patient moves through the different stages of their insurance plan.   PA #/Case ID/Reference #: [id number]  THIS PA WAS NOT SUBMITTED BY THE PRIOR AUTHORIZATION TEAM

## 2024-05-01 ENCOUNTER — Telehealth (HOSPITAL_COMMUNITY): Payer: Self-pay

## 2024-05-01 NOTE — Telephone Encounter (Signed)
 If patient calls back make her aware nurtec has been approved and avaliable for pick up at pharmacy

## 2024-05-01 NOTE — Telephone Encounter (Signed)
 Faxed over documentation request for EKG 12 lead to [facility] Medical records at [phone].

## 2024-05-07 ENCOUNTER — Telehealth (HOSPITAL_COMMUNITY): Payer: Self-pay

## 2024-05-07 NOTE — Telephone Encounter (Signed)
 Patient called back to get scheduled in the Cardiac Rehab Program. Patient will come in for orientation on 12/30 and will attend the 10:15 exercise class.  Sent MyChart message.

## 2024-05-07 NOTE — Telephone Encounter (Signed)
 Attempted to schedule cardiac rehab- no answer, left message. Sent MyChart message.

## 2024-05-08 ENCOUNTER — Telehealth (HOSPITAL_COMMUNITY): Payer: Self-pay | Admitting: Licensed Clinical Social Worker

## 2024-05-08 NOTE — Telephone Encounter (Signed)
 Pt called CSW to request help getting STD paperwork to appropriate person.  Paperwork received and placed in Dr. Maisie box for completion  Jastin Fore H. Aadil Sur, LCSW Clinical Social Worker Advanced Heart Failure Clinic Desk#: [phone] Cell#: [phone]

## 2024-05-11 ENCOUNTER — Other Ambulatory Visit: Payer: Self-pay

## 2024-05-11 ENCOUNTER — Emergency Department (HOSPITAL_BASED_OUTPATIENT_CLINIC_OR_DEPARTMENT_OTHER)

## 2024-05-11 ENCOUNTER — Encounter (HOSPITAL_BASED_OUTPATIENT_CLINIC_OR_DEPARTMENT_OTHER): Payer: Self-pay

## 2024-05-11 ENCOUNTER — Emergency Department (HOSPITAL_BASED_OUTPATIENT_CLINIC_OR_DEPARTMENT_OTHER)
Admission: EM | Admit: 2024-05-11 | Discharge: 2024-05-11 | Disposition: A | Discharge status: Home / Self Care | Attending: Emergency Medicine | Admitting: Emergency Medicine

## 2024-05-11 DIAGNOSIS — Z79899 Other long term (current) drug therapy: Secondary | ICD-10-CM | POA: Insufficient documentation

## 2024-05-11 DIAGNOSIS — E039 Hypothyroidism, unspecified: Secondary | ICD-10-CM | POA: Insufficient documentation

## 2024-05-11 DIAGNOSIS — E876 Hypokalemia: Secondary | ICD-10-CM | POA: Insufficient documentation

## 2024-05-11 DIAGNOSIS — I2510 Atherosclerotic heart disease of native coronary artery without angina pectoris: Secondary | ICD-10-CM | POA: Insufficient documentation

## 2024-05-11 DIAGNOSIS — R0602 Shortness of breath: Secondary | ICD-10-CM | POA: Insufficient documentation

## 2024-05-11 HISTORY — DX: Acute myocardial infarction, unspecified: I21.9

## 2024-05-11 LAB — TROPONIN T, HIGH SENSITIVITY
Troponin T High Sensitivity: 15 ng/L (ref 0–19)
Troponin T High Sensitivity: <15 ng/L (ref 0–19)
Troponin T High Sensitivity: <15 ng/L (ref 0–19)

## 2024-05-11 LAB — CBC
HCT: 38.4 % (ref 36.0–46.0)
Hemoglobin: 12.9 g/dL (ref 12.0–15.0)
MCH: 33.1 pg (ref 26.0–34.0)
MCHC: 33.6 g/dL (ref 30.0–36.0)
MCV: 98.5 fL (ref 80.0–100.0)
Platelets: 199 K/uL (ref 150–400)
RBC: 3.9 MIL/uL (ref 3.87–5.11)
RDW: 13.2 % (ref 11.5–15.5)
WBC: 7.5 K/uL (ref 4.0–10.5)
nRBC: 0 % (ref 0.0–0.2)

## 2024-05-11 LAB — BASIC METABOLIC PANEL WITH GFR
Anion gap: 16 — ABNORMAL HIGH (ref 5–15)
BUN: 22 mg/dL (ref 8–23)
CO2: 20 mmol/L — ABNORMAL LOW (ref 22–32)
Calcium: 10.4 mg/dL — ABNORMAL HIGH (ref 8.9–10.3)
Chloride: 103 mmol/L (ref 98–111)
Creatinine, Ser: 1.07 mg/dL — ABNORMAL HIGH (ref 0.44–1.00)
GFR, Estimated: 57 mL/min — ABNORMAL LOW (ref >60)
Glucose, Bld: 88 mg/dL (ref 70–99)
Potassium: 3.2 mmol/L — ABNORMAL LOW (ref 3.5–5.1)
Sodium: 140 mmol/L (ref 135–145)

## 2024-05-11 MED ORDER — POTASSIUM CHLORIDE CRYS ER 20 MEQ PO TBCR
40.0000 meq | EXTENDED_RELEASE_TABLET | Freq: Once | ORAL | Status: AC
  Administered 2024-05-11: 40 meq via ORAL
  Filled 2024-05-11: qty 2

## 2024-05-11 NOTE — ED Triage Notes (Signed)
 Pt states that she had a heart attack x 4 weeks ago and has a defib vest. Pt woke up this morning grunting and SOB. She also feels like she is in slow motion. Denies chest pain.

## 2024-05-11 NOTE — ED Provider Notes (Signed)
 [place] [facility] AT [facility] Provider Note   CSN: [id number] Arrival date & time: 05/11/24  9770     Patient presents with: Shortness of Breath   Angelica Stone is a 68 y.o. female.   The history is provided by the patient.   Patient with history of CAD presents with shortness of breath. Patient with recent MI, had stent placed now with LifeVest in place reports she woke up feeling out of breath and grunting.  No chest or back pain but overall this felt similar to prior episode of MI No fevers or vomiting.  She is now feeling improved. No new lower extremity edema. No syncope.  No shocks from her LifeVest   Past Medical History:  Diagnosis Date   Allergy    Anxiety    Arthritis    Collagenous colitis    Depression    Gallstone    History of kidney stones    Hypothyroidism    Migraines    MVP (mitral valve prolapse)    early 20's   Myocardial infarct (HCC)    Pneumonia    Swelling of both ankles    Varicose veins of left leg with edema     Prior to Admission medications   Medication Sig Start Date End Date Taking? Authorizing Provider  budesonide  (ENTOCORT EC ) 3 MG 24 hr capsule Take 9 mg by mouth. 04/18/24   [provider]  Cholecalciferol (VITAMIN D3) 125 MCG (5000 UT) CAPS Take 5,000 Units by mouth daily.     [provider]  citalopram  (CELEXA ) 40 MG tablet Take 40 mg by mouth daily. Patient not taking: Reported on 04/26/2024    [provider]  cyanocobalamin  2000 MCG tablet Take 2,000 mcg by mouth daily.    [provider]  levothyroxine  (SYNTHROID ) 100 MCG tablet Take 100 mcg by mouth daily.    [provider]  metoprolol succinate (TOPROL-XL) 25 MG 24 hr tablet Take 25 mg by mouth daily. 04/18/24   [provider]  Multiple Vitamin (MULTIVITAMIN) tablet Take 1 tablet by mouth daily.    [provider]  nitrofurantoin , macrocrystal-monohydrate, (MACROBID ) 100 MG capsule  Take 1 capsule (100 mg total) by mouth 2 (two) times daily. Patient not taking: Reported on 04/26/2024 10/30/23   Desiderio Chew, PA-C  nitroGLYCERIN  (NITROSTAT ) 0.4 MG SL tablet Place 1 tablet (0.4 mg total) under the tongue every 5 (five) minutes x 3 doses as needed for chest pain (if no relief after 3rd dose, proceed to ED or call 911). 05/18/22   Mallipeddi, Vishnu P, MD  NURTEC 75 MG TBDP TAKE 1 TABLET (75 MG TOTAL) BY MOUTH EVERY OTHER DAY 05/01/24   Buck Saucer, MD  ondansetron  (ZOFRAN -ODT) 4 MG disintegrating tablet Take 1 tablet (4 mg total) by mouth every 8 (eight) hours as needed for nausea or vomiting. 10/30/23   Desiderio Chew, PA-C  pantoprazole  (PROTONIX ) 40 MG tablet Take 1 tablet (40 mg total) by mouth daily. 08/29/23   Nandigam, Kavitha V, MD  prasugrel  (EFFIENT ) 10 MG TABS tablet Take 60 mg (6 tabs) first day then 10 mg (1 tab) Daily 04/26/24   Stoner, Benjamin J, MD  rosuvastatin (CRESTOR) 20 MG tablet 1 tablet Orally Once a day for 90 days for cholesterol    [provider]  topiramate  (TOPAMAX ) 100 MG tablet Take 100 mg by mouth 2 (two) times daily.    [provider]    Allergies: Sulfa antibiotics    Review  of Systems  Constitutional:  Negative for fever.  Respiratory:  Positive for shortness of breath.   Cardiovascular:  Negative for chest pain.  Gastrointestinal:  Negative for vomiting.    Updated Vital Signs BP 107/70   Pulse (!) 57   Temp 97.6 F (36.4 C) (Oral)   Resp 11   SpO2 100%   Physical Exam CONSTITUTIONAL: Elderly, no acute distress HEAD: Normocephalic/atraumatic ENMT: Mucous membranes moist SPINE/BACK:entire spine nontender CV: S1/S2 noted, no murmurs/rubs/gallops noted LUNGS: Lungs are clear to auscultation bilaterally, no apparent distress LifeVest in place ABDOMEN: soft, nontender NEURO: Pt is awake/alert/appropriate, moves all extremitiesx4.  No facial droop.   EXTREMITIES: pulses normal/equal, full ROM No lower  extremity edema SKIN: warm, color normal PSYCH: no abnormalities of mood noted, alert and oriented to situation  (all labs ordered are listed, but only abnormal results are displayed) Labs Reviewed  BASIC METABOLIC PANEL WITH GFR - Abnormal; Notable for the following components:      Result Value   Potassium 3.2 (*)    CO2 20 (*)    Creatinine, Ser 1.07 (*)    Calcium 10.4 (*)    GFR, Estimated 57 (*)    Anion gap 16 (*)    All other components within normal limits  CBC  TROPONIN T, HIGH SENSITIVITY  TROPONIN T, HIGH SENSITIVITY    EKG: EKG Interpretation Date/Time:  Saturday May 11 2024 03:52:02 EST Ventricular Rate:  62 PR Interval:  197 QRS Duration:  93 QT Interval:  442 QTC Calculation: 449 R Axis:   94  Text Interpretation: Sinus rhythm Right atrial enlargement Right axis deviation Abnrm T, consider ischemia, anterolateral lds Confirmed by Midge Golas ([id number]) on 05/11/2024 3:56:50 AM  Radiology: [facility] Chest Port 1 View Result Date: 05/11/2024 EXAM: 1 VIEW(S) XRAY OF THE CHEST 05/11/2024 02:58:00 AM COMPARISON: 04/13/2022. CLINICAL HISTORY: Chest pain. FINDINGS: LUNGS AND PLEURA: The lungs are symmetrically hyperinflated in keeping with changes of underlying COPD. No focal pulmonary opacity. No pleural effusion. No pneumothorax. HEART AND MEDIASTINUM: No acute abnormality of the cardiac and mediastinal silhouettes. BONES AND SOFT TISSUES: No acute osseous abnormality. IMPRESSION: 1. No acute cardiopulmonary abnormality. 2. Symmetrically hyperinflated lungs consistent with COPD. Electronically signed by: Dorethia Molt MD 05/11/2024 03:06 AM EST RP Workstation: [id number]     Procedures   Medications Ordered in the ED  potassium chloride  SA (KLOR-CON  M) CR tablet 40 mEq (40 mEq Oral Given 05/11/24 0506)    Clinical Course as of 05/11/24 0658  Sat May 11, 2024  0455 Discussed with Dr. Debarah with cardiology.  He recommends transfer to [facility] for  evaluation  Patient reporting symptoms similar to the previous time she had an MI.  Also has EKG changes.  Patient currently chest pain-free [DW]  0546 Overall patient is feeling well, no acute distress.  No chest or back pain at this time.  She is not appear dyspneic  However patient does appear to have T wave changes in leads V3. I do long conversation with patient and her siblings.  Her sister is a critical care physician and she was able to pull up an EKG from her recent hospitalization and the T wave change in V3 does appear to be old   [DW]  [phone] After further discussion with patient and her family, we will continue to monitor here and pull a repeat troponin at around 7 AM.  Will also do repeat EKG.  If there are no dynamic changes patient would like to  be discharged and follow-up with a cardiologist [DW]  [phone] Patient seen walking around the ER in no distress [DW]  0658 Repeat troponin negative.  EKG is unchanged.  Patient has had no new symptoms. She would like to be discharged. She will follow-up with cardiology [DW]    Clinical Course User Index [DW] Midge Golas, MD                                 Medical Decision Making Amount and/or Complexity of Data Reviewed Labs: ordered. Radiology: ordered. ECG/medicine tests: ordered.  Risk Prescription drug management.   This patient presents to the ED for concern of shortness of breath, this involves an extensive number of treatment options, and is a complaint that carries with it a high risk of complications and morbidity.  The differential diagnosis includes but is not limited to Acute coronary syndrome, pneumonia, acute pulmonary edema, pneumothorax, acute anemia, pulmonary embolism    Comorbidities that complicate the patient evaluation: Patient's presentation is complicated by their history of CAD  Social Determinants of Health: Patient's recent hospitalization  increases the complexity of managing their  presentation  Additional history obtained: Additional history obtained from family Records reviewed Care Everywhere/External Records and cardiology notes  Lab Tests: I Ordered, and personally interpreted labs.  The pertinent results include: Mild hypokalemia  Imaging Studies ordered: I ordered imaging studies including X-ray chest  I independently visualized and interpreted imaging which showed no acute findings I agree with the radiologist interpretation  Cardiac Monitoring: The patient was maintained on a cardiac monitor.  I personally viewed and interpreted the cardiac monitor which showed an underlying rhythm of:  sinus rhythm  Medicines ordered and prescription drug management: I ordered medication including potassium for hypokalemia  Consultations Obtained: I requested consultation with the consultant cardiology, and discussed  findings as well as pertinent plan - they recommend: Consider transfer to [facility] or monitor in the ER  Reevaluation: After the interventions noted above, I reevaluated the patient and found that they have :improved  Complexity of problems addressed: Patient's presentation is most consistent with  acute presentation with potential threat to life or bodily function      Final diagnoses:  Shortness of breath    ED Discharge Orders     None          Midge Golas, MD 05/11/24 [phone]

## 2024-05-15 ENCOUNTER — Telehealth (HOSPITAL_COMMUNITY): Payer: Self-pay

## 2024-05-15 NOTE — Telephone Encounter (Signed)
 Forms faxed to [facility] and have also e-mailed a copy to the patient at her request.

## 2024-05-22 NOTE — Addendum Note (Signed)
 Encounter addended by: Debarah Garrison MATSU, RN on: 05/22/2024 3:51 PM  Actions taken: Imaging Exam ended

## 2024-05-23 ENCOUNTER — Telehealth (HOSPITAL_COMMUNITY): Payer: Self-pay | Admitting: Cardiology

## 2024-05-23 NOTE — Telephone Encounter (Signed)
 Pt aware and voiced understanding  Reports she will continue NOT wearing vest

## 2024-05-23 NOTE — Telephone Encounter (Signed)
 Patient called to report she is having an allergic reaction to life vest. Reports rash is present at all over, describes rash as burning rash  Reports rash started where straps are located-spread down arm and across chest  Was advised to use ointment by PCP MD, skin is starting to peel  Reports she has not used life vest x 1.5 weeks  Please advise

## 2024-05-24 NOTE — Progress Notes (Incomplete)
 " Advanced Heart Failure Clinic Note  PCP: Gerome Brunet, DO PCP-Cardiologist: Diannah SHAUNNA Maywood, MD HF-Cardiologist: Morene Brownie, MD  HPI:  Angelica Stone is a 68 y.o. female with a PMH of hypothyroidism, colitis, anxiety, migraines who presents for pharmacist medication titration.   Presented 04/2024 to [facility] with chest pain that woke her from sleep, found to have LAD occlusion, treated with PCI. Loaded on aspirin  and ticagrelor, initial echo with concern for LV thrombus, gone on repeat. Orthostatic during admission, given fluids during admit. Course complicated by watery diarrhea, GI consulted. LVEF 30-35% at discharge.   Seen by Dr. Brownie 04/26/24 where ticagrelor was transitioned to prasugrel  due to shortness of breath despite euvolemia. Was otherwise feeling well.  Presented to the ED 05/11/24 for shortness of breath.  Today Angelica Stone returns to Heart Failure Clinic for pharmacist medication titration. Reports feeling good. Reports feeling stronger over the past few weeks. Reports fatigue and orthopnea have been persistent. Shortness of breath has improved since transitioning to prasugrel , now only occurring with moderate exertion. Denies dizziness, lightheadedness, chest pain, palpitations, LEE, and PND. Reports being able to complete all activities of daily living (ADLs). Is becoming more active throughout the day. Weight at home is ~98-102 pounds. Takes no diuretic. Appetite is fair, though limited by her teeth. She is being fitted for dentures.   Current Heart Failure Medications: Loop diuretic: none Beta-Blocker: metoprolol succinate 25 mg daily ACEI/ARB/ARNI: none MRA: none SGLT2i: none Other: none  Has the patient been experiencing any side effects to the medications prescribed? No  Does the patient have any problems obtaining medications due to transportation or finances? No  Understanding of regimen: Excellent  Understanding of indications:  Excellent  Potential of adherence: Excellent  Patient understands to avoid NSAIDs.  Pertinent Lab Values: Creatinine, Ser  Date Value Ref Range Status  05/11/2024 1.07 (H) 0.44 - 1.00 mg/dL Final   BUN  Date Value Ref Range Status  05/11/2024 22 8 - 23 mg/dL Final   Potassium  Date Value Ref Range Status  05/11/2024 3.2 (L) 3.5 - 5.1 mmol/L Final   Sodium  Date Value Ref Range Status  05/11/2024 140 135 - 145 mmol/L Final   B Natriuretic Peptide  Date Value Ref Range Status  04/13/2022 16.0 0.0 - 100.0 pg/mL Final    Comment:    Performed at [facility], [address] [place], [place] [address]   Magnesium  Date Value Ref Range Status  10/30/2023 2.5 (H) 1.7 - 2.4 mg/dL Final    Comment:    Performed at [facility], [address], [place], [place] [address]   TSH  Date Value Ref Range Status  04/13/2022 0.024 (L) 0.350 - 4.500 uIU/mL Final    Comment:    Performed by a 3rd Generation assay with a functional sensitivity of <=0.01 uIU/mL. Performed at [facility], [address] [place], [place] [address]    DATA REVIEW   ECG: 10/2023: Normal sinus rhythm, normal QRS   ECHO: 06/07/2022: LVEF 55 to 60%, relatively normal echocardiogram 04/2024: From Care Everywhere, LVEF 30 to 35%, anterior wall motion abnormalities   CATH: Prior normal SPECT in 2023.  Recent cath with LAD occlusion with PCI  Vital Signs: Today's Vitals   05/27/24 1527  BP: 102/68  Pulse: 78  SpO2: 100%  Weight: 100 lb 6.4 oz (45.5 kg)    Assessment/Plan: Chronic systolic heart failure: Recent LAD infarction, anterior wall motion abnormalities noted on outside echocardiogram.  Appears euvolemic, NYHA class II-III symptoms, improved from previous. Weight is down 4 lbs from last visit.  - Stopped LifeVest due to allergic reaction, MD aware and discussed consequences of not wearing. - Cardiac rehab referral sent - Continue metoprolol succinate 25 mg daily - Blood  pressure is improved today, however, patient's weight is down and suspect she has low volume due to poor PO intake. BNP normal. Would like to start low dose spironolactone, however, hesistant today given volume status. Patient encouraged to increase PO intake. Will consider spironolactone next visit. - No SGLT2 currently due to history of UTI, however patient reports none in the past couple years. Would consider adding when she can maintain volume.   Coronary artery disease: - Recent LAD STEMI - Decrease prasugrel  to 5 mg daily due to body weight <60 kg. Discussed with MD. - Continue Crestor 20 mg daily   Reported NSVT/palpitations: Also reportedly with long QT during admission, on multiple agents. - Still wearing LifeVest, no events - Given ongoing issues with palpitations we will obtain 14-day Zio patch - MD will consider starting mexiletine if worsening symptoms, holding off given significant GI issues and reassuring exam  - BMET and Mg today WNL. Initially intended to start potassium supplement with several <4, but will hodl off with normal K today.  Follow up: MD in February  Please do not hesitate to reach out with questions or concerns,  Jaun Bash, PharmD, CPP, BCPS, [facility] Heart Failure Pharmacist  Phone - [phone] 05/27/2024 4:19 PM  "

## 2024-05-27 ENCOUNTER — Telehealth (HOSPITAL_COMMUNITY): Payer: Self-pay

## 2024-05-27 ENCOUNTER — Ambulatory Visit (HOSPITAL_COMMUNITY)
Admission: RE | Admit: 2024-05-27 | Discharge: 2024-05-27 | Disposition: A | Discharge status: Home / Self Care | Source: Ambulatory Visit | Attending: Internal Medicine | Admitting: Internal Medicine

## 2024-05-27 VITALS — BP 102/68 | HR 78 | Wt 100.4 lb

## 2024-05-27 DIAGNOSIS — I5022 Chronic systolic (congestive) heart failure: Secondary | ICD-10-CM | POA: Diagnosis present

## 2024-05-27 DIAGNOSIS — R0601 Orthopnea: Secondary | ICD-10-CM | POA: Insufficient documentation

## 2024-05-27 DIAGNOSIS — R0602 Shortness of breath: Secondary | ICD-10-CM | POA: Insufficient documentation

## 2024-05-27 DIAGNOSIS — I2510 Atherosclerotic heart disease of native coronary artery without angina pectoris: Secondary | ICD-10-CM | POA: Insufficient documentation

## 2024-05-27 DIAGNOSIS — I472 Ventricular tachycardia, unspecified: Secondary | ICD-10-CM | POA: Diagnosis not present

## 2024-05-27 DIAGNOSIS — R002 Palpitations: Secondary | ICD-10-CM | POA: Insufficient documentation

## 2024-05-27 DIAGNOSIS — R5383 Other fatigue: Secondary | ICD-10-CM | POA: Insufficient documentation

## 2024-05-27 DIAGNOSIS — I252 Old myocardial infarction: Secondary | ICD-10-CM | POA: Insufficient documentation

## 2024-05-27 LAB — BASIC METABOLIC PANEL WITH GFR
Anion gap: 11 (ref 5–15)
BUN: 21 mg/dL (ref 8–23)
CO2: 22 mmol/L (ref 22–32)
Calcium: 9.8 mg/dL (ref 8.9–10.3)
Chloride: 108 mmol/L (ref 98–111)
Creatinine, Ser: 0.73 mg/dL (ref 0.44–1.00)
GFR, Estimated: >60 mL/min
Glucose, Bld: 85 mg/dL (ref 70–99)
Potassium: 4.3 mmol/L (ref 3.5–5.1)
Sodium: 140 mmol/L (ref 135–145)

## 2024-05-27 LAB — PRO BRAIN NATRIURETIC PEPTIDE: Pro Brain Natriuretic Peptide: 225 pg/mL

## 2024-05-27 LAB — MAGNESIUM: Magnesium: 2.5 mg/dL — ABNORMAL HIGH (ref 1.7–2.4)

## 2024-05-27 MED ORDER — FOLIC ACID 1 MG PO TABS: 1.0000 mg | ORAL_TABLET | Freq: Every day | ORAL | Status: AC

## 2024-05-27 MED ORDER — POTASSIUM CHLORIDE CRYS ER 10 MEQ PO TBCR: 10.0000 meq | EXTENDED_RELEASE_TABLET | Freq: Two times a day (BID) | ORAL | 5 refills | Status: DC

## 2024-05-27 NOTE — Addendum Note (Signed)
 Encounter addended by: Delsie Jaun RAMAN, RPH-CPP on: 05/27/2024 5:27 PM  Actions taken: Clinical Note Signed, Order list changed, Medication long-term status modified

## 2024-05-27 NOTE — Telephone Encounter (Signed)
 Pt insurance is active and benefits verified through [employer]. Co-pay $0, DED $257/$257 met, out of pocket $0/$0 met, co-insurance 20%. No pre-authorization required. Passport, 05/27/2024 @ 1:22pm, REF# [id number].  2ndary insurance is active and benefits verified through [place]. Co-pay $0, DED $0/$0 met, out of pocket $0/$0 met, co-insurance 0%. No pre-authorization required.   TCR/ICR? ICR Visit(date of service)limitation? No Can multiple codes be used on the same date of service/visit?(IF ITS A LIMIT) N/A  Is this a lifetime maximum or an annual maximum? Annual Has the member used any of these services to date? No Is there a time limit (weeks/months) on start of program and/or program completion? No

## 2024-05-27 NOTE — Patient Instructions (Addendum)
 It was a pleasure seeing you today!  MEDICATIONS: -We are changing your medications today -START taking 20 meq of potassium daily (2 tablets daily) - We will call you if labs show that it is safe to add any medications to help your heart pumping function. - You can ask your primary care doctor about starting sertraline 25 mg daily for anxiety and depression. This has significantly less impact on Qtc compared to Celexa . Also, please avoid Zofran  and Benadryl .  -Call if you have questions about your medications.  LABS: -We will call you if your labs need attention.    Please make sure that you are drinking 2L of fluid per day  Call the clinic at [phone] with questions or to reschedule future appointments.

## 2024-05-28 ENCOUNTER — Ambulatory Visit (HOSPITAL_COMMUNITY): Payer: Self-pay | Admitting: Cardiology

## 2024-06-04 ENCOUNTER — Encounter (HOSPITAL_COMMUNITY)
Admission: RE | Admit: 2024-06-04 | Discharge: 2024-06-04 | Disposition: A | Discharge status: Home / Self Care | Source: Ambulatory Visit | Attending: Cardiology | Admitting: Cardiology

## 2024-06-04 VITALS — BP 96/68 | HR 64 | Ht 70.0 in | Wt 103.2 lb

## 2024-06-04 DIAGNOSIS — I214 Non-ST elevation (NSTEMI) myocardial infarction: Secondary | ICD-10-CM | POA: Diagnosis present

## 2024-06-04 DIAGNOSIS — Z955 Presence of coronary angioplasty implant and graft: Secondary | ICD-10-CM | POA: Diagnosis present

## 2024-06-04 NOTE — Progress Notes (Signed)
 Cardiac Individual Treatment Plan  Patient Details  Name: Angelica Stone MRN: [id number] Date of Birth: [birth year] Referring Provider:   Flowsheet Row INTENSIVE CARDIAC REHAB ORIENT from 06/04/2024 in [facility]  Referring Provider Zenaida Morene PARAS, MD    Initial Encounter Date:  Flowsheet Row INTENSIVE CARDIAC REHAB ORIENT from 06/04/2024 in [facility]  Date 06/04/24    Visit Diagnosis: 04/12/24 NSTEMI  04/12/24 DES LAD  Patient's Home Medications on Admission: Current Medications[1]  Past Medical History: Past Medical History:  Diagnosis Date   Allergy    Anxiety    Arthritis    Collagenous colitis    Depression    Gallstone    History of kidney stones    Hypothyroidism    Migraines    MVP (mitral valve prolapse)    early 20's   Myocardial infarct (HCC)    Pneumonia    Swelling of both ankles    Varicose veins of left leg with edema     Tobacco Use: Tobacco Use History[2]  Labs: Review Flowsheet        No data to display          Capillary Blood Glucose: No results found for: GLUCAP   Exercise Target Goals: Exercise Program Goal: Individual exercise prescription set using results from initial 6 min walk test and THRR while considering  patients activity barriers and safety.   Exercise Prescription Goal: Initial exercise prescription builds to 30-45 minutes a day of aerobic activity, 2-3 days per week.  Home exercise guidelines will be given to patient during program as part of exercise prescription that the participant will acknowledge.  Activity Barriers & Risk Stratification:  Activity Barriers & Cardiac Risk Stratification - 06/04/24 1454       Activity Barriers & Cardiac Risk Stratification   Activity Barriers Left Hip Replacement;Neck/Spine Problems;Shortness of Breath;Balance Concerns;History of Falls;Assistive Device     Cardiac Risk Stratification High          6 Minute Walk:  6 Minute Walk     Row Name 06/04/24 1524         6 Minute Walk   Phase Initial     Distance 1320 feet     Walk Time 6 minutes     # of Rest Breaks 0     MPH 2.5     METS 3.7     RPE 12     Perceived Dyspnea  1.5     VO2 Peak 12.94     Symptoms Yes (comment)     Comments Mild shortness of breath.     Resting HR 64 bpm     Resting BP 96/68     Resting Oxygen Saturation  100 %     Exercise Oxygen Saturation  during 6 min walk 100 %     Max Ex. HR 91 bpm     Max Ex. BP 108/76     2 Minute Post BP 110/78        Oxygen Initial Assessment:   Oxygen Re-Evaluation:   Oxygen Discharge (Final Oxygen Re-Evaluation):   Initial Exercise Prescription:  Initial Exercise Prescription - 06/04/24 1500       Date of Initial Exercise RX and Referring Provider   Date 06/04/24    Referring Provider Zenaida Morene PARAS, MD    Expected Discharge Date 08/28/24      Recumbant Bike  Level 1    RPM 20    Watts 15    Minutes 15    METs 2.3      NuStep   Level 1    SPM 75    Minutes 15    METs 2.1      Prescription Details   Frequency (times per week) 3    Duration Progress to 30 minutes of continuous aerobic without signs/symptoms of physical distress      Intensity   THRR 40-80% of Max Heartrate 62-123    Ratings of Perceived Exertion 11-13    Perceived Dyspnea 0-4      Progression   Progression Continue to progress workloads to maintain intensity without signs/symptoms of physical distress.      Resistance Training   Training Prescription Yes    Weight 2 lbs    Reps 10-15          Perform Capillary Blood Glucose checks as needed.  Exercise Prescription Changes:   Exercise Comments:   Exercise Goals and Review:   Exercise Goals     Row Name 06/04/24 1450             Exercise Goals   Increase Physical Activity Yes       Intervention Provide advice, education, support and  counseling about physical activity/exercise needs.;Develop an individualized exercise prescription for aerobic and resistive training based on initial evaluation findings, risk stratification, comorbidities and participant's personal goals.       Expected Outcomes Short Term: Attend rehab on a regular basis to increase amount of physical activity.;Long Term: Add in home exercise to make exercise part of routine and to increase amount of physical activity.;Long Term: Exercising regularly at least 3-5 days a week.       Increase Strength and Stamina Yes       Intervention Provide advice, education, support and counseling about physical activity/exercise needs.;Develop an individualized exercise prescription for aerobic and resistive training based on initial evaluation findings, risk stratification, comorbidities and participant's personal goals.       Expected Outcomes Short Term: Increase workloads from initial exercise prescription for resistance, speed, and METs.;Short Term: Perform resistance training exercises routinely during rehab and add in resistance training at home;Long Term: Improve cardiorespiratory fitness, muscular endurance and strength as measured by increased METs and functional capacity ( )       Able to understand and use rate of perceived exertion (RPE) scale Yes       Intervention Provide education and explanation on how to use RPE scale       Expected Outcomes Short Term: Able to use RPE daily in rehab to express subjective intensity level;Long Term:  Able to use RPE to guide intensity level when exercising independently       Knowledge and understanding of Target Heart Rate Range (THRR) Yes       Intervention Provide education and explanation of THRR including how the numbers were predicted and where they are located for reference       Expected Outcomes Short Term: Able to state/look up THRR;Long Term: Able to use THRR to govern intensity when exercising independently;Short Term:  Able to use daily as guideline for intensity in rehab       Able to check pulse independently Yes       Intervention Provide education and demonstration on how to check pulse in carotid and radial arteries.;Review the importance of being able to check your own pulse for safety during independent exercise  Expected Outcomes Short Term: Able to explain why pulse checking is important during independent exercise;Long Term: Able to check pulse independently and accurately       Understanding of Exercise Prescription Yes       Intervention Provide education, explanation, and written materials on patient's individual exercise prescription       Expected Outcomes Short Term: Able to explain program exercise prescription;Long Term: Able to explain home exercise prescription to exercise independently          Exercise Goals Re-Evaluation :   Discharge Exercise Prescription (Final Exercise Prescription Changes):   Nutrition:  Target Goals: Understanding of nutrition guidelines, daily intake of sodium 1500mg , cholesterol 200mg , calories 30% from fat and 7% or less from saturated fats, daily to have 5 or more servings of fruits and vegetables.  Biometrics:  Pre Biometrics - 06/04/24 1307       Pre Biometrics   Waist Circumference 25.5 inches    Hip Circumference 32.25 inches    Waist to Hip Ratio 0.79 %    Triceps Skinfold 11 mm    % Body Fat 24.1 %    Grip Strength 14 kg    Flexibility --   Not performed. Bilateral left hip replacement.   Single Leg Stand 30 seconds           Nutrition Therapy Plan and Nutrition Goals:   Nutrition Assessments:  MEDIFICTS Score Key: >=70 Need to make dietary changes  40-70 Heart Healthy Diet <= 40 Therapeutic Level Cholesterol Diet    Picture Your Plate Scores: <59 Unhealthy dietary pattern with much room for improvement. 41-50 Dietary pattern unlikely to meet recommendations for good health and room for improvement. 51-60 More  healthful dietary pattern, with some room for improvement.  >60 Healthy dietary pattern, although there may be some specific behaviors that could be improved.    Nutrition Goals Re-Evaluation:   Nutrition Goals Re-Evaluation:   Nutrition Goals Discharge (Final Nutrition Goals Re-Evaluation):   Psychosocial: Target Goals: Acknowledge presence or absence of significant depression and/or stress, maximize coping skills, provide positive support system. Participant is able to verbalize types and ability to use techniques and skills needed for reducing stress and depression.  Initial Review & Psychosocial Screening:  Initial Psych Review & Screening - 06/04/24 1455       Initial Review   Current issues with Current Depression;Current Anxiety/Panic;Current Stress Concerns    Source of Stress Concerns Financial;Family;Unable to perform yard/household activities;Unable to participate in former interests or hobbies    Comments Catrinia has depression/ stress from health concerns and life in general. She was taking Celexa  but is unable to take because of arrhythmia. Waiting to hear if she can start Sertraline.      Family Dynamics   Good Support System? Yes    Comments Alexandra has excellent support from her 3 sisters.      Barriers   Psychosocial barriers to participate in program Psychosocial barriers identified (see note);The patient should benefit from training in stress management and relaxation.      Screening Interventions   Interventions Encouraged to exercise;Provide feedback about the scores to participant;To provide support and resources with identified psychosocial needs    Expected Outcomes Short Term goal: Utilizing psychosocial counselor, staff and physician to assist with identification of specific Stressors or current issues interfering with healing process. Setting desired goal for each stressor or current issue identified.;Long Term Goal: Stressors or current issues are controlled  or eliminated.;Short Term goal: Identification and review with  participant of any Quality of Life or Depression concerns found by scoring the questionnaire.;Long Term goal: The participant improves quality of Life and PHQ9 Scores as seen by post scores and/or verbalization of changes          Quality of Life Scores:  Quality of Life - 06/04/24 1553       Quality of Life   Select Quality of Life      Quality of Life Scores   Health/Function Pre 14.92 %    Socioeconomic Pre 20.25 %    Psych/Spiritual Pre 15.64 %    Family Pre 26 %    GLOBAL Pre 17.34 %         Scores of 19 and below usually indicate a poorer quality of life in these areas.  A difference of  2-3 points is a clinically meaningful difference.  A difference of 2-3 points in the total score of the Quality of Life Index has been associated with significant improvement in overall quality of life, self-image, physical symptoms, and general health in studies assessing change in quality of life.  PHQ-9: Review Flowsheet       06/04/2024  Depression screen PHQ 2/9  Decreased Interest 1  Down, Depressed, Hopeless 2  PHQ - 2 Score 3  Altered sleeping 1  Tired, decreased energy 1  Change in appetite 3  Feeling bad or failure about yourself  2  Trouble concentrating 2  Moving slowly or fidgety/restless 1  Suicidal thoughts 0  PHQ-9 Score 13  Difficult doing work/chores Somewhat difficult   Interpretation of Total Score  Total Score Depression Severity:  1-4 = Minimal depression, 5-9 = Mild depression, 10-14 = Moderate depression, 15-19 = Moderately severe depression, 20-27 = Severe depression   Psychosocial Evaluation and Intervention:   Psychosocial Re-Evaluation:   Psychosocial Discharge (Final Psychosocial Re-Evaluation):   Vocational Rehabilitation: Provide vocational rehab assistance to qualifying candidates.   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 She plans to return to work once cleared to do so. No VR needs at this time.          Education: Education Goals: Education classes will be provided on a weekly basis, covering required topics. Participant will state understanding/return demonstration of topics presented.     Core Videos: Exercise    Move It!  Clinical staff conducted group or individual video education with verbal and written material and guidebook.  Patient learns the recommended Pritikin exercise program. Exercise with the goal of living a long, healthy life. Some of the health benefits of exercise include controlled diabetes, healthier blood pressure levels, improved cholesterol levels, improved heart and lung capacity, improved sleep, and better body composition. Everyone should speak with their doctor before starting or changing an exercise routine.  Biomechanical Limitations Clinical staff conducted group or individual video education with verbal and written material and guidebook.  Patient learns how biomechanical limitations can impact exercise and how we can mitigate and possibly overcome limitations to have an impactful and balanced exercise routine.  Body Composition Clinical staff conducted group or individual video education with verbal and written material and guidebook.  Patient learns that body composition (ratio of muscle mass to fat mass) is a key component to assessing  overall fitness, rather than body weight alone. Increased fat mass, especially visceral belly fat, can put us  at increased risk for metabolic syndrome, type 2 diabetes, heart disease, and even death. It is recommended to combine diet and exercise (cardiovascular and resistance training) to improve your body composition. Seek guidance  from your physician and exercise physiologist before implementing an exercise routine.  Exercise Action Plan Clinical staff conducted group or individual video education with verbal and written material and guidebook.  Patient learns the recommended strategies to achieve and enjoy long-term exercise adherence, including variety, self-motivation, self-efficacy, and positive decision making. Benefits of exercise include fitness, good health, weight management, more energy, better sleep, less stress, and overall well-being.  Medical   Heart Disease Risk Reduction Clinical staff conducted group or individual video education with verbal and written material and guidebook.  Patient learns our heart is our most vital organ as it circulates oxygen, nutrients, white blood cells, and hormones throughout the entire body, and carries waste away. Data supports a plant-based eating plan like the Pritikin Program for its effectiveness in slowing progression of and reversing heart disease. The video provides a number of recommendations to address heart disease.   Metabolic Syndrome and Belly Fat  Clinical staff conducted group or individual video education with verbal and written material and guidebook.  Patient learns what metabolic syndrome is, how it leads to heart disease, and how one can reverse it and keep it from coming back. You have metabolic syndrome if you have 3 of the following 5 criteria: abdominal obesity, high blood pressure, high triglycerides, low HDL cholesterol, and high blood sugar.  Hypertension and Heart Disease Clinical staff conducted group or individual video education with verbal and written material and guidebook.  Patient learns that high blood pressure, or hypertension, is very common in the [place] . Hypertension is largely due to excessive salt intake, but other important risk factors include being overweight, physical inactivity, drinking too much alcohol, smoking, and not  eating enough potassium from fruits and vegetables. High blood pressure is a leading risk factor for heart attack, stroke, congestive heart failure, dementia, kidney failure, and premature death. Long-term effects of excessive salt intake include stiffening of the arteries and thickening of heart muscle and organ damage. Recommendations include ways to reduce hypertension and the risk of heart disease.  Diseases of Our Time - Focusing on Diabetes Clinical staff conducted group or individual video education with verbal and written material and guidebook.  Patient learns why the best way to stop diseases of our time is prevention, through food and other lifestyle changes. Medicine (such as prescription pills and surgeries) is often only a Band-Aid on the problem, not a long-term solution. Most common diseases of our time include obesity, type 2 diabetes, hypertension, heart disease, and cancer. The Pritikin Program is recommended and has been proven to help reduce, reverse, and/or prevent the damaging effects of metabolic syndrome.  Nutrition   Overview of the Pritikin Eating Plan  Clinical staff conducted group or individual video education with verbal and written material and guidebook.  Patient learns about the Pritikin Eating Plan for disease risk reduction. The Pritikin Eating Plan emphasizes a wide variety of unrefined, minimally-processed carbohydrates, like fruits, vegetables, whole grains, and legumes. Go, Caution, and Stop food choices are explained. Plant-based and lean animal proteins are emphasized. Rationale provided for low sodium intake for blood pressure control, low added sugars for blood sugar stabilization, and low added fats and oils for coronary artery disease risk  reduction and weight management.  Calorie Density  Clinical staff conducted group or individual video education with verbal and written material and guidebook.  Patient learns about calorie density and how it impacts the  Pritikin Eating Plan. Knowing the characteristics of the food you choose will help you decide whether those foods will lead to weight gain or weight loss, and whether you want to consume more or less of them. Weight loss is usually a side effect of the Pritikin Eating Plan because of its focus on low calorie-dense foods.  Label Reading  Clinical staff conducted group or individual video education with verbal and written material and guidebook.  Patient learns about the Pritikin recommended label reading guidelines and corresponding recommendations regarding calorie density, added sugars, sodium content, and whole grains.  Dining Out - Part 1  Clinical staff conducted group or individual video education with verbal and written material and guidebook.  Patient learns that restaurant meals can be sabotaging because they can be so high in calories, fat, sodium, and/or sugar. Patient learns recommended strategies on how to positively address this and avoid unhealthy pitfalls.  Facts on Fats  Clinical staff conducted group or individual video education with verbal and written material and guidebook.  Patient learns that lifestyle modifications can be just as effective, if not more so, as many medications for lowering your risk of heart disease. A Pritikin lifestyle can help to reduce your risk of inflammation and atherosclerosis (cholesterol build-up, or plaque, in the artery walls). Lifestyle interventions such as dietary choices and physical activity address the cause of atherosclerosis. A review of the types of fats and their impact on blood cholesterol levels, along with dietary recommendations to reduce fat intake is also included.  Nutrition Action Plan  Clinical staff conducted group or individual video education with verbal and written material and guidebook.  Patient learns how to incorporate Pritikin recommendations into their lifestyle. Recommendations include planning and keeping personal  health goals in mind as an important part of their success.  Healthy Mind-Set    Healthy Minds, Bodies, Hearts  Clinical staff conducted group or individual video education with verbal and written material and guidebook.  Patient learns how to identify when they are stressed. Video will discuss the impact of that stress, as well as the many benefits of stress management. Patient will also be introduced to stress management techniques. The way we think, act, and feel has an impact on our hearts.  How Our Thoughts Can Heal Our Hearts  Clinical staff conducted group or individual video education with verbal and written material and guidebook.  Patient learns that negative thoughts can cause depression and anxiety. This can result in negative lifestyle behavior and serious health problems. Cognitive behavioral therapy is an effective method to help control our thoughts in order to change and improve our emotional outlook.  Additional Videos:  Exercise    Improving Performance  Clinical staff conducted group or individual video education with verbal and written material and guidebook.  Patient learns to use a non-linear approach by alternating intensity levels and lengths of time spent exercising to help burn more calories and lose more body fat. Cardiovascular exercise helps improve heart health, metabolism, hormonal balance, blood sugar control, and recovery from fatigue. Resistance training improves strength, endurance, balance, coordination, reaction time, metabolism, and muscle mass. Flexibility exercise improves circulation, posture, and balance. Seek guidance from your physician and exercise physiologist before implementing an exercise routine and learn your capabilities and proper form for all exercise.  Introduction to Yoga  Clinical staff conducted group or individual video education with verbal and written material and guidebook.  Patient learns about yoga, a discipline of the coming  together of mind, breath, and body. The benefits of yoga include improved flexibility, improved range of motion, better posture and core strength, increased lung function, weight loss, and positive self-image. Yogas heart health benefits include lowered blood pressure, healthier heart rate, decreased cholesterol and triglyceride levels, improved immune function, and reduced stress. Seek guidance from your physician and exercise physiologist before implementing an exercise routine and learn your capabilities and proper form for all exercise.  Medical   Aging: Enhancing Your Quality of Life  Clinical staff conducted group or individual video education with verbal and written material and guidebook.  Patient learns key strategies and recommendations to stay in good physical health and enhance quality of life, such as prevention strategies, having an advocate, securing a Health Care Proxy and Power of Attorney, and keeping a list of medications and system for tracking them. It also discusses how to avoid risk for bone loss.  Biology of Weight Control  Clinical staff conducted group or individual video education with verbal and written material and guidebook.  Patient learns that weight gain occurs because we consume more calories than we burn (eating more, moving less). Even if your body weight is normal, you may have higher ratios of fat compared to muscle mass. Too much body fat puts you at increased risk for cardiovascular disease, heart attack, stroke, type 2 diabetes, and obesity-related cancers. In addition to exercise, following the Pritikin Eating Plan can help reduce your risk.  Decoding Lab Results  Clinical staff conducted group or individual video education with verbal and written material and guidebook.  Patient learns that lab test reflects one measurement whose values change over time and are influenced by many factors, including medication, stress, sleep, exercise, food, hydration,  pre-existing medical conditions, and more. It is recommended to use the knowledge from this video to become more involved with your lab results and evaluate your numbers to speak with your doctor.   Diseases of Our Time - Overview  Clinical staff conducted group or individual video education with verbal and written material and guidebook.  Patient learns that according to the CDC, 50% to 70% of chronic diseases (such as obesity, type 2 diabetes, elevated lipids, hypertension, and heart disease) are avoidable through lifestyle improvements including healthier food choices, listening to satiety cues, and increased physical activity.  Sleep Disorders Clinical staff conducted group or individual video education with verbal and written material and guidebook.  Patient learns how good quality and duration of sleep are important to overall health and well-being. Patient also learns about sleep disorders and how they impact health along with recommendations to address them, including discussing with a physician.  Nutrition  Dining Out - Part 2 Clinical staff conducted group or individual video education with verbal and written material and guidebook.  Patient learns how to plan ahead and communicate in order to maximize their dining experience in a healthy and nutritious manner. Included are recommended food choices based on the type of restaurant the patient is visiting.   Fueling a Banker conducted group or individual video education with verbal and written material and guidebook.  There is a strong connection between our food choices and our health. Diseases like obesity and type 2 diabetes are very prevalent and are in large-part due to lifestyle choices. The Pritikin Eating Plan  provides plenty of food and hunger-curbing satisfaction. It is easy to follow, affordable, and helps reduce health risks.  Menu Workshop  Clinical staff conducted group or individual video education  with verbal and written material and guidebook.  Patient learns that restaurant meals can sabotage health goals because they are often packed with calories, fat, sodium, and sugar. Recommendations include strategies to plan ahead and to communicate with the manager, chef, or server to help order a healthier meal.  Planning Your Eating Strategy  Clinical staff conducted group or individual video education with verbal and written material and guidebook.  Patient learns about the Pritikin Eating Plan and its benefit of reducing the risk of disease. The Pritikin Eating Plan does not focus on calories. Instead, it emphasizes high-quality, nutrient-rich foods. By knowing the characteristics of the foods, we choose, we can determine their calorie density and make informed decisions.  Targeting Your Nutrition Priorities  Clinical staff conducted group or individual video education with verbal and written material and guidebook.  Patient learns that lifestyle habits have a tremendous impact on disease risk and progression. This video provides eating and physical activity recommendations based on your personal health goals, such as reducing LDL cholesterol, losing weight, preventing or controlling type 2 diabetes, and reducing high blood pressure.  Vitamins and Minerals  Clinical staff conducted group or individual video education with verbal and written material and guidebook.  Patient learns different ways to obtain key vitamins and minerals, including through a recommended healthy diet. It is important to discuss all supplements you take with your doctor.   Healthy Mind-Set    Smoking Cessation  Clinical staff conducted group or individual video education with verbal and written material and guidebook.  Patient learns that cigarette smoking and tobacco addiction pose a serious health risk which affects millions of people. Stopping smoking will significantly reduce the risk of heart disease, lung disease,  and many forms of cancer. Recommended strategies for quitting are covered, including working with your doctor to develop a successful plan.  Culinary   Becoming a Set Designer conducted group or individual video education with verbal and written material and guidebook.  Patient learns that cooking at home can be healthy, cost-effective, quick, and puts them in control. Keys to cooking healthy recipes will include looking at your recipe, assessing your equipment needs, planning ahead, making it simple, choosing cost-effective seasonal ingredients, and limiting the use of added fats, salts, and sugars.  Cooking - Breakfast and Snacks  Clinical staff conducted group or individual video education with verbal and written material and guidebook.  Patient learns how important breakfast is to satiety and nutrition through the entire day. Recommendations include key foods to eat during breakfast to help stabilize blood sugar levels and to prevent overeating at meals later in the day. Planning ahead is also a key component.  Cooking - Educational Psychologist conducted group or individual video education with verbal and written material and guidebook.  Patient learns eating strategies to improve overall health, including an approach to cook more at home. Recommendations include thinking of animal protein as a side on your plate rather than center stage and focusing instead on lower calorie dense options like vegetables, fruits, whole grains, and plant-based proteins, such as beans. Making sauces in large quantities to freeze for later and leaving the skin on your vegetables are also recommended to maximize your experience.  Cooking - Healthy Salads and Dressing Clinical staff conducted group or individual  video education with verbal and written material and guidebook.  Patient learns that vegetables, fruits, whole grains, and legumes are the foundations of the Pritikin Eating Plan.  Recommendations include how to incorporate each of these in flavorful and healthy salads, and how to create homemade salad dressings. Proper handling of ingredients is also covered. Cooking - Soups and [employer] - Soups and Desserts Clinical staff conducted group or individual video education with verbal and written material and guidebook.  Patient learns that Pritikin soups and desserts make for easy, nutritious, and delicious snacks and meal components that are low in sodium, fat, sugar, and calorie density, while high in vitamins, minerals, and filling fiber. Recommendations include simple and healthy ideas for soups and desserts.   Overview     The Pritikin Solution Program Overview Clinical staff conducted group or individual video education with verbal and written material and guidebook.  Patient learns that the results of the Pritikin Program have been documented in more than 100 articles published in peer-reviewed journals, and the benefits include reducing risk factors for (and, in some cases, even reversing) high cholesterol, high blood pressure, type 2 diabetes, obesity, and more! An overview of the three key pillars of the Pritikin Program will be covered: eating well, doing regular exercise, and having a healthy mind-set.  WORKSHOPS  Exercise: Exercise Basics: Building Your Action Plan Clinical staff led group instruction and group discussion with PowerPoint presentation and patient guidebook. To enhance the learning environment the use of posters, models and videos may be added. At the conclusion of this workshop, patients will comprehend the difference between physical activity and exercise, as well as the benefits of incorporating both, into their routine. Patients will understand the FITT (Frequency, Intensity, Time, and Type) principle and how to use it to build an exercise action plan. In addition, safety concerns and other considerations for exercise and cardiac rehab  will be addressed by the presenter. The purpose of this lesson is to promote a comprehensive and effective weekly exercise routine in order to improve patients overall level of fitness.   Managing Heart Disease: Your Path to a Healthier Heart Clinical staff led group instruction and group discussion with PowerPoint presentation and patient guidebook. To enhance the learning environment the use of posters, models and videos may be added.At the conclusion of this workshop, patients will understand the anatomy and physiology of the heart. Additionally, they will understand how Pritikins three pillars impact the risk factors, the progression, and the management of heart disease.  The purpose of this lesson is to provide a high-level overview of the heart, heart disease, and how the Pritikin lifestyle positively impacts risk factors.  Exercise Biomechanics Clinical staff led group instruction and group discussion with PowerPoint presentation and patient guidebook. To enhance the learning environment the use of posters, models and videos may be added. Patients will learn how the structural parts of their bodies function and how these functions impact their daily activities, movement, and exercise. Patients will learn how to promote a neutral spine, learn how to manage pain, and identify ways to improve their physical movement in order to promote healthy living. The purpose of this lesson is to expose patients to common physical limitations that impact physical activity. Participants will learn practical ways to adapt and manage aches and pains, and to minimize their effect on regular exercise. Patients will learn how to maintain good posture while sitting, walking, and lifting.  Balance Training and Fall Prevention  Clinical staff led group  instruction and group discussion with PowerPoint presentation and patient guidebook. To enhance the learning environment the use of posters, models and videos  may be added. At the conclusion of this workshop, patients will understand the importance of their sensorimotor skills (vision, proprioception, and the vestibular system) in maintaining their ability to balance as they age. Patients will apply a variety of balancing exercises that are appropriate for their current level of function. Patients will understand the common causes for poor balance, possible solutions to these problems, and ways to modify their physical environment in order to minimize their fall risk. The purpose of this lesson is to teach patients about the importance of maintaining balance as they age and ways to minimize their risk of falling.  WORKSHOPS   Nutrition:  Fueling a Ship Broker led group instruction and group discussion with PowerPoint presentation and patient guidebook. To enhance the learning environment the use of posters, models and videos may be added. Patients will review the foundational principles of the Pritikin Eating Plan and understand what constitutes a serving size in each of the food groups. Patients will also learn Pritikin-friendly foods that are better choices when away from home and review make-ahead meal and snack options. Calorie density will be reviewed and applied to three nutrition priorities: weight maintenance, weight loss, and weight gain. The purpose of this lesson is to reinforce (in a group setting) the key concepts around what patients are recommended to eat and how to apply these guidelines when away from home by planning and selecting Pritikin-friendly options. Patients will understand how calorie density may be adjusted for different weight management goals.  Mindful Eating  Clinical staff led group instruction and group discussion with PowerPoint presentation and patient guidebook. To enhance the learning environment the use of posters, models and videos may be added. Patients will briefly review the concepts of the Pritikin  Eating Plan and the importance of low-calorie dense foods. The concept of mindful eating will be introduced as well as the importance of paying attention to internal hunger signals. Triggers for non-hunger eating and techniques for dealing with triggers will be explored. The purpose of this lesson is to provide patients with the opportunity to review the basic principles of the Pritikin Eating Plan, discuss the value of eating mindfully and how to measure internal cues of hunger and fullness using the Hunger Scale. Patients will also discuss reasons for non-hunger eating and learn strategies to use for controlling emotional eating.  Targeting Your Nutrition Priorities Clinical staff led group instruction and group discussion with PowerPoint presentation and patient guidebook. To enhance the learning environment the use of posters, models and videos may be added. Patients will learn how to determine their genetic susceptibility to disease by reviewing their family history. Patients will gain insight into the importance of diet as part of an overall healthy lifestyle in mitigating the impact of genetics and other environmental insults. The purpose of this lesson is to provide patients with the opportunity to assess their personal nutrition priorities by looking at their family history, their own health history and current risk factors. Patients will also be able to discuss ways of prioritizing and modifying the Pritikin Eating Plan for their highest risk areas  Menu  Clinical staff led group instruction and group discussion with PowerPoint presentation and patient guidebook. To enhance the learning environment the use of posters, models and videos may be added. Using menus brought in from [employer], or printed from [employer], patients will  apply the Pritikin dining out guidelines that were presented in the [employer] video. Patients will also be able to practice these guidelines  in a variety of provided scenarios. The purpose of this lesson is to provide patients with the opportunity to practice hands-on learning of the Pritikin Dining Out guidelines with actual menus and practice scenarios.  Label Reading Clinical staff led group instruction and group discussion with PowerPoint presentation and patient guidebook. To enhance the learning environment the use of posters, models and videos may be added. Patients will review and discuss the Pritikin label reading guidelines presented in Pritikins Label Reading Educational series video. Using fool labels brought in from local grocery stores and markets, patients will apply the label reading guidelines and determine if the packaged food meet the Pritikin guidelines. The purpose of this lesson is to provide patients with the opportunity to review, discuss, and practice hands-on learning of the Pritikin Label Reading guidelines with actual packaged food labels. Cooking School  Pritikins [employer] are designed to teach patients ways to prepare quick, simple, and affordable recipes at home. The importance of nutritions role in chronic disease risk reduction is reflected in its emphasis in the overall Pritikin program. By learning how to prepare essential core Pritikin Eating Plan recipes, patients will increase control over what they eat; be able to customize the flavor of foods without the use of added salt, sugar, or fat; and improve the quality of the food they consume. By learning a set of core recipes which are easily assembled, quickly prepared, and affordable, patients are more likely to prepare more healthy foods at home. These workshops focus on convenient breakfasts, simple entres, side dishes, and desserts which can be prepared with minimal effort and are consistent with nutrition recommendations for cardiovascular risk reduction. Cooking [employer] are taught by a armed forces logistics/support/administrative officer (RD) who has  been trained by the [employer]. The chef or RD has a clear understanding of the importance of minimizing - if not completely eliminating - added fat, sugar, and sodium in recipes. Throughout the series of Cooking School Workshop sessions, patients will learn about healthy ingredients and efficient methods of cooking to build confidence in their capability to prepare    Cooking School weekly topics:  Adding Flavor- Sodium-Free  Fast and Healthy Breakfasts  Powerhouse Plant-Based Proteins  Satisfying Salads and Dressings  Simple Sides and Sauces  International Cuisine-Spotlight on the [employer] Zones  Delicious Desserts  Savory Soups  [employer] - Meals in a Astronomer Appetizers and Snacks  Comforting Weekend Breakfasts  One-Pot Wonders   Fast Evening Meals  Landscape Architect Your Pritikin Plate  WORKSHOPS   Healthy Mindset (Psychosocial):  Focused Goals, Sustainable Changes Clinical staff led group instruction and group discussion with PowerPoint presentation and patient guidebook. To enhance the learning environment the use of posters, models and videos may be added. Patients will be able to apply effective goal setting strategies to establish at least one personal goal, and then take consistent, meaningful action toward that goal. They will learn to identify common barriers to achieving personal goals and develop strategies to overcome them. Patients will also gain an understanding of how our mind-set can impact our ability to achieve goals and the importance of cultivating a positive and growth-oriented mind-set. The purpose of this lesson is to provide patients with a deeper understanding of how to set and achieve personal goals, as well as the tools and  strategies needed to overcome common obstacles which may arise along the way.  From Head to Heart: The Power of a Healthy Outlook  Clinical staff led group instruction and group discussion with  PowerPoint presentation and patient guidebook. To enhance the learning environment the use of posters, models and videos may be added. Patients will be able to recognize and describe the impact of emotions and mood on physical health. They will discover the importance of self-care and explore self-care practices which may work for them. Patients will also learn how to utilize the 4 Cs to cultivate a healthier outlook and better manage stress and challenges. The purpose of this lesson is to demonstrate to patients how a healthy outlook is an essential part of maintaining good health, especially as they continue their cardiac rehab journey.  Healthy Sleep for a Healthy Heart Clinical staff led group instruction and group discussion with PowerPoint presentation and patient guidebook. To enhance the learning environment the use of posters, models and videos may be added. At the conclusion of this workshop, patients will be able to demonstrate knowledge of the importance of sleep to overall health, well-being, and quality of life. They will understand the symptoms of, and treatments for, common sleep disorders. Patients will also be able to identify daytime and nighttime behaviors which impact sleep, and they will be able to apply these tools to help manage sleep-related challenges. The purpose of this lesson is to provide patients with a general overview of sleep and outline the importance of quality sleep. Patients will learn about a few of the most common sleep disorders. Patients will also be introduced to the concept of sleep hygiene, and discover ways to self-manage certain sleeping problems through simple daily behavior changes. Finally, the workshop will motivate patients by clarifying the links between quality sleep and their goals of heart-healthy living.   Recognizing and Reducing Stress Clinical staff led group instruction and group discussion with PowerPoint presentation and patient guidebook. To  enhance the learning environment the use of posters, models and videos may be added. At the conclusion of this workshop, patients will be able to understand the types of stress reactions, differentiate between acute and chronic stress, and recognize the impact that chronic stress has on their health. They will also be able to apply different coping mechanisms, such as reframing negative self-talk. Patients will have the opportunity to practice a variety of stress management techniques, such as deep abdominal breathing, progressive muscle relaxation, and/or guided imagery.  The purpose of this lesson is to educate patients on the role of stress in their lives and to provide healthy techniques for coping with it.  Learning Barriers/Preferences:  Learning Barriers/Preferences - 06/04/24 1457       Learning Barriers/Preferences   Learning Barriers Hearing;Reading   dyslexia makes reading challenging   Learning Preferences Computer/Internet;Group Instruction;Individual Instruction;Video          Education Topics:  Knowledge Questionnaire Score:  Knowledge Questionnaire Score - 06/04/24 1554       Knowledge Questionnaire Score   Pre Score 24/24          Core Components/Risk Factors/Patient Goals at Admission:  Personal Goals and Risk Factors at Admission - 06/04/24 1458       Core Components/Risk Factors/Patient Goals on Admission    Weight Management Yes;Weight Gain    Intervention Weight Management: Develop a combined nutrition and exercise program designed to reach desired caloric intake, while maintaining appropriate intake of nutrient and fiber, sodium and fats, and  appropriate energy expenditure required for the weight goal.;Weight Management: Provide education and appropriate resources to help participant work on and attain dietary goals.    Admit Weight 103 lb 2.8 oz (46.8 kg)    Expected Outcomes Short Term: Continue to assess and modify interventions until short term weight is  achieved;Long Term: Adherence to nutrition and physical activity/exercise program aimed toward attainment of established weight goal;Weight Gain: Understanding of general recommendations for a high calorie, high protein meal plan that promotes weight gain by distributing calorie intake throughout the day with the consumption for 4-5 meals, snacks, and/or supplements    Lipids Yes    Intervention Provide education and support for participant on nutrition & aerobic/resistive exercise along with prescribed medications to achieve LDL 70mg , HDL >40mg .    Expected Outcomes Short Term: Participant states understanding of desired cholesterol values and is compliant with medications prescribed. Participant is following exercise prescription and nutrition guidelines.;Long Term: Cholesterol controlled with medications as prescribed, with individualized exercise RX and with personalized nutrition plan. Value goals: LDL < 70mg , HDL > 40 mg.    Stress Yes    Intervention Offer individual and/or small group education and counseling on adjustment to heart disease, stress management and health-related lifestyle change. Teach and support self-help strategies.;Refer participants experiencing significant psychosocial distress to appropriate mental health specialists for further evaluation and treatment. When possible, include family members and significant others in education/counseling sessions.    Expected Outcomes Short Term: Participant demonstrates changes in health-related behavior, relaxation and other stress management skills, ability to obtain effective social support, and compliance with psychotropic medications if prescribed.;Long Term: Emotional wellbeing is indicated by absence of clinically significant psychosocial distress or social isolation.          Core Components/Risk Factors/Patient Goals Review:    Core Components/Risk Factors/Patient Goals at Discharge (Final Review):    ITP Comments:  ITP  Comments     Row Name 06/04/24 1307           ITP Comments Medical Director- Dr. Wilbert Bihari, MD. Introduction to the Pritikin Education/ Intensive Cardiac Rehab Program. Reviewed initial orientation folder with Burnard.          Comments: Zariana attended orientation for the cardiac rehabilitation program on  06/04/2024  to perform initial intake and exercise walk test. She was introduced to the [employer] education and orientation packet was reviewed. Completed 6-minute walk test, measurements, initial ITP, and exercise prescription. Vital signs stable. Telemetry-normal sinus rhythm, mild shortness of breath during the walk test. Resolved with rest.   Service time was from 1307 to 1538. Arnoldo CHRISTELLA Gal, MS, ACSM CEP 06/04/2024 1557        [1]  Current Outpatient Medications:    aspirin  81 MG chewable tablet, Chew 81 mg by mouth daily., Disp: , Rfl:    budesonide  (ENTOCORT EC ) 3 MG 24 hr capsule, Take 3 mg by mouth., Disp: , Rfl:    Cholecalciferol (VITAMIN D3) 125 MCG (5000 UT) CAPS, Take 5,000 Units by mouth daily. , Disp: , Rfl:    cyanocobalamin  2000 MCG tablet, Take 2,000 mcg by mouth daily., Disp: , Rfl:    folic acid  (FOLVITE ) 1 MG tablet, Take 1 tablet (1 mg total) by mouth daily., Disp: , Rfl:    levothyroxine  (SYNTHROID ) 100 MCG tablet, Take 100 mcg by mouth daily., Disp: , Rfl:    metoprolol succinate (TOPROL-XL) 25 MG 24 hr tablet, Take 25 mg by mouth daily., Disp: , Rfl:    Multiple  Vitamin (MULTIVITAMIN) tablet, Take 1 tablet by mouth daily., Disp: , Rfl:    nitroGLYCERIN  (NITROSTAT ) 0.4 MG SL tablet, Place 1 tablet (0.4 mg total) under the tongue every 5 (five) minutes x 3 doses as needed for chest pain (if no relief after 3rd dose, proceed to ED or call 911)., Disp: 25 tablet, Rfl: 1   NURTEC 75 MG TBDP, TAKE 1 TABLET (75 MG TOTAL) BY MOUTH EVERY OTHER DAY (Patient taking differently: Take 75 mg by mouth as needed (for migraines).), Disp: 16 tablet, Rfl: 3    ondansetron  (ZOFRAN -ODT) 4 MG disintegrating tablet, Take 1 tablet (4 mg total) by mouth every 8 (eight) hours as needed for nausea or vomiting., Disp: 10 tablet, Rfl: 0   pantoprazole  (PROTONIX ) 40 MG tablet, Take 1 tablet (40 mg total) by mouth daily., Disp: 90 tablet, Rfl: 3   prasugrel  (EFFIENT ) 10 MG TABS tablet, Take 60 mg (6 tabs) first day then 10 mg (1 tab) Daily, Disp: 36 tablet, Rfl: 3   topiramate  (TOPAMAX ) 100 MG tablet, Take 100 mg by mouth 2 (two) times daily., Disp: , Rfl:    rosuvastatin (CRESTOR) 20 MG tablet, 1 tablet Orally Once a day for 90 days for cholesterol (Patient not taking: Reported on 06/04/2024), Disp: , Rfl:  [2]  Social History Tobacco Use  Smoking Status Some Days   Current packs/day: 0.00   Types: Cigarettes   Last attempt to quit: 02/20/2016   Years since quitting: 8.2  Smokeless Tobacco Never

## 2024-06-04 NOTE — Progress Notes (Signed)
 Cardiac Rehab Medication Review   Does the patient  feel that his/her medications are working for him/her?  yes  Has the patient been experiencing any side effects to the medications prescribed?  no  Does the patient measure his/her own blood pressure or blood glucose at home?  Yes, she checks her BP daily  Does the patient have any problems obtaining medications due to transportation or finances?   no  Understanding of regimen: excellent Understanding of indications: excellent Potential of compliance: excellent    Comments: Angelica Stone is taking Atorvastatin 40 mg daily instead of Crestor. She's taking Tylenol  650 mg 2 tablets as needed for headaches.    Angelica Stone 06/04/2024 2:16 PM

## 2024-06-10 ENCOUNTER — Telehealth (HOSPITAL_COMMUNITY): Payer: Self-pay | Admitting: *Deleted

## 2024-06-10 ENCOUNTER — Encounter (HOSPITAL_COMMUNITY)
Admission: RE | Admit: 2024-06-10 | Discharge: 2024-06-10 | Disposition: A | Discharge status: Home / Self Care | Source: Ambulatory Visit | Attending: Cardiology | Admitting: Cardiology

## 2024-06-10 DIAGNOSIS — I252 Old myocardial infarction: Secondary | ICD-10-CM | POA: Insufficient documentation

## 2024-06-10 DIAGNOSIS — I214 Non-ST elevation (NSTEMI) myocardial infarction: Secondary | ICD-10-CM

## 2024-06-10 DIAGNOSIS — Z955 Presence of coronary angioplasty implant and graft: Secondary | ICD-10-CM | POA: Insufficient documentation

## 2024-06-10 DIAGNOSIS — Z48812 Encounter for surgical aftercare following surgery on the circulatory system: Secondary | ICD-10-CM | POA: Insufficient documentation

## 2024-06-10 NOTE — Progress Notes (Addendum)
 Incomplete Session Note  Patient Details  Name: Angelica Stone MRN: [id number] Date of Birth: [birth year] Referring Provider:   Flowsheet Row INTENSIVE CARDIAC REHAB ORIENT from 06/04/2024 in [facility]  Referring Provider Zenaida Morene PARAS, MD    Angelica Stone did not complete her rehab session.   Pre exercise bp 88/50 recheck bp 91/65 sitting and 89/46.  Per pt she is not symptomatic and other than a mild headache feels generally ok.  Pt  ambulated to consult room denies any dizziness or feeling light headed.  Joined by her sister who is in healthcare. Per sister, pt bp historically run in the 90's systolic.   Reviewed medications.  Pt took all of her am meds and drank ensure - pt is trying to gain weight drinks several cans throughout the day.  Pt did not have anything else to eat as she intended to eat a banana and have some toast but ran out of time.  Pt took two tylenol  due to having a mild headache which she attributes to not sleeping well the night before.  History of Migraines however this is very mild in comparison.  Pt given water and graham crackers with peanut butter.  Recheck after eating sitting 98/60 and standing 96/60.  Denies any symptoms and reports the headache is easing off. Advised pt to increase caloric intake prior to exercise.  Ok to bring snack to have during education or exercise.  Also reminded that she should drink water even with the can of ensure.  This may help with her headaches.  Pt and sister verbalized understanding and are looking forward to returning on Wednesday.  Reviewed with onsite provider - Katlyn West NP.   Will forward this note to Dr Zenaida and ask for BP parameters that are appropriate for this pt. Saturnino Bernett PEAK, BSN Cardiac and Emergency Planning/management Officer

## 2024-06-10 NOTE — Telephone Encounter (Signed)
-----   Message from Morene Brownie, MD sent at 06/10/2024 12:10 PM EST ----- Systolic BP >80/50 ----- Message ----- From: Bernett Saturnino NOVAK, RN Sent: 06/10/2024  12:03 PM EST To: Morene JINNY Brownie, MD  FYI.  Could we get asymptomatic bp parameters for this pt? Thanks Kam Kushnir

## 2024-06-12 ENCOUNTER — Encounter (HOSPITAL_COMMUNITY)
Admission: RE | Admit: 2024-06-12 | Discharge: 2024-06-12 | Disposition: A | Discharge status: Home / Self Care | Source: Ambulatory Visit | Attending: Cardiology | Admitting: Cardiology

## 2024-06-12 DIAGNOSIS — Z955 Presence of coronary angioplasty implant and graft: Secondary | ICD-10-CM

## 2024-06-12 DIAGNOSIS — I252 Old myocardial infarction: Secondary | ICD-10-CM | POA: Diagnosis not present

## 2024-06-12 DIAGNOSIS — I214 Non-ST elevation (NSTEMI) myocardial infarction: Secondary | ICD-10-CM | POA: Diagnosis present

## 2024-06-12 DIAGNOSIS — Z48812 Encounter for surgical aftercare following surgery on the circulatory system: Secondary | ICD-10-CM | POA: Diagnosis not present

## 2024-06-12 NOTE — Progress Notes (Signed)
 Daily Session Note  Patient Details  Name: Angelica Stone MRN: [id number] Date of Birth: Jul 15, 1955 Referring Provider:   Flowsheet Row INTENSIVE CARDIAC REHAB ORIENT from 06/04/2024 in [facility]  Referring Provider Angelica Morene PARAS, MD    Encounter Date: 06/12/2024  Check In:  Session Check In - 06/12/24 1000       Check-In   Supervising physician immediately available to respond to emergencies [facility] MD immediately available    Physician(s) Angelica Braver, NP    Location [facility]    Staff Present Angelica Gal, MS, ACSM-CEP, Exercise Physiologist;Angelica Stone BS, ACSM-CEP, Exercise Physiologist;Angelica Brinkmeier, RN, Angelica Parkins, MS, ACSM-CEP, CCRP, Exercise Physiologist;Angelica Lennon, RN, BSN    Virtual Visit No    Medication changes reported     No    Fall or balance concerns reported    No    Tobacco Cessation No Change    Warm-up and Cool-down Performed as group-led instruction    Resistance Training Performed No    VAD Patient? No    PAD/SET Patient? No      Pain Assessment   Currently in Pain? No/denies    Pain Score 0-No pain    Multiple Pain Sites No          Capillary Blood Glucose: No results found for this or any previous visit (from the past 24 hours).   Exercise Prescription Changes - 06/12/24 1024       Response to Exercise   Blood Pressure (Admit) 90/58    Blood Pressure (Exercise) 114/82    Blood Pressure (Exit) 92/62    Heart Rate (Admit) 74 bpm    Heart Rate (Exercise) 92 bpm    Heart Rate (Exit) 71 bpm    Rating of Perceived Exertion (Exercise) 10    Symptoms None    Comments Off to a good start with exercise.    Duration Continue with 30 min of aerobic exercise without signs/symptoms of physical distress.    Intensity THRR unchanged      Progression   Progression Continue to progress workloads to maintain intensity without signs/symptoms of physical  distress.    Average METs 1.9      Resistance Training   Training Prescription No    Weight Relaxation day. No weights.      Interval Training   Interval Training No      Recumbant Bike   Level 1    RPM 56    Watts 12    Minutes 15    METs 1.9      NuStep   Level 1    SPM 52    Minutes 15    METs 1.9          Tobacco Use History[1]  Goals Met:  Exercise tolerated well No report of concerns or symptoms today  Goals Unmet:  Not Applicable  Comments: Angelica Stone started cardiac rehab today.  Pt tolerated light exercise without difficulty. VSS, telemetry-Sinus Rhythm, asymptomatic.  Medication list reconciled. Pt denies barriers to medicaiton compliance.  PSYCHOSOCIAL ASSESSMENT:  PHQ-13. Pt exhibits positive coping skills, hopeful outlook with supportive family. No psychosocial needs identified at this time, no psychosocial interventions necessary.    Pt enjoys shopping with sister, word puzzles and working in the yard.   Pt oriented to exercise equipment and routine.    Understanding verbalized. Parameters obtained for Burnard to exercise as long as BP is greater than 80/50. 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     [1]  Social History Tobacco Use  Smoking Status Some Days   Current packs/day: 0.00   Types: Cigarettes   Last attempt to quit: 02/20/2016   Years since quitting: 8.3  Smokeless Tobacco Never

## 2024-06-14 ENCOUNTER — Encounter (HOSPITAL_COMMUNITY)
Admission: RE | Admit: 2024-06-14 | Discharge: 2024-06-14 | Disposition: A | Source: Ambulatory Visit | Attending: Cardiology

## 2024-06-14 DIAGNOSIS — Z955 Presence of coronary angioplasty implant and graft: Secondary | ICD-10-CM | POA: Diagnosis not present

## 2024-06-14 DIAGNOSIS — I214 Non-ST elevation (NSTEMI) myocardial infarction: Secondary | ICD-10-CM

## 2024-06-14 NOTE — Progress Notes (Addendum)
 QUALITY OF LIFE SCORE REVIEW  Pt completed Quality of Life survey as a participant in Cardiac Rehab and PHQ9 .  Scores 21.0 or below are considered low.  Pt score very low in several areas Overall 17.34, Health and Function 14.92, socioeconomic 20.25, physiological and spiritual 15.64, family 26.0. Patient quality of life slightly altered by physical constraints which limits ability to perform as prior to recent cardiac illness. Caytlin says that she is not as depressed as she has recently started an antidepressant, Nawaal says that she has a previous negative counseling experience.Nupur says that her energy level has improved since she has been participating in cardiac rehab. Nitika says she feel stressed at times as she has a poor appetite. Discussed eating small meals during the day as she can tolerate. Offered emotional support and reassurance. Encouraged Burnard to let us  know if she changes her mind and would like counseling.  Will continue to monitor and intervene as necessary. [name] Elpidio Quan RN BSN

## 2024-06-17 ENCOUNTER — Encounter (HOSPITAL_COMMUNITY)
Admission: RE | Admit: 2024-06-17 | Discharge: 2024-06-17 | Disposition: A | Source: Ambulatory Visit | Attending: Cardiology

## 2024-06-17 DIAGNOSIS — Z955 Presence of coronary angioplasty implant and graft: Secondary | ICD-10-CM

## 2024-06-17 DIAGNOSIS — I214 Non-ST elevation (NSTEMI) myocardial infarction: Secondary | ICD-10-CM

## 2024-06-19 ENCOUNTER — Encounter (HOSPITAL_COMMUNITY)
Admission: RE | Admit: 2024-06-19 | Discharge: 2024-06-19 | Disposition: A | Discharge status: Home / Self Care | Source: Ambulatory Visit | Attending: Cardiology | Admitting: Cardiology

## 2024-06-19 ENCOUNTER — Ambulatory Visit: Admitting: Gastroenterology

## 2024-06-19 DIAGNOSIS — I214 Non-ST elevation (NSTEMI) myocardial infarction: Secondary | ICD-10-CM

## 2024-06-19 DIAGNOSIS — Z955 Presence of coronary angioplasty implant and graft: Secondary | ICD-10-CM

## 2024-06-20 ENCOUNTER — Telehealth (HOSPITAL_COMMUNITY): Payer: Self-pay | Admitting: Licensed Clinical Social Worker

## 2024-06-20 NOTE — Telephone Encounter (Signed)
 CSW received call from pt requesting help getting disability paperwork to clinic.  Pt emailed CSW paperwork- CSW printed and placed in Dr. Maisie box for completion by staff.  Andriette HILARIO Leech, LCSW Clinical Social Worker [facility] Desk#: [phone] Cell#: [phone]

## 2024-06-21 ENCOUNTER — Encounter (HOSPITAL_COMMUNITY)
Admission: RE | Admit: 2024-06-21 | Discharge: 2024-06-21 | Disposition: A | Discharge status: Home / Self Care | Source: Ambulatory Visit | Attending: Cardiology | Admitting: Cardiology

## 2024-06-21 DIAGNOSIS — Z955 Presence of coronary angioplasty implant and graft: Secondary | ICD-10-CM

## 2024-06-21 DIAGNOSIS — I214 Non-ST elevation (NSTEMI) myocardial infarction: Secondary | ICD-10-CM

## 2024-06-24 ENCOUNTER — Encounter (HOSPITAL_COMMUNITY)
Admission: RE | Admit: 2024-06-24 | Discharge: 2024-06-24 | Disposition: A | Source: Ambulatory Visit | Attending: Cardiology

## 2024-06-24 DIAGNOSIS — I214 Non-ST elevation (NSTEMI) myocardial infarction: Secondary | ICD-10-CM

## 2024-06-24 DIAGNOSIS — Z955 Presence of coronary angioplasty implant and graft: Secondary | ICD-10-CM

## 2024-06-26 ENCOUNTER — Encounter (HOSPITAL_COMMUNITY)
Admission: RE | Admit: 2024-06-26 | Discharge: 2024-06-26 | Disposition: A | Source: Ambulatory Visit | Attending: Cardiology

## 2024-06-26 DIAGNOSIS — I214 Non-ST elevation (NSTEMI) myocardial infarction: Secondary | ICD-10-CM

## 2024-06-26 DIAGNOSIS — Z955 Presence of coronary angioplasty implant and graft: Secondary | ICD-10-CM

## 2024-06-28 ENCOUNTER — Encounter (HOSPITAL_COMMUNITY)
Admission: RE | Admit: 2024-06-28 | Discharge: 2024-06-28 | Disposition: A | Source: Ambulatory Visit | Attending: Cardiology

## 2024-06-28 DIAGNOSIS — I214 Non-ST elevation (NSTEMI) myocardial infarction: Secondary | ICD-10-CM

## 2024-06-28 DIAGNOSIS — Z955 Presence of coronary angioplasty implant and graft: Secondary | ICD-10-CM | POA: Diagnosis not present

## 2024-07-01 ENCOUNTER — Encounter (HOSPITAL_COMMUNITY)

## 2024-07-02 NOTE — Progress Notes (Signed)
 Cardiac Individual Treatment Plan  Patient Details  Name: Angelica Stone MRN: [id number] Date of Birth: March 12, 1956 Referring Provider:   Flowsheet Row INTENSIVE CARDIAC REHAB ORIENT from 06/04/2024 in [facility]  Referring Provider Zenaida Morene PARAS, MD    Initial Encounter Date:  Flowsheet Row INTENSIVE CARDIAC REHAB ORIENT from 06/04/2024 in [facility], [facility]  Date 06/04/24    Visit Diagnosis: 04/12/24 NSTEMI  04/12/24 DES LAD  Patient's Home Medications on Admission: Current Medications[1]  Past Medical History: Past Medical History:  Diagnosis Date   Allergy    Anxiety    Arthritis    Collagenous colitis    Depression    Gallstone    History of kidney stones    Hypothyroidism    Migraines    MVP (mitral valve prolapse)    early 20's   Myocardial infarct (HCC)    Pneumonia    Swelling of both ankles    Varicose veins of left leg with edema     Tobacco Use: Tobacco Use History[2]  Labs: Review Flowsheet        No data to display          Capillary Blood Glucose: No results found for: GLUCAP   Exercise Target Goals: Exercise Program Goal: Individual exercise prescription set using results from initial 6 min walk test and THRR while considering  patients activity barriers and safety.   Exercise Prescription Goal: Initial exercise prescription builds to 30-45 minutes a day of aerobic activity, 2-3 days per week.  Home exercise guidelines will be given to patient during program as part of exercise prescription that the participant will acknowledge.  Activity Barriers & Risk Stratification:  Activity Barriers & Cardiac Risk Stratification - 06/04/24 1454       Activity Barriers & Cardiac Risk Stratification   Activity Barriers Left Hip Replacement;Neck/Spine Problems;Shortness of Breath;Balance Concerns;History of Falls;Assistive Device     Cardiac Risk Stratification High          6 Minute Walk:  6 Minute Walk     Row Name 06/04/24 1524         6 Minute Walk   Phase Initial     Distance 1320 feet     Walk Time 6 minutes     # of Rest Breaks 0     MPH 2.5     METS 3.7     RPE 12     Perceived Dyspnea  1.5     VO2 Peak 12.94     Symptoms Yes (comment)     Comments Mild shortness of breath.     Resting HR 64 bpm     Resting BP 96/68     Resting Oxygen Saturation  100 %     Exercise Oxygen Saturation  during 6 min walk 100 %     Max Ex. HR 91 bpm     Max Ex. BP 108/76     2 Minute Post BP 110/78        Oxygen Initial Assessment:   Oxygen Re-Evaluation:   Oxygen Discharge (Final Oxygen Re-Evaluation):   Initial Exercise Prescription:  Initial Exercise Prescription - 06/04/24 1500       Date of Initial Exercise RX and Referring Provider   Date 06/04/24    Referring Provider Zenaida Morene PARAS, MD    Expected Discharge Date 08/28/24      Recumbant Bike  Level 1    RPM 20    Watts 15    Minutes 15    METs 2.3      NuStep   Level 1    SPM 75    Minutes 15    METs 2.1      Prescription Details   Frequency (times per week) 3    Duration Progress to 30 minutes of continuous aerobic without signs/symptoms of physical distress      Intensity   THRR 40-80% of Max Heartrate 62-123    Ratings of Perceived Exertion 11-13    Perceived Dyspnea 0-4      Progression   Progression Continue to progress workloads to maintain intensity without signs/symptoms of physical distress.      Resistance Training   Training Prescription Yes    Weight 2 lbs    Reps 10-15          Perform Capillary Blood Glucose checks as needed.  Exercise Prescription Changes:   Exercise Prescription Changes     Row Name 06/12/24 1024 06/17/24 1024 06/28/24 1018         Response to Exercise   Blood Pressure (Admit) 90/58 110/62 98/60     Blood Pressure (Exercise) 114/82 102/68 98/50     Blood  Pressure (Exit) 92/62 94/60 95/64      Heart Rate (Admit) 74 bpm 70 bpm 104 bpm     Heart Rate (Exercise) 92 bpm 109 bpm 96 bpm     Heart Rate (Exit) 71 bpm 84 bpm 84 bpm     Rating of Perceived Exertion (Exercise) 10 11 12      Symptoms None None Tired today. Did not sleep well last night.     Comments Off to a good start with exercise. -- --     Duration Continue with 30 min of aerobic exercise without signs/symptoms of physical distress. Continue with 30 min of aerobic exercise without signs/symptoms of physical distress. Continue with 30 min of aerobic exercise without signs/symptoms of physical distress.     Intensity THRR unchanged THRR unchanged THRR unchanged       Progression   Progression Continue to progress workloads to maintain intensity without signs/symptoms of physical distress. Continue to progress workloads to maintain intensity without signs/symptoms of physical distress. Continue to progress workloads to maintain intensity without signs/symptoms of physical distress.     Average METs 1.9 2.3 2.7       Resistance Training   Training Prescription No Yes No     Weight Relaxation day. No weights. 2 lbs No weights today.     Reps -- 10-15 --     Time -- 5 Minutes --       Interval Training   Interval Training No No No       Recumbant Bike   Level 1 1 2      RPM 56 67 63     Watts 12 17 18      Minutes 15 15 15      METs 1.9 2.3 2.7       NuStep   Level 1 1 1      SPM 52 69 78     Minutes 15 15 15      METs 1.9 2.3 2.8        Exercise Comments:   Exercise Comments     Row Name 06/12/24 1123           Exercise Comments Perrie tolerated low intensity exercise well without symptoms. She was  introduced to the exercise equipment and stretching routine.          Exercise Goals and Review:   Exercise Goals     Row Name 06/04/24 1450             Exercise Goals   Increase Physical Activity Yes       Intervention Provide advice, education, support and  counseling about physical activity/exercise needs.;Develop an individualized exercise prescription for aerobic and resistive training based on initial evaluation findings, risk stratification, comorbidities and participant's personal goals.       Expected Outcomes Short Term: Attend rehab on a regular basis to increase amount of physical activity.;Long Term: Add in home exercise to make exercise part of routine and to increase amount of physical activity.;Long Term: Exercising regularly at least 3-5 days a week.       Increase Strength and Stamina Yes       Intervention Provide advice, education, support and counseling about physical activity/exercise needs.;Develop an individualized exercise prescription for aerobic and resistive training based on initial evaluation findings, risk stratification, comorbidities and participant's personal goals.       Expected Outcomes Short Term: Increase workloads from initial exercise prescription for resistance, speed, and METs.;Short Term: Perform resistance training exercises routinely during rehab and add in resistance training at home;Long Term: Improve cardiorespiratory fitness, muscular endurance and strength as measured by increased METs and functional capacity ( )       Able to understand and use rate of perceived exertion (RPE) scale Yes       Intervention Provide education and explanation on how to use RPE scale       Expected Outcomes Short Term: Able to use RPE daily in rehab to express subjective intensity level;Long Term:  Able to use RPE to guide intensity level when exercising independently       Knowledge and understanding of Target Heart Rate Range (THRR) Yes       Intervention Provide education and explanation of THRR including how the numbers were predicted and where they are located for reference       Expected Outcomes Short Term: Able to state/look up THRR;Long Term: Able to use THRR to govern intensity when exercising independently;Short Term:  Able to use daily as guideline for intensity in rehab       Able to check pulse independently Yes       Intervention Provide education and demonstration on how to check pulse in carotid and radial arteries.;Review the importance of being able to check your own pulse for safety during independent exercise       Expected Outcomes Short Term: Able to explain why pulse checking is important during independent exercise;Long Term: Able to check pulse independently and accurately       Understanding of Exercise Prescription Yes       Intervention Provide education, explanation, and written materials on patient's individual exercise prescription       Expected Outcomes Short Term: Able to explain program exercise prescription;Long Term: Able to explain home exercise prescription to exercise independently          Exercise Goals Re-Evaluation :  Exercise Goals Re-Evaluation     Row Name 06/12/24 1420             Exercise Goal Re-Evaluation   Exercise Goals Review Increase Physical Activity;Increase Strength and Stamina;Able to understand and use rate of perceived exertion (RPE) scale       Comments Griffin is able to understand and use  the RPE scale appropriately.       Expected Outcomes Progress workloads as tolerated to help improve cardiorespiratory fitness.          Discharge Exercise Prescription (Final Exercise Prescription Changes):  Exercise Prescription Changes - 06/28/24 1018       Response to Exercise   Blood Pressure (Admit) 98/60    Blood Pressure (Exercise) 98/50    Blood Pressure (Exit) 95/64    Heart Rate (Admit) 104 bpm    Heart Rate (Exercise) 96 bpm    Heart Rate (Exit) 84 bpm    Rating of Perceived Exertion (Exercise) 12    Symptoms Tired today. Did not sleep well last night.    Duration Continue with 30 min of aerobic exercise without signs/symptoms of physical distress.    Intensity THRR unchanged      Progression   Progression Continue to progress workloads to  maintain intensity without signs/symptoms of physical distress.    Average METs 2.7      Resistance Training   Training Prescription No    Weight No weights today.      Interval Training   Interval Training No      Recumbant Bike   Level 2    RPM 63    Watts 18    Minutes 15    METs 2.7      NuStep   Level 1    SPM 78    Minutes 15    METs 2.8          Nutrition:  Target Goals: Understanding of nutrition guidelines, daily intake of sodium 1500mg , cholesterol 200mg , calories 30% from fat and 7% or less from saturated fats, daily to have 5 or more servings of fruits and vegetables.  Biometrics:  Pre Biometrics - 06/04/24 1307       Pre Biometrics   Waist Circumference 25.5 inches    Hip Circumference 32.25 inches    Waist to Hip Ratio 0.79 %    Triceps Skinfold 11 mm    % Body Fat 24.1 %    Grip Strength 14 kg    Flexibility --   Not performed. Bilateral left hip replacement.   Single Leg Stand 30 seconds           Nutrition Therapy Plan and Nutrition Goals:  Nutrition Therapy & Goals - 06/12/24 1547       Nutrition Therapy   Diet Regular diet- mechanical soft      Personal Nutrition Goals   Nutrition Goal Patient to identify strategies for weight gain of 0.5-2 # per week.    Personal Goal #2 Patient to increase intake of plant-based foods such as fruit, vegetables, whole grains as tolerated.    Comments Patient with medical history significant for PMH hypothyroidism, colitis, anxiety, migraines, chronic systolic heart failure, CAD s/p recent LAD STEMI treated with PCI. Underweight based on BMI of 14.7 kg/m2. Pt states intake limited due to missing teeth; expects to receive permanent dentures soon. Pt reports liberalizing diet as much as tolerated based on cardiologist's recommendation as low body weight likely causing greater strain on heart. Pt currently supplementing diet with oral nutrition supplement with ~ 230 kcal per serving. Pt unable to consume  lactose-containing foods due to colitis, though able to consume cheese. RD provided suggestions to increase caloric intake with foods such as nut butters, avocados. Pt hopes to increase intake of nutrient-rich foods, especially once receives dentures. RD provided suggestions for adding fruit/veggies to soups, smoothies. Patient  will benefit from participation in intensive cardiac rehab for nutrition education, exercise, and lifestyle modification.      Intervention Plan   Intervention Prescribe, educate and counsel regarding individualized specific dietary modifications aiming towards targeted core components such as weight, hypertension, lipid management, diabetes, heart failure and other comorbidities.    Expected Outcomes Short Term Goal: Understand basic principles of dietary content, such as calories, fat, sodium, cholesterol and nutrients.;Long Term Goal: Adherence to prescribed nutrition plan.          Nutrition Assessments:  MEDIFICTS Score Key: >=70 Need to make dietary changes  40-70 Heart Healthy Diet <= 40 Therapeutic Level Cholesterol Diet   Flowsheet Row INTENSIVE CARDIAC REHAB from 06/14/2024 in [facility]  Picture Your Plate Total Score on Admission 62   Picture Your Plate Scores: <59 Unhealthy dietary pattern with much room for improvement. 41-50 Dietary pattern unlikely to meet recommendations for good health and room for improvement. 51-60 More healthful dietary pattern, with some room for improvement.  >60 Healthy dietary pattern, although there may be some specific behaviors that could be improved.    Nutrition Goals Re-Evaluation:   Nutrition Goals Re-Evaluation:   Nutrition Goals Discharge (Final Nutrition Goals Re-Evaluation):   Psychosocial: Target Goals: Acknowledge presence or absence of significant depression and/or stress, maximize coping skills, provide positive support system. Participant is  able to verbalize types and ability to use techniques and skills needed for reducing stress and depression.  Initial Review & Psychosocial Screening:  Initial Psych Review & Screening - 06/04/24 1455       Initial Review   Current issues with Current Depression;Current Anxiety/Panic;Current Stress Concerns    Source of Stress Concerns Financial;Family;Unable to perform yard/household activities;Unable to participate in former interests or hobbies    Comments Charonda has depression/ stress from health concerns and life in general. She was taking Celexa  but is unable to take because of arrhythmia. Waiting to hear if she can start Sertraline.      Family Dynamics   Good Support System? Yes    Comments Shatora has excellent support from her 3 sisters.      Barriers   Psychosocial barriers to participate in program Psychosocial barriers identified (see note);The patient should benefit from training in stress management and relaxation.      Screening Interventions   Interventions Encouraged to exercise;Provide feedback about the scores to participant;To provide support and resources with identified psychosocial needs    Expected Outcomes Short Term goal: Utilizing psychosocial counselor, staff and physician to assist with identification of specific Stressors or current issues interfering with healing process. Setting desired goal for each stressor or current issue identified.;Long Term Goal: Stressors or current issues are controlled or eliminated.;Short Term goal: Identification and review with participant of any Quality of Life or Depression concerns found by scoring the questionnaire.;Long Term goal: The participant improves quality of Life and PHQ9 Scores as seen by post scores and/or verbalization of changes          Quality of Life Scores:  Quality of Life - 06/04/24 1553       Quality of Life   Select Quality of Life      Quality of Life Scores   Health/Function Pre 14.92 %     Socioeconomic Pre 20.25 %    Psych/Spiritual Pre 15.64 %    Family Pre 26 %    GLOBAL Pre 17.34 %  Scores of 19 and below usually indicate a poorer quality of life in these areas.  A difference of  2-3 points is a clinically meaningful difference.  A difference of 2-3 points in the total score of the Quality of Life Index has been associated with significant improvement in overall quality of life, self-image, physical symptoms, and general health in studies assessing change in quality of life.  PHQ-9: Review Flowsheet       06/04/2024  Depression screen PHQ 2/9  Decreased Interest 1  Down, Depressed, Hopeless 2  PHQ - 2 Score 3  Altered sleeping 1  Tired, decreased energy 1  Change in appetite 3  Feeling bad or failure about yourself  2  Trouble concentrating 2  Moving slowly or fidgety/restless 1  Suicidal thoughts 0  PHQ-9 Score 13  Difficult doing work/chores Somewhat difficult   Interpretation of Total Score  Total Score Depression Severity:  1-4 = Minimal depression, 5-9 = Mild depression, 10-14 = Moderate depression, 15-19 = Moderately severe depression, 20-27 = Severe depression   Psychosocial Evaluation and Intervention:   Psychosocial Re-Evaluation:  Psychosocial Re-Evaluation     Row Name 06/12/24 1323 07/02/24 1157           Psychosocial Re-Evaluation   Current issues with Current Depression;Current Stress Concerns;Current Anxiety/Panic Current Depression;Current Stress Concerns;Current Anxiety/Panic      Comments Tyson did not voice any increased concerns or stressors on her first day of exercise.Will reivew quality of life and PHQ9 in the near future. Quality of life and PHQ9 reviewed. Warda says that she is not as depressed as she has recently started an antidepressant, Gean says that she has a previous negative counseling experience.Dana says that her energy level has improved since she has been participating in cardiac rehab. Kanyia says she feel  stressed at times as she has a poor appetite. Discussed eating small meals during the day as she can tolerate. Offered emotional support and reassurance. Encouraged Burnard to let us  know if she changes her mind and would like counseling.      Expected Outcomes Joe will have controlled or decreased depression, stressors and anxiety. Kayse will have controlled or decreased depression, stressors and anxiety.      Interventions Stress management education;Relaxation education;Encouraged to attend Cardiac Rehabilitation for the exercise Stress management education;Relaxation education;Encouraged to attend Cardiac Rehabilitation for the exercise      Continue Psychosocial Services  Follow up required by staff Follow up required by staff        Initial Review   Source of Stress Concerns Chronic Illness;Unable to participate in former interests or hobbies;Unable to perform yard/household activities Chronic Illness;Unable to participate in former interests or hobbies;Unable to perform yard/household activities      Comments Will continue to monitor and offer support as needed. Will continue to monitor and offer support as needed.         Psychosocial Discharge (Final Psychosocial Re-Evaluation):  Psychosocial Re-Evaluation - 07/02/24 1157       Psychosocial Re-Evaluation   Current issues with Current Depression;Current Stress Concerns;Current Anxiety/Panic    Comments Quality of life and PHQ9 reviewed. Zeriyah says that she is not as depressed as she has recently started an antidepressant, Veneda says that she has a previous negative counseling experience.Tiara says that her energy level has improved since she has been participating in cardiac rehab. Dhamar says she feel stressed at times as she has a poor appetite. Discussed eating small meals during the day as she can  tolerate. Offered emotional support and reassurance. Encouraged Burnard to let us  know if she changes her mind and would like counseling.     Expected Outcomes Naketa will have controlled or decreased depression, stressors and anxiety.    Interventions Stress management education;Relaxation education;Encouraged to attend Cardiac Rehabilitation for the exercise    Continue Psychosocial Services  Follow up required by staff      Initial Review   Source of Stress Concerns Chronic Illness;Unable to participate in former interests or hobbies;Unable to perform yard/household activities    Comments Will continue to monitor and offer support as needed.          Vocational Rehabilitation: Provide vocational rehab assistance to qualifying candidates.   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 She plans to return to work once cleared to do so. No VR needs at this time.          Education: Education Goals: Education classes will be provided on a weekly basis, covering required topics. Participant will state understanding/return demonstration of topics presented.    Education     Row Name 06/12/24 1000     Education   Cardiac Education Topics Pritikin   Orthoptist   Educator Dietitian   Weekly Topic Tasty Appetizers and Snacks   Instruction Review Code 1- Verbalizes Understanding   Class Start Time 1145   Class Stop Time 1225   Class Time Calculation (min) 40 min    Row Name 06/14/24 1000     Education   Cardiac Education Topics Pritikin   Select Core Videos     Core Videos   Educator Dietitian   Select Nutrition   Nutrition Other  Label reading   Instruction Review Code 1- Verbalizes Understanding   Class Start Time 1145   Class Stop Time 1219   Class Time Calculation (min) 34 min    Row Name 06/17/24 1100     Education    Cardiac Education Topics Pritikin   Glass Blower/designer Nutrition   Nutrition Workshop Label Reading   Instruction Review Code 1- Tax Inspector   Class Start Time 1148   Class Stop Time 1225   Class Time Calculation (min) 37 min    Row Name 06/21/24 1000     Education   Cardiac Education Topics Pritikin   Nurse, Children's Exercise Physiologist   Select Psychosocial   Psychosocial Healthy Minds, Bodies, Hearts   Instruction Review Code 1- Verbalizes Understanding   Class Start Time 1144   Class Stop Time 1220   Class Time Calculation (min) 36 min    Row Name 06/24/24 1100     Education   Cardiac Education Topics Pritikin   Engineer, Mining Education   General Education Heart Disease Risk Reduction   Instruction Review Code 1- Verbalizes Understanding   Class Start Time 1153   Class Stop Time 1233   Class Time Calculation (min) 40 min    Row Name 06/26/24 1100     Education  Cardiac Education Topics Pritikin   Customer Service Manager   Weekly Topic Fast and Healthy Breakfasts   Instruction Review Code 1- Verbalizes Understanding   Class Start Time 1145   Class Stop Time 1224   Class Time Calculation (min) 39 min    Row Name 06/28/24 1000     Education   Cardiac Education Topics --   Select --     Workshops   Educator --   Physiological Scientist --   Exercise Workshop --   Instruction Review Code --      Core Videos: Exercise    Move It!  Clinical staff conducted group or individual video education with verbal and written material and guidebook.  Patient learns the recommended Pritikin exercise program. Exercise with the goal of living a long, healthy life. Some of the health benefits of exercise include controlled diabetes, healthier blood pressure levels, improved cholesterol levels,  improved heart and lung capacity, improved sleep, and better body composition. Everyone should speak with their doctor before starting or changing an exercise routine.  Biomechanical Limitations Clinical staff conducted group or individual video education with verbal and written material and guidebook.  Patient learns how biomechanical limitations can impact exercise and how we can mitigate and possibly overcome limitations to have an impactful and balanced exercise routine.  Body Composition Clinical staff conducted group or individual video education with verbal and written material and guidebook.  Patient learns that body composition (ratio of muscle mass to fat mass) is a key component to assessing overall fitness, rather than body weight alone. Increased fat mass, especially visceral belly fat, can put us  at increased risk for metabolic syndrome, type 2 diabetes, heart disease, and even death. It is recommended to combine diet and exercise (cardiovascular and resistance training) to improve your body composition. Seek guidance from your physician and exercise physiologist before implementing an exercise routine.  Exercise Action Plan Clinical staff conducted group or individual video education with verbal and written material and guidebook.  Patient learns the recommended strategies to achieve and enjoy long-term exercise adherence, including variety, self-motivation, self-efficacy, and positive decision making. Benefits of exercise include fitness, good health, weight management, more energy, better sleep, less stress, and overall well-being.  Medical   Heart Disease Risk Reduction Clinical staff conducted group or individual video education with verbal and written material and guidebook.  Patient learns our heart is our most vital organ as it circulates oxygen, nutrients, white blood cells, and hormones throughout the entire body, and carries waste away. Data supports a plant-based eating  plan like the Pritikin Program for its effectiveness in slowing progression of and reversing heart disease. The video provides a number of recommendations to address heart disease.   Metabolic Syndrome and Belly Fat  Clinical staff conducted group or individual video education with verbal and written material and guidebook.  Patient learns what metabolic syndrome is, how it leads to heart disease, and how one can reverse it and keep it from coming back. You have metabolic syndrome if you have 3 of the following 5 criteria: abdominal obesity, high blood pressure, high triglycerides, low HDL cholesterol, and high blood sugar.  Hypertension and Heart Disease Clinical staff conducted group or individual video education with verbal and written material and guidebook.  Patient learns that high blood pressure, or hypertension, is very common in the [place] . Hypertension is largely due to excessive salt intake, but other important risk factors include being  overweight, physical inactivity, drinking too much alcohol, smoking, and not eating enough potassium from fruits and vegetables. High blood pressure is a leading risk factor for heart attack, stroke, congestive heart failure, dementia, kidney failure, and premature death. Long-term effects of excessive salt intake include stiffening of the arteries and thickening of heart muscle and organ damage. Recommendations include ways to reduce hypertension and the risk of heart disease.  Diseases of Our Time - Focusing on Diabetes Clinical staff conducted group or individual video education with verbal and written material and guidebook.  Patient learns why the best way to stop diseases of our time is prevention, through food and other lifestyle changes. Medicine (such as prescription pills and surgeries) is often only a Band-Aid on the problem, not a long-term solution. Most common diseases of our time include obesity, type 2 diabetes, hypertension, heart  disease, and cancer. The Pritikin Program is recommended and has been proven to help reduce, reverse, and/or prevent the damaging effects of metabolic syndrome.  Nutrition   Overview of the Pritikin Eating Plan  Clinical staff conducted group or individual video education with verbal and written material and guidebook.  Patient learns about the Pritikin Eating Plan for disease risk reduction. The Pritikin Eating Plan emphasizes a wide variety of unrefined, minimally-processed carbohydrates, like fruits, vegetables, whole grains, and legumes. Go, Caution, and Stop food choices are explained. Plant-based and lean animal proteins are emphasized. Rationale provided for low sodium intake for blood pressure control, low added sugars for blood sugar stabilization, and low added fats and oils for coronary artery disease risk reduction and weight management.  Calorie Density  Clinical staff conducted group or individual video education with verbal and written material and guidebook.  Patient learns about calorie density and how it impacts the Pritikin Eating Plan. Knowing the characteristics of the food you choose will help you decide whether those foods will lead to weight gain or weight loss, and whether you want to consume more or less of them. Weight loss is usually a side effect of the Pritikin Eating Plan because of its focus on low calorie-dense foods.  Label Reading  Clinical staff conducted group or individual video education with verbal and written material and guidebook.  Patient learns about the Pritikin recommended label reading guidelines and corresponding recommendations regarding calorie density, added sugars, sodium content, and whole grains.  Dining Out - Part 1  Clinical staff conducted group or individual video education with verbal and written material and guidebook.  Patient learns that restaurant meals can be sabotaging because they can be so high in calories, fat, sodium, and/or  sugar. Patient learns recommended strategies on how to positively address this and avoid unhealthy pitfalls.  Facts on Fats  Clinical staff conducted group or individual video education with verbal and written material and guidebook.  Patient learns that lifestyle modifications can be just as effective, if not more so, as many medications for lowering your risk of heart disease. A Pritikin lifestyle can help to reduce your risk of inflammation and atherosclerosis (cholesterol build-up, or plaque, in the artery walls). Lifestyle interventions such as dietary choices and physical activity address the cause of atherosclerosis. A review of the types of fats and their impact on blood cholesterol levels, along with dietary recommendations to reduce fat intake is also included.  Nutrition Action Plan  Clinical staff conducted group or individual video education with verbal and written material and guidebook.  Patient learns how to incorporate Pritikin recommendations into their lifestyle. Recommendations include  planning and keeping personal health goals in mind as an important part of their success.  Healthy Mind-Set    Healthy Minds, Bodies, Hearts  Clinical staff conducted group or individual video education with verbal and written material and guidebook.  Patient learns how to identify when they are stressed. Video will discuss the impact of that stress, as well as the many benefits of stress management. Patient will also be introduced to stress management techniques. The way we think, act, and feel has an impact on our hearts.  How Our Thoughts Can Heal Our Hearts  Clinical staff conducted group or individual video education with verbal and written material and guidebook.  Patient learns that negative thoughts can cause depression and anxiety. This can result in negative lifestyle behavior and serious health problems. Cognitive behavioral therapy is an effective method to help control our thoughts in  order to change and improve our emotional outlook.  Additional Videos:  Exercise    Improving Performance  Clinical staff conducted group or individual video education with verbal and written material and guidebook.  Patient learns to use a non-linear approach by alternating intensity levels and lengths of time spent exercising to help burn more calories and lose more body fat. Cardiovascular exercise helps improve heart health, metabolism, hormonal balance, blood sugar control, and recovery from fatigue. Resistance training improves strength, endurance, balance, coordination, reaction time, metabolism, and muscle mass. Flexibility exercise improves circulation, posture, and balance. Seek guidance from your physician and exercise physiologist before implementing an exercise routine and learn your capabilities and proper form for all exercise.  Introduction to Yoga  Clinical staff conducted group or individual video education with verbal and written material and guidebook.  Patient learns about yoga, a discipline of the coming together of mind, breath, and body. The benefits of yoga include improved flexibility, improved range of motion, better posture and core strength, increased lung function, weight loss, and positive self-image. Yogas heart health benefits include lowered blood pressure, healthier heart rate, decreased cholesterol and triglyceride levels, improved immune function, and reduced stress. Seek guidance from your physician and exercise physiologist before implementing an exercise routine and learn your capabilities and proper form for all exercise.  Medical   Aging: Enhancing Your Quality of Life  Clinical staff conducted group or individual video education with verbal and written material and guidebook.  Patient learns key strategies and recommendations to stay in good physical health and enhance quality of life, such as prevention strategies, having an advocate, securing a Health  Care Proxy and Power of Attorney, and keeping a list of medications and system for tracking them. It also discusses how to avoid risk for bone loss.  Biology of Weight Control  Clinical staff conducted group or individual video education with verbal and written material and guidebook.  Patient learns that weight gain occurs because we consume more calories than we burn (eating more, moving less). Even if your body weight is normal, you may have higher ratios of fat compared to muscle mass. Too much body fat puts you at increased risk for cardiovascular disease, heart attack, stroke, type 2 diabetes, and obesity-related cancers. In addition to exercise, following the Pritikin Eating Plan can help reduce your risk.  Decoding Lab Results  Clinical staff conducted group or individual video education with verbal and written material and guidebook.  Patient learns that lab test reflects one measurement whose values change over time and are influenced by many factors, including medication, stress, sleep, exercise, food, hydration, pre-existing medical  conditions, and more. It is recommended to use the knowledge from this video to become more involved with your lab results and evaluate your numbers to speak with your doctor.   Diseases of Our Time - Overview  Clinical staff conducted group or individual video education with verbal and written material and guidebook.  Patient learns that according to the CDC, 50% to 70% of chronic diseases (such as obesity, type 2 diabetes, elevated lipids, hypertension, and heart disease) are avoidable through lifestyle improvements including healthier food choices, listening to satiety cues, and increased physical activity.  Sleep Disorders Clinical staff conducted group or individual video education with verbal and written material and guidebook.  Patient learns how good quality and duration of sleep are important to overall health and well-being. Patient also learns  about sleep disorders and how they impact health along with recommendations to address them, including discussing with a physician.  Nutrition  Dining Out - Part 2 Clinical staff conducted group or individual video education with verbal and written material and guidebook.  Patient learns how to plan ahead and communicate in order to maximize their dining experience in a healthy and nutritious manner. Included are recommended food choices based on the type of restaurant the patient is visiting.   Fueling a Banker conducted group or individual video education with verbal and written material and guidebook.  There is a strong connection between our food choices and our health. Diseases like obesity and type 2 diabetes are very prevalent and are in large-part due to lifestyle choices. The Pritikin Eating Plan provides plenty of food and hunger-curbing satisfaction. It is easy to follow, affordable, and helps reduce health risks.  Menu Workshop  Clinical staff conducted group or individual video education with verbal and written material and guidebook.  Patient learns that restaurant meals can sabotage health goals because they are often packed with calories, fat, sodium, and sugar. Recommendations include strategies to plan ahead and to communicate with the manager, chef, or server to help order a healthier meal.  Planning Your Eating Strategy  Clinical staff conducted group or individual video education with verbal and written material and guidebook.  Patient learns about the Pritikin Eating Plan and its benefit of reducing the risk of disease. The Pritikin Eating Plan does not focus on calories. Instead, it emphasizes high-quality, nutrient-rich foods. By knowing the characteristics of the foods, we choose, we can determine their calorie density and make informed decisions.  Targeting Your Nutrition Priorities  Clinical staff conducted group or individual video education with  verbal and written material and guidebook.  Patient learns that lifestyle habits have a tremendous impact on disease risk and progression. This video provides eating and physical activity recommendations based on your personal health goals, such as reducing LDL cholesterol, losing weight, preventing or controlling type 2 diabetes, and reducing high blood pressure.  Vitamins and Minerals  Clinical staff conducted group or individual video education with verbal and written material and guidebook.  Patient learns different ways to obtain key vitamins and minerals, including through a recommended healthy diet. It is important to discuss all supplements you take with your doctor.   Healthy Mind-Set    Smoking Cessation  Clinical staff conducted group or individual video education with verbal and written material and guidebook.  Patient learns that cigarette smoking and tobacco addiction pose a serious health risk which affects millions of people. Stopping smoking will significantly reduce the risk of heart disease, lung disease, and many forms  of cancer. Recommended strategies for quitting are covered, including working with your doctor to develop a successful plan.  Culinary   Becoming a Set Designer conducted group or individual video education with verbal and written material and guidebook.  Patient learns that cooking at home can be healthy, cost-effective, quick, and puts them in control. Keys to cooking healthy recipes will include looking at your recipe, assessing your equipment needs, planning ahead, making it simple, choosing cost-effective seasonal ingredients, and limiting the use of added fats, salts, and sugars.  Cooking - Breakfast and Snacks  Clinical staff conducted group or individual video education with verbal and written material and guidebook.  Patient learns how important breakfast is to satiety and nutrition through the entire day. Recommendations include key  foods to eat during breakfast to help stabilize blood sugar levels and to prevent overeating at meals later in the day. Planning ahead is also a key component.  Cooking - Educational Psychologist conducted group or individual video education with verbal and written material and guidebook.  Patient learns eating strategies to improve overall health, including an approach to cook more at home. Recommendations include thinking of animal protein as a side on your plate rather than center stage and focusing instead on lower calorie dense options like vegetables, fruits, whole grains, and plant-based proteins, such as beans. Making sauces in large quantities to freeze for later and leaving the skin on your vegetables are also recommended to maximize your experience.  Cooking - Healthy Salads and Dressing Clinical staff conducted group or individual video education with verbal and written material and guidebook.  Patient learns that vegetables, fruits, whole grains, and legumes are the foundations of the Pritikin Eating Plan. Recommendations include how to incorporate each of these in flavorful and healthy salads, and how to create homemade salad dressings. Proper handling of ingredients is also covered. Cooking - Soups and [employer] - Soups and Desserts Clinical staff conducted group or individual video education with verbal and written material and guidebook.  Patient learns that Pritikin soups and desserts make for easy, nutritious, and delicious snacks and meal components that are low in sodium, fat, sugar, and calorie density, while high in vitamins, minerals, and filling fiber. Recommendations include simple and healthy ideas for soups and desserts.   Overview     The Pritikin Solution Program Overview Clinical staff conducted group or individual video education with verbal and written material and guidebook.  Patient learns that the results of the Pritikin Program have been  documented in more than 100 articles published in peer-reviewed journals, and the benefits include reducing risk factors for (and, in some cases, even reversing) high cholesterol, high blood pressure, type 2 diabetes, obesity, and more! An overview of the three key pillars of the Pritikin Program will be covered: eating well, doing regular exercise, and having a healthy mind-set.  WORKSHOPS  Exercise: Exercise Basics: Building Your Action Plan Clinical staff led group instruction and group discussion with PowerPoint presentation and patient guidebook. To enhance the learning environment the use of posters, models and videos may be added. At the conclusion of this workshop, patients will comprehend the difference between physical activity and exercise, as well as the benefits of incorporating both, into their routine. Patients will understand the FITT (Frequency, Intensity, Time, and Type) principle and how to use it to build an exercise action plan. In addition, safety concerns and other considerations for exercise and cardiac rehab will be addressed  by the presenter. The purpose of this lesson is to promote a comprehensive and effective weekly exercise routine in order to improve patients overall level of fitness.   Managing Heart Disease: Your Path to a Healthier Heart Clinical staff led group instruction and group discussion with PowerPoint presentation and patient guidebook. To enhance the learning environment the use of posters, models and videos may be added.At the conclusion of this workshop, patients will understand the anatomy and physiology of the heart. Additionally, they will understand how Pritikins three pillars impact the risk factors, the progression, and the management of heart disease.  The purpose of this lesson is to provide a high-level overview of the heart, heart disease, and how the Pritikin lifestyle positively impacts risk factors.  Exercise Biomechanics Clinical  staff led group instruction and group discussion with PowerPoint presentation and patient guidebook. To enhance the learning environment the use of posters, models and videos may be added. Patients will learn how the structural parts of their bodies function and how these functions impact their daily activities, movement, and exercise. Patients will learn how to promote a neutral spine, learn how to manage pain, and identify ways to improve their physical movement in order to promote healthy living. The purpose of this lesson is to expose patients to common physical limitations that impact physical activity. Participants will learn practical ways to adapt and manage aches and pains, and to minimize their effect on regular exercise. Patients will learn how to maintain good posture while sitting, walking, and lifting.  Balance Training and Fall Prevention  Clinical staff led group instruction and group discussion with PowerPoint presentation and patient guidebook. To enhance the learning environment the use of posters, models and videos may be added. At the conclusion of this workshop, patients will understand the importance of their sensorimotor skills (vision, proprioception, and the vestibular system) in maintaining their ability to balance as they age. Patients will apply a variety of balancing exercises that are appropriate for their current level of function. Patients will understand the common causes for poor balance, possible solutions to these problems, and ways to modify their physical environment in order to minimize their fall risk. The purpose of this lesson is to teach patients about the importance of maintaining balance as they age and ways to minimize their risk of falling.  WORKSHOPS   Nutrition:  Fueling a Ship Broker led group instruction and group discussion with PowerPoint presentation and patient guidebook. To enhance the learning environment the use of  posters, models and videos may be added. Patients will review the foundational principles of the Pritikin Eating Plan and understand what constitutes a serving size in each of the food groups. Patients will also learn Pritikin-friendly foods that are better choices when away from home and review make-ahead meal and snack options. Calorie density will be reviewed and applied to three nutrition priorities: weight maintenance, weight loss, and weight gain. The purpose of this lesson is to reinforce (in a group setting) the key concepts around what patients are recommended to eat and how to apply these guidelines when away from home by planning and selecting Pritikin-friendly options. Patients will understand how calorie density may be adjusted for different weight management goals.  Mindful Eating  Clinical staff led group instruction and group discussion with PowerPoint presentation and patient guidebook. To enhance the learning environment the use of posters, models and videos may be added. Patients will briefly review the concepts of the Pritikin Eating Plan and the importance  of low-calorie dense foods. The concept of mindful eating will be introduced as well as the importance of paying attention to internal hunger signals. Triggers for non-hunger eating and techniques for dealing with triggers will be explored. The purpose of this lesson is to provide patients with the opportunity to review the basic principles of the Pritikin Eating Plan, discuss the value of eating mindfully and how to measure internal cues of hunger and fullness using the Hunger Scale. Patients will also discuss reasons for non-hunger eating and learn strategies to use for controlling emotional eating.  Targeting Your Nutrition Priorities Clinical staff led group instruction and group discussion with PowerPoint presentation and patient guidebook. To enhance the learning environment the use of posters, models and videos may be added.  Patients will learn how to determine their genetic susceptibility to disease by reviewing their family history. Patients will gain insight into the importance of diet as part of an overall healthy lifestyle in mitigating the impact of genetics and other environmental insults. The purpose of this lesson is to provide patients with the opportunity to assess their personal nutrition priorities by looking at their family history, their own health history and current risk factors. Patients will also be able to discuss ways of prioritizing and modifying the Pritikin Eating Plan for their highest risk areas  Menu  Clinical staff led group instruction and group discussion with PowerPoint presentation and patient guidebook. To enhance the learning environment the use of posters, models and videos may be added. Using menus brought in from [employer], or printed from [employer], patients will apply the Pritikin dining out guidelines that were presented in the [employer] video. Patients will also be able to practice these guidelines in a variety of provided scenarios. The purpose of this lesson is to provide patients with the opportunity to practice hands-on learning of the Pritikin Dining Out guidelines with actual menus and practice scenarios.  Label Reading Clinical staff led group instruction and group discussion with PowerPoint presentation and patient guidebook. To enhance the learning environment the use of posters, models and videos may be added. Patients will review and discuss the Pritikin label reading guidelines presented in Pritikins Label Reading Educational series video. Using fool labels brought in from local grocery stores and markets, patients will apply the label reading guidelines and determine if the packaged food meet the Pritikin guidelines. The purpose of this lesson is to provide patients with the opportunity to review, discuss, and practice hands-on learning of the  Pritikin Label Reading guidelines with actual packaged food labels. Cooking School  Pritikins [employer] are designed to teach patients ways to prepare quick, simple, and affordable recipes at home. The importance of nutritions role in chronic disease risk reduction is reflected in its emphasis in the overall Pritikin program. By learning how to prepare essential core [employer] Eating Plan recipes, patients will increase control over what they eat; be able to customize the flavor of foods without the use of added salt, sugar, or fat; and improve the quality of the food they consume. By learning a set of core recipes which are easily assembled, quickly prepared, and affordable, patients are more likely to prepare more healthy foods at home. These workshops focus on convenient breakfasts, simple entres, side dishes, and desserts which can be prepared with minimal effort and are consistent with nutrition recommendations for cardiovascular risk reduction. Cooking [employer] are taught by a armed forces logistics/support/administrative officer (RD) who has been trained by the  [employer] Designer, Industrial/product. The chef or RD has a clear understanding of the importance of minimizing - if not completely eliminating - added fat, sugar, and sodium in recipes. Throughout the series of Cooking School Workshop sessions, patients will learn about healthy ingredients and efficient methods of cooking to build confidence in their capability to prepare    Cooking School weekly topics:  Adding Flavor- Sodium-Free  Fast and Healthy Breakfasts  Powerhouse Plant-Based Proteins  Satisfying Salads and Dressings  Simple Sides and Sauces  International Cuisine-Spotlight on the [employer] Zones  Delicious Desserts  Savory Soups  [employer] - Meals in a Astronomer Appetizers and Snacks  Comforting Weekend Breakfasts  One-Pot Wonders   Fast Evening Meals  Landscape Architect Your Pritikin Plate  WORKSHOPS    Healthy Mindset (Psychosocial):  Focused Goals, Sustainable Changes Clinical staff led group instruction and group discussion with PowerPoint presentation and patient guidebook. To enhance the learning environment the use of posters, models and videos may be added. Patients will be able to apply effective goal setting strategies to establish at least one personal goal, and then take consistent, meaningful action toward that goal. They will learn to identify common barriers to achieving personal goals and develop strategies to overcome them. Patients will also gain an understanding of how our mind-set can impact our ability to achieve goals and the importance of cultivating a positive and growth-oriented mind-set. The purpose of this lesson is to provide patients with a deeper understanding of how to set and achieve personal goals, as well as the tools and strategies needed to overcome common obstacles which may arise along the way.  From Head to Heart: The Power of a Healthy Outlook  Clinical staff led group instruction and group discussion with PowerPoint presentation and patient guidebook. To enhance the learning environment the use of posters, models and videos may be added. Patients will be able to recognize and describe the impact of emotions and mood on physical health. They will discover the importance of self-care and explore self-care practices which may work for them. Patients will also learn how to utilize the 4 Cs to cultivate a healthier outlook and better manage stress and challenges. The purpose of this lesson is to demonstrate to patients how a healthy outlook is an essential part of maintaining good health, especially as they continue their cardiac rehab journey.  Healthy Sleep for a Healthy Heart Clinical staff led group instruction and group discussion with PowerPoint presentation and patient guidebook. To enhance the learning environment the use of posters, models and videos may be  added. At the conclusion of this workshop, patients will be able to demonstrate knowledge of the importance of sleep to overall health, well-being, and quality of life. They will understand the symptoms of, and treatments for, common sleep disorders. Patients will also be able to identify daytime and nighttime behaviors which impact sleep, and they will be able to apply these tools to help manage sleep-related challenges. The purpose of this lesson is to provide patients with a general overview of sleep and outline the importance of quality sleep. Patients will learn about a few of the most common sleep disorders. Patients will also be introduced to the concept of sleep hygiene, and discover ways to self-manage certain sleeping problems through simple daily behavior changes. Finally, the workshop will motivate patients by clarifying the links between quality sleep and their goals of heart-healthy living.   Recognizing and Reducing Stress Clinical staff led group instruction  and group discussion with PowerPoint presentation and patient guidebook. To enhance the learning environment the use of posters, models and videos may be added. At the conclusion of this workshop, patients will be able to understand the types of stress reactions, differentiate between acute and chronic stress, and recognize the impact that chronic stress has on their health. They will also be able to apply different coping mechanisms, such as reframing negative self-talk. Patients will have the opportunity to practice a variety of stress management techniques, such as deep abdominal breathing, progressive muscle relaxation, and/or guided imagery.  The purpose of this lesson is to educate patients on the role of stress in their lives and to provide healthy techniques for coping with it.  Learning Barriers/Preferences:  Learning Barriers/Preferences - 06/04/24 1457       Learning Barriers/Preferences   Learning Barriers Hearing;Reading    dyslexia makes reading challenging   Learning Preferences Computer/Internet;Group Instruction;Individual Instruction;Video          Education Topics:  Knowledge Questionnaire Score:  Knowledge Questionnaire Score - 06/04/24 1554       Knowledge Questionnaire Score   Pre Score 24/24          Core Components/Risk Factors/Patient Goals at Admission:  Personal Goals and Risk Factors at Admission - 06/04/24 1458       Core Components/Risk Factors/Patient Goals on Admission    Weight Management Yes;Weight Gain    Intervention Weight Management: Develop a combined nutrition and exercise program designed to reach desired caloric intake, while maintaining appropriate intake of nutrient and fiber, sodium and fats, and appropriate energy expenditure required for the weight goal.;Weight Management: Provide education and appropriate resources to help participant work on and attain dietary goals.    Admit Weight 103 lb 2.8 oz (46.8 kg)    Expected Outcomes Short Term: Continue to assess and modify interventions until short term weight is achieved;Long Term: Adherence to nutrition and physical activity/exercise program aimed toward attainment of established weight goal;Weight Gain: Understanding of general recommendations for a high calorie, high protein meal plan that promotes weight gain by distributing calorie intake throughout the day with the consumption for 4-5 meals, snacks, and/or supplements    Lipids Yes    Intervention Provide education and support for participant on nutrition & aerobic/resistive exercise along with prescribed medications to achieve LDL 70mg , HDL >40mg .    Expected Outcomes Short Term: Participant states understanding of desired cholesterol values and is compliant with medications prescribed. Participant is following exercise prescription and nutrition guidelines.;Long Term: Cholesterol controlled with medications as prescribed, with individualized exercise RX and with  personalized nutrition plan. Value goals: LDL < 70mg , HDL > 40 mg.    Stress Yes    Intervention Offer individual and/or small group education and counseling on adjustment to heart disease, stress management and health-related lifestyle change. Teach and support self-help strategies.;Refer participants experiencing significant psychosocial distress to appropriate mental health specialists for further evaluation and treatment. When possible, include family members and significant others in education/counseling sessions.    Expected Outcomes Short Term: Participant demonstrates changes in health-related behavior, relaxation and other stress management skills, ability to obtain effective social support, and compliance with psychotropic medications if prescribed.;Long Term: Emotional wellbeing is indicated by absence of clinically significant psychosocial distress or social isolation.          Core Components/Risk Factors/Patient Goals Review:   Goals and Risk Factor Review     Row Name 06/13/24 1501 07/02/24 1159  Core Components/Risk Factors/Patient Goals Review   Personal Goals Review Weight Management/Obesity;Lipids;Stress Weight Management/Obesity;Lipids;Stress      Review Vanda started cardiac rehab on 06/14/23. Received parameters from the heart failure clinic okay to exercise for bp greater than 80/50. Vital signs were stable.Sadiyah is deconditioned. Hether did well with exercise for her fitness level. Illyria started cardiac rehab on 06/14/23. Amber is off to a good start to exercise and has increased her met levels. Melinna's weight has remained unchanged.  Milah's systolic Bp's remain in the low 90's to upper 80's. Will continue to monitor BP.      Expected Outcomes Sai will continue to participate in cardiac rehab for exercise, nutrition and lifestyle modifications. Nandini will continue to participate in cardiac rehab for exercise, nutrition and lifestyle modifications.          Core Components/Risk Factors/Patient Goals at Discharge (Final Review):   Goals and Risk Factor Review - 07/02/24 1159       Core Components/Risk Factors/Patient Goals Review   Personal Goals Review Weight Management/Obesity;Lipids;Stress    Review Jannell started cardiac rehab on 06/14/23. Georgetta is off to a good start to exercise and has increased her met levels. Zuley's weight has remained unchanged.  Binnie's systolic Bp's remain in the low 90's to upper 80's. Will continue to monitor BP.    Expected Outcomes Cena will continue to participate in cardiac rehab for exercise, nutrition and lifestyle modifications.          ITP Comments:  ITP Comments     Row Name 06/04/24 1307 06/12/24 1319 07/02/24 1157       ITP Comments Medical Director- Dr. Wilbert Bihari, MD. Introduction to the [facility] Education/ Intensive Cardiac Rehab Program. Reviewed initial orientation folder with Burnard. 30 Day ITP Review. Clydette started cardiac rehab on 06/13/23. Elexa did well with exercise for her fitness level. 30 Day ITP Review. Nehal started cardiac rehab on 06/13/23. Aaralynn is off to a good start  with exercise for her fitness level.        Comments: See ITP Comments    [1]  Current Outpatient Medications:    aspirin  81 MG chewable tablet, Chew 81 mg by mouth daily., Disp: , Rfl:    budesonide  (ENTOCORT EC ) 3 MG 24 hr capsule, Take 3 mg by mouth., Disp: , Rfl:    Cholecalciferol (VITAMIN D3) 125 MCG (5000 UT) CAPS, Take 5,000 Units by mouth daily. , Disp: , Rfl:    cyanocobalamin  2000 MCG tablet, Take 2,000 mcg by mouth daily., Disp: , Rfl:    folic acid  (FOLVITE ) 1 MG tablet, Take 1 tablet (1 mg total) by mouth daily., Disp: , Rfl:    levothyroxine  (SYNTHROID ) 100 MCG tablet, Take 100 mcg by mouth daily., Disp: , Rfl:    metoprolol succinate (TOPROL-XL) 25 MG 24 hr tablet, Take 25 mg by mouth daily., Disp: , Rfl:    Multiple Vitamin (MULTIVITAMIN) tablet, Take 1 tablet by mouth daily., Disp: ,  Rfl:    nitroGLYCERIN  (NITROSTAT ) 0.4 MG SL tablet, Place 1 tablet (0.4 mg total) under the tongue every 5 (five) minutes x 3 doses as needed for chest pain (if no relief after 3rd dose, proceed to ED or call 911)., Disp: 25 tablet, Rfl: 1   NURTEC 75 MG TBDP, TAKE 1 TABLET (75 MG TOTAL) BY MOUTH EVERY OTHER DAY (Patient taking differently: Take 75 mg by mouth as needed (for migraines).), Disp: 16 tablet, Rfl: 3   ondansetron  (ZOFRAN -ODT) 4 MG disintegrating tablet, Take  1 tablet (4 mg total) by mouth every 8 (eight) hours as needed for nausea or vomiting., Disp: 10 tablet, Rfl: 0   pantoprazole  (PROTONIX ) 40 MG tablet, Take 1 tablet (40 mg total) by mouth daily., Disp: 90 tablet, Rfl: 3   prasugrel  (EFFIENT ) 10 MG TABS tablet, Take 60 mg (6 tabs) first day then 10 mg (1 tab) Daily, Disp: 36 tablet, Rfl: 3   rosuvastatin (CRESTOR) 20 MG tablet, 1 tablet Orally Once a day for 90 days for cholesterol (Patient not taking: Reported on 06/04/2024), Disp: , Rfl:    topiramate  (TOPAMAX ) 100 MG tablet, Take 100 mg by mouth 2 (two) times daily., Disp: , Rfl:  [2]  Social History Tobacco Use  Smoking Status Some Days   Current packs/day: 0.00   Types: Cigarettes   Last attempt to quit: 02/20/2016   Years since quitting: 8.3  Smokeless Tobacco Never

## 2024-07-03 ENCOUNTER — Encounter (HOSPITAL_COMMUNITY)

## 2024-07-03 ENCOUNTER — Telehealth (HOSPITAL_COMMUNITY): Payer: Self-pay

## 2024-07-03 NOTE — Telephone Encounter (Signed)
 Patient c/o for 10:15 CR class due to icy roads.

## 2024-07-03 NOTE — Telephone Encounter (Signed)
 FMLA paper work Arboriculturist to patient at her request

## 2024-07-05 ENCOUNTER — Encounter (HOSPITAL_COMMUNITY)

## 2024-07-08 ENCOUNTER — Encounter (HOSPITAL_COMMUNITY)

## 2024-07-10 ENCOUNTER — Encounter (HOSPITAL_COMMUNITY)

## 2024-07-10 ENCOUNTER — Telehealth (HOSPITAL_COMMUNITY): Payer: Self-pay

## 2024-07-10 NOTE — Telephone Encounter (Signed)
 Patient c/o due to icy roads.

## 2024-07-12 ENCOUNTER — Telehealth (HOSPITAL_COMMUNITY): Payer: Self-pay

## 2024-07-12 ENCOUNTER — Encounter (HOSPITAL_COMMUNITY): Admission: RE | Admit: 2024-07-12 | Source: Ambulatory Visit

## 2024-07-12 NOTE — Progress Notes (Signed)
 Post exercise BP 80/53 sitting. Standing 80/56 post exercise. . Patient asymptomatic . Medications reviewed. Tasking as prescribed. Ramaya is taking metoprolol.  Entry blood pressure 90/52 telemetry rhythm Sinus rate 82. Repeat Blood pressure 81/57 sitting. Standing BP 78/60. Patient was given graham cracker/ saltines and ginger ale. The heart failure clinic was called and notified about today's BP's spoke with Jamsmine. Onsite provider Orren Fabry [facility] also notified about today's BP's. Exit recheck BP 81/57 sitting. Darianny left cardiac rehab without further complaints or symptoms. Will continue to monitor the patient throughout  the program. Dr Starla office will contact the patient if any medication adjustments are made.  [name] Elpidio Quan RN BSN

## 2024-07-12 NOTE — Telephone Encounter (Signed)
 Patient notified

## 2024-07-12 NOTE — Telephone Encounter (Addendum)
 Advanced Heart Failure Triage Encounter  Patient Name: Angelica Stone  Date of Call: 07/12/24  Problem:  [name] from Cardiac rehab reports patients blood pressure  low. Patient is currently taking metoprolol 25 mg daily. Patient wants to know if she should continue taking this. Patient denies dizziness.   BP Today   Sitting 81/60 Standing 78/60   Plan:  Sent to provider for further review    Rolin LOISE Height, CMA

## 2024-07-15 ENCOUNTER — Encounter (HOSPITAL_COMMUNITY)

## 2024-07-17 ENCOUNTER — Encounter (HOSPITAL_COMMUNITY)

## 2024-07-19 ENCOUNTER — Encounter (HOSPITAL_COMMUNITY)

## 2024-07-22 ENCOUNTER — Encounter (HOSPITAL_COMMUNITY)

## 2024-07-24 ENCOUNTER — Encounter (HOSPITAL_COMMUNITY)

## 2024-07-26 ENCOUNTER — Encounter (HOSPITAL_COMMUNITY)

## 2024-07-29 ENCOUNTER — Ambulatory Visit (HOSPITAL_COMMUNITY): Admitting: Cardiology

## 2024-07-29 ENCOUNTER — Other Ambulatory Visit (HOSPITAL_COMMUNITY)

## 2024-07-29 ENCOUNTER — Encounter (HOSPITAL_COMMUNITY)

## 2024-07-31 ENCOUNTER — Encounter (HOSPITAL_COMMUNITY)

## 2024-08-02 ENCOUNTER — Encounter (HOSPITAL_COMMUNITY)

## 2024-08-05 ENCOUNTER — Encounter (HOSPITAL_COMMUNITY)

## 2024-08-07 ENCOUNTER — Encounter (HOSPITAL_COMMUNITY)

## 2024-08-09 ENCOUNTER — Encounter (HOSPITAL_COMMUNITY)

## 2024-08-12 ENCOUNTER — Encounter (HOSPITAL_COMMUNITY)

## 2024-08-14 ENCOUNTER — Encounter (HOSPITAL_COMMUNITY)

## 2024-08-16 ENCOUNTER — Encounter (HOSPITAL_COMMUNITY)

## 2024-08-19 ENCOUNTER — Encounter (HOSPITAL_COMMUNITY)

## 2024-08-21 ENCOUNTER — Encounter (HOSPITAL_COMMUNITY)

## 2024-08-23 ENCOUNTER — Encounter (HOSPITAL_COMMUNITY)

## 2024-08-26 ENCOUNTER — Encounter (HOSPITAL_COMMUNITY)

## 2024-08-28 ENCOUNTER — Encounter (HOSPITAL_COMMUNITY)

## 2024-10-14 ENCOUNTER — Ambulatory Visit: Admitting: Neurology
